# Patient Record
Sex: Female | Born: 1945
Health system: Southern US, Community
[De-identification: ages and names within clinical notes are randomized; demographics above are authoritative.]

## PROBLEM LIST (undated history)

## (undated) DIAGNOSIS — I10 Essential (primary) hypertension: Secondary | ICD-10-CM

## (undated) DIAGNOSIS — J45909 Unspecified asthma, uncomplicated: Secondary | ICD-10-CM

## (undated) DIAGNOSIS — I639 Cerebral infarction, unspecified: Secondary | ICD-10-CM

## (undated) DIAGNOSIS — E119 Type 2 diabetes mellitus without complications: Secondary | ICD-10-CM

## (undated) HISTORY — DX: Cerebral infarction, unspecified: I63.9

## (undated) HISTORY — DX: Type 2 diabetes mellitus without complications: E11.9

## (undated) HISTORY — DX: Unspecified asthma, uncomplicated: J45.909

## (undated) HISTORY — DX: Essential (primary) hypertension: I10

---

## 1998-06-16 HISTORY — PX: TOTAL HIP ARTHROPLASTY: SHX124

## 2000-06-16 HISTORY — PX: TOTAL HIP ARTHROPLASTY: SHX124

## 2004-05-16 ENCOUNTER — Ambulatory Visit: Payer: Self-pay

## 2009-03-16 ENCOUNTER — Ambulatory Visit: Payer: Self-pay | Admitting: Internal Medicine

## 2009-05-05 ENCOUNTER — Inpatient Hospital Stay: Payer: Self-pay | Admitting: Unknown Physician Specialty

## 2009-05-05 IMAGING — CR PELVIS - 1-2 VIEW
1 series · 2 of 2 positions shown · non-contrast
Comparison: none

REASON FOR EXAM: FALL, RIGHT HIP PAIN
COMMENTS:

PROCEDURE:     DXR - DXR PELVIS AP ONLY  - [DATE]  [DATE]
RESULT:     The patient is status subcapital femoral neck fracture on the
right. A left-sided prosthesis is appreciated. No further fractures or
dislocation are appreciated.

[Series 1: view not recorded · 0.17mm/px · 2 of 2 slices shown]
[im 1/2]
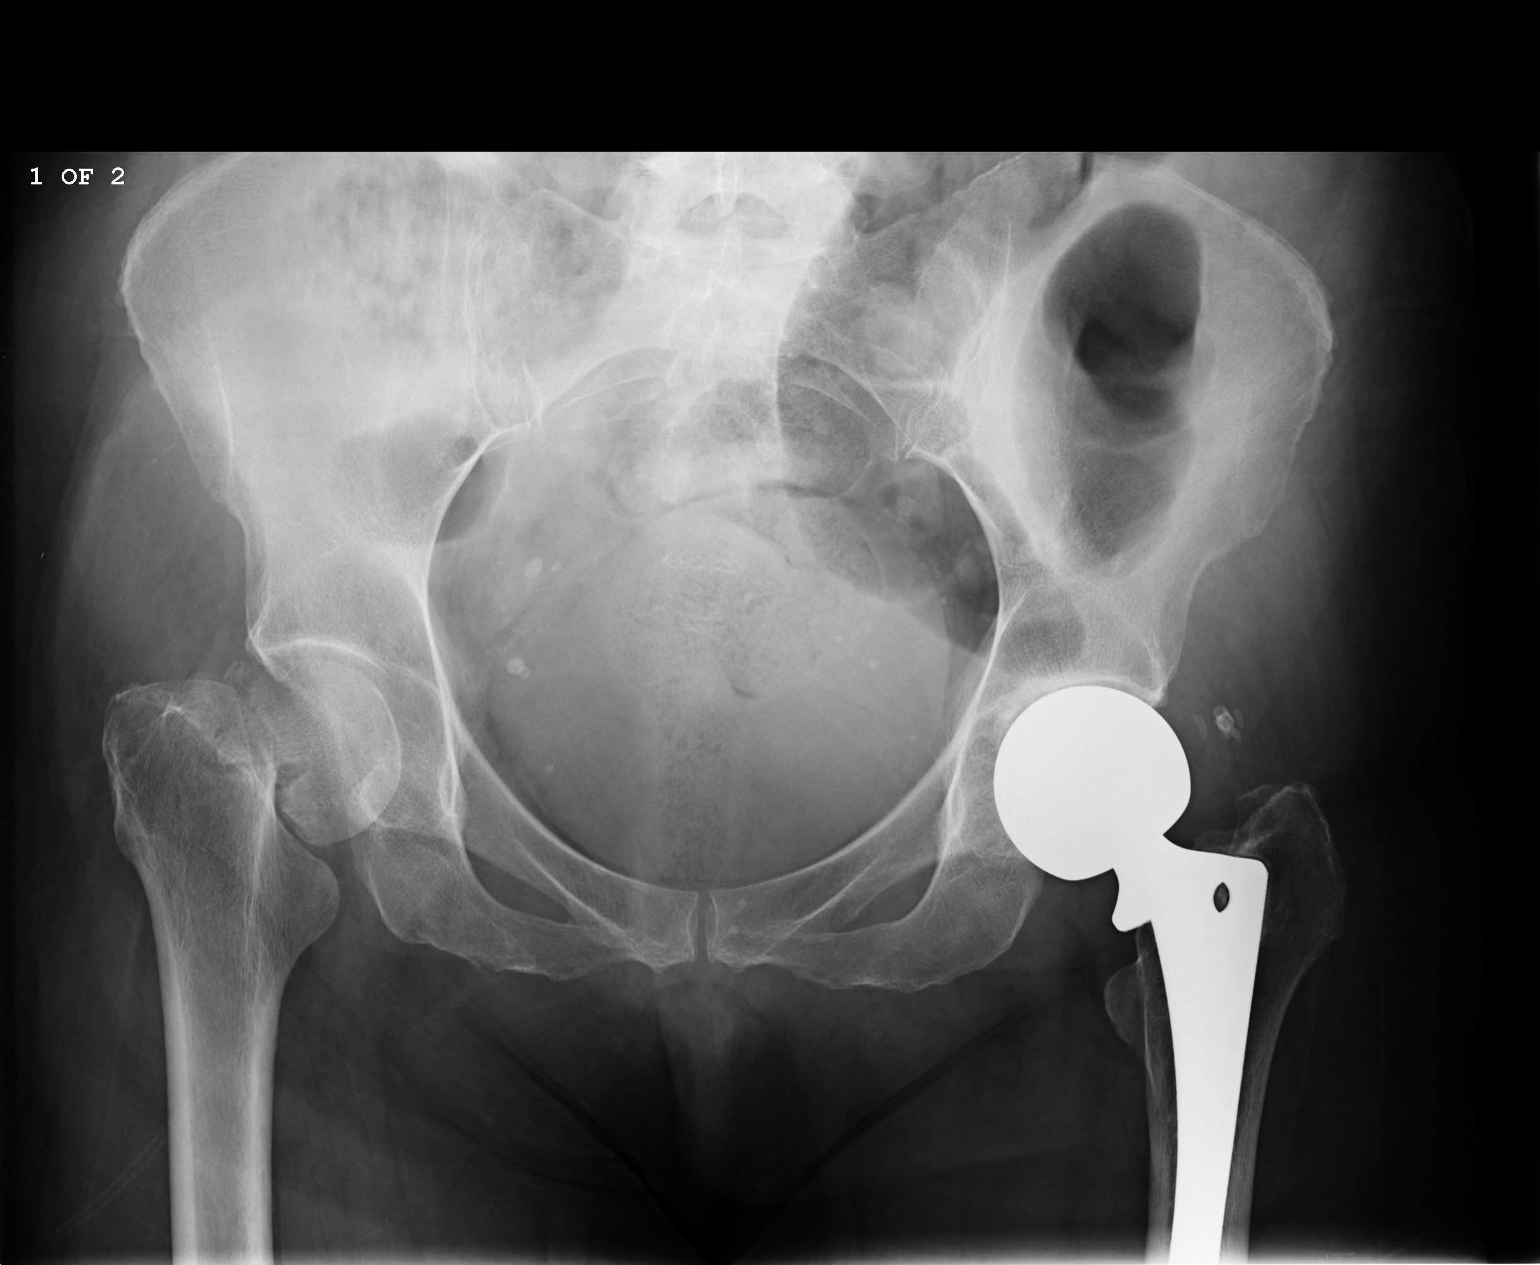
[im 2/2]
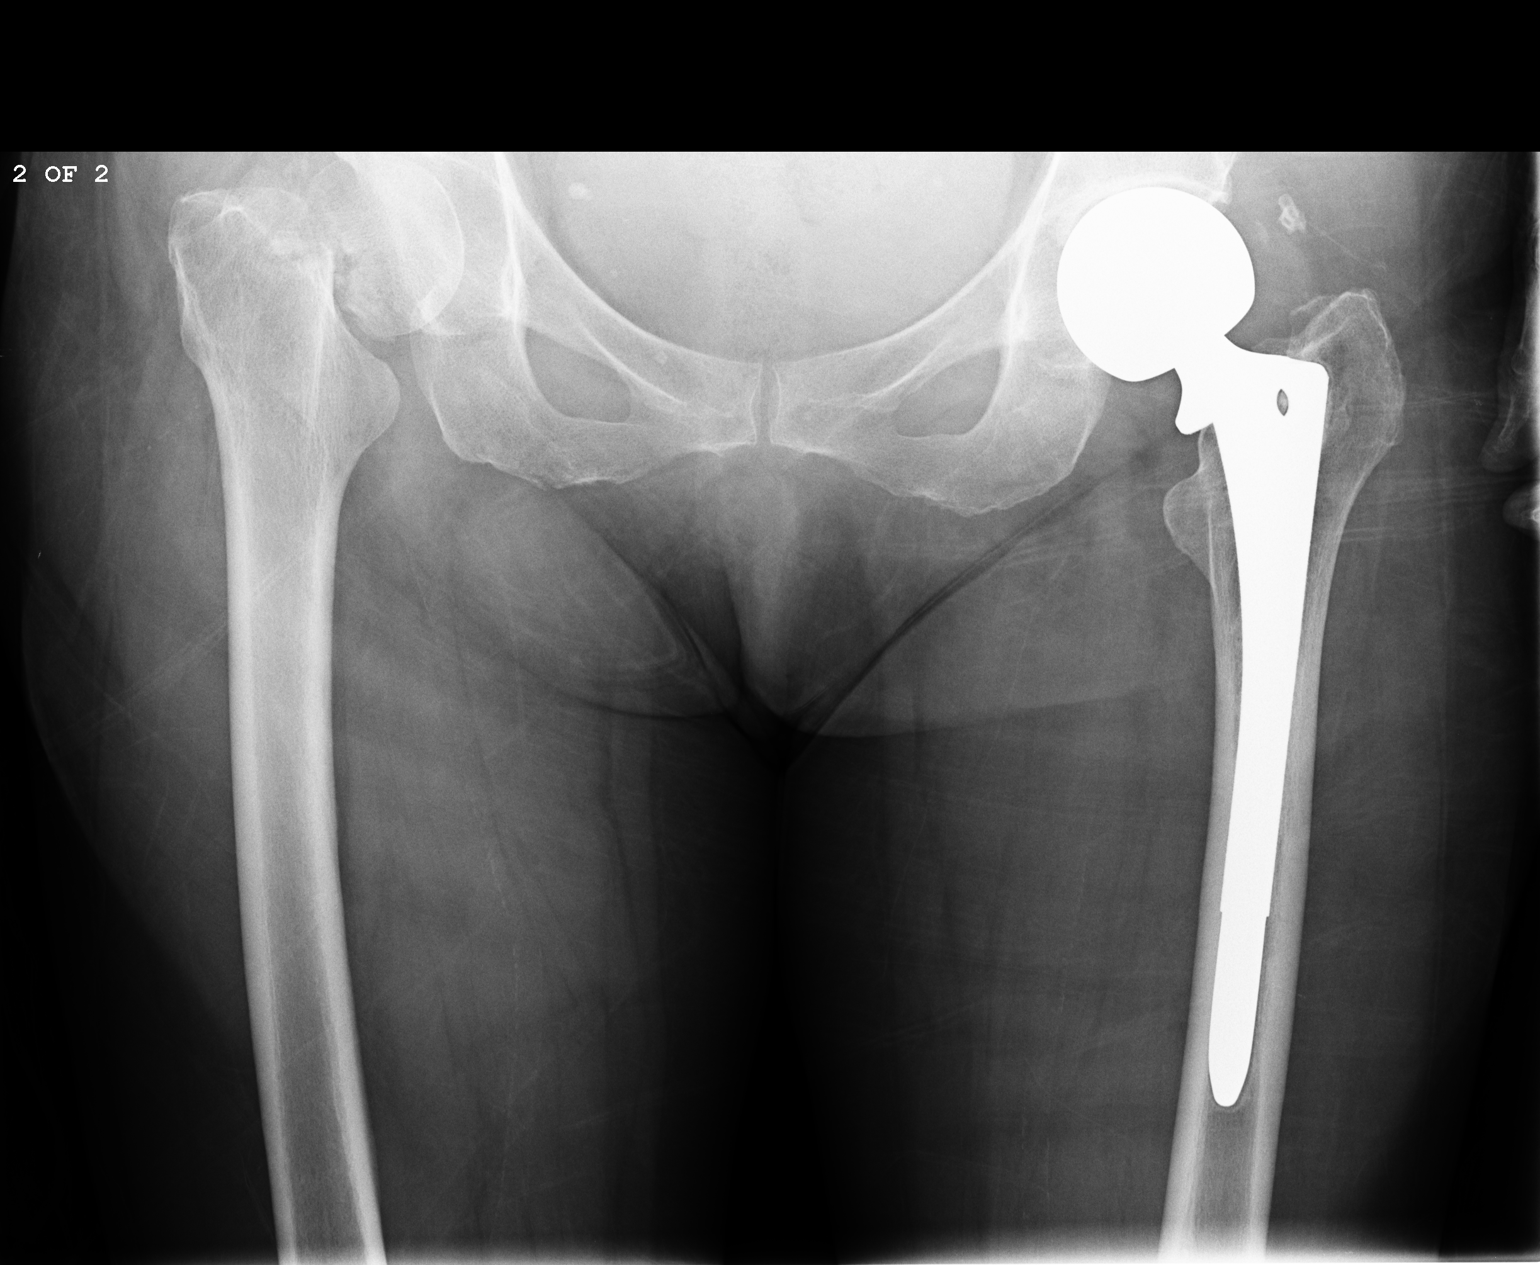

[2 of 2 positions shown; findings below may reference images not displayed]

IMPRESSION: Right subcapital femoral neck fracture as described above.

## 2009-05-05 IMAGING — CR DG CHEST 1V
1 series · 1 of 1 positions shown · non-contrast
Comparison: none

REASON FOR EXAM: FALL
COMMENTS:

PROCEDURE:     DXR - DXR CHEST 1 VIEWAP OR PA  - [DATE]  [DATE]
RESULT:
There is no evidence of focal infiltrates, effusion or edema. The cardiac
silhouette and visualized bony skeleton are unremarkable.

[view not recorded]
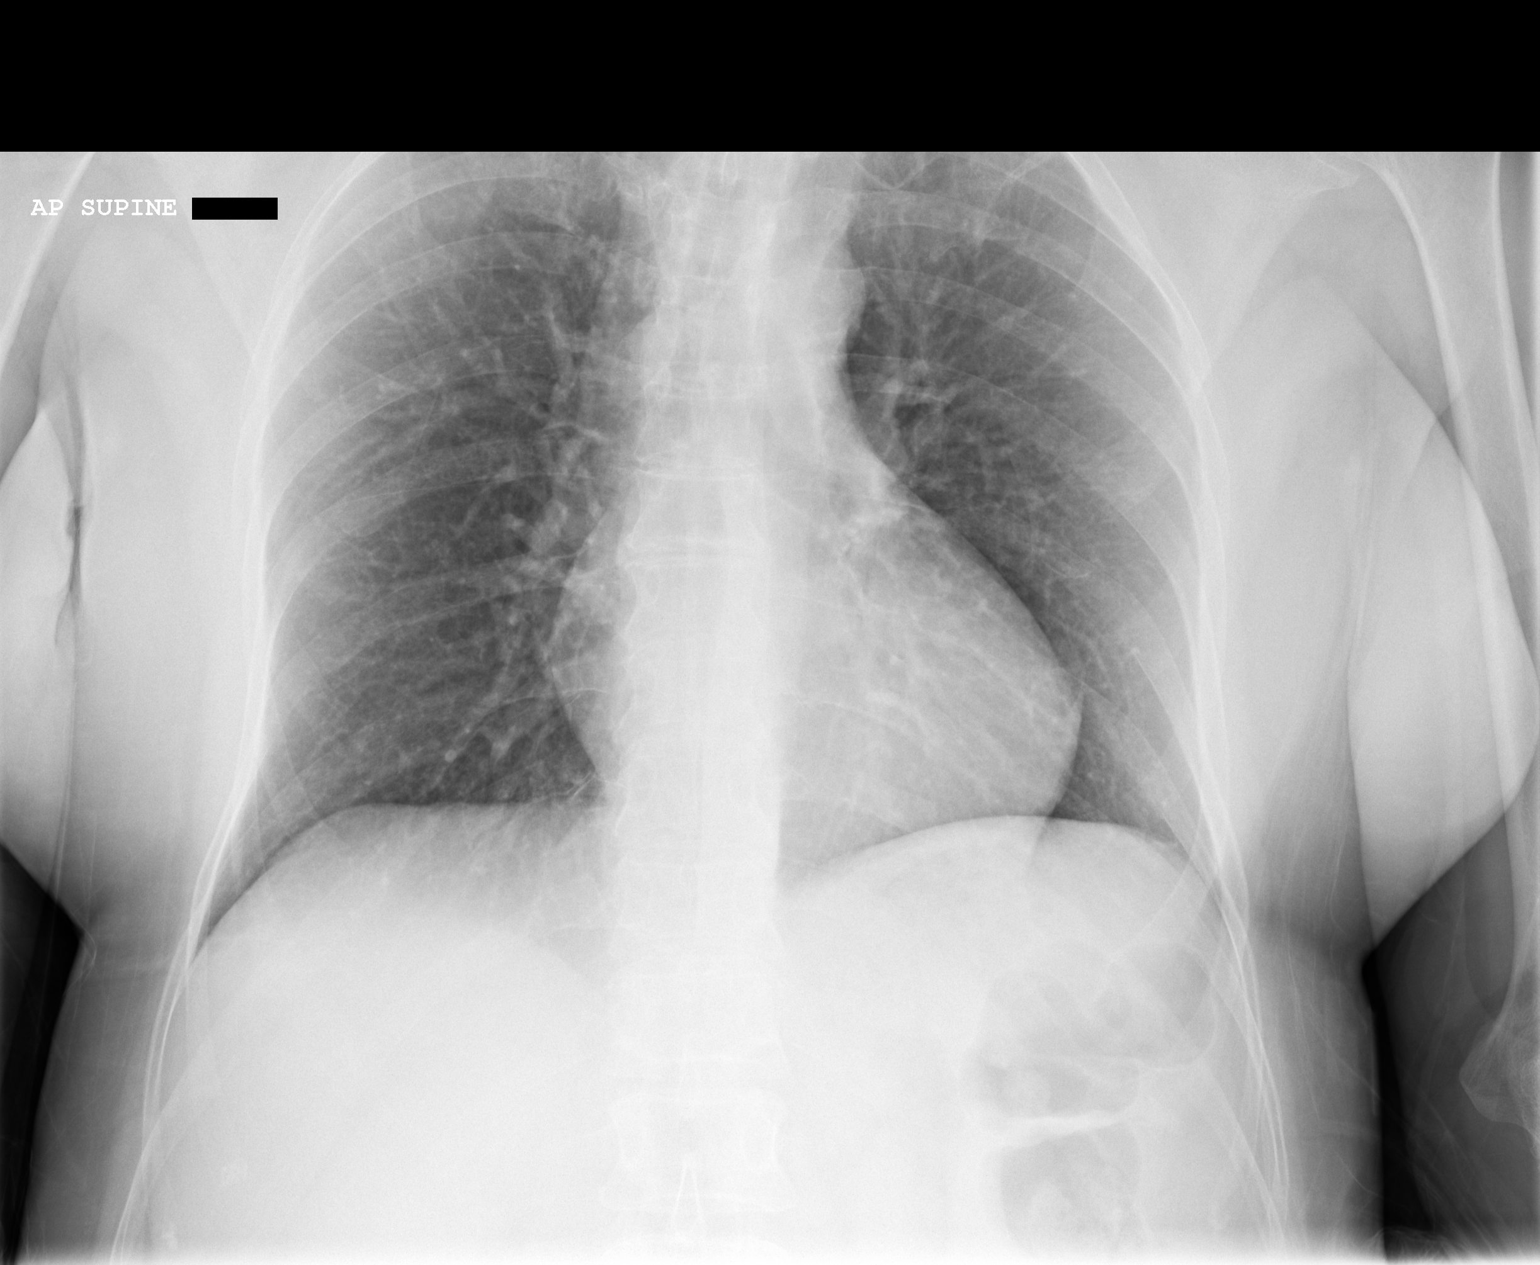

[1 of 1 positions shown; findings below may reference images not displayed]

IMPRESSION: Chest radiograph without evidence of acute cardiopulmonary disease.

## 2009-05-05 IMAGING — CR RIGHT HIP - COMPLETE 2+ VIEW
1 series · 2 of 2 positions shown · non-contrast
Comparison: none

REASON FOR EXAM: FALL, RIGHT HIP PAIN
COMMENTS:

PROCEDURE:     DXR - DXR HIP RIGHT COMPLETE  - [DATE]  [DATE]
RESULT:      A subcapital impacted femoral neck fracture is appreciated
demonstrated comminution and medial angulation of the distal fracture
fragment. There is superior displacement.

[Series 1: view not recorded · 0.17mm/px · 2 of 2 slices shown]
[im 1/2]
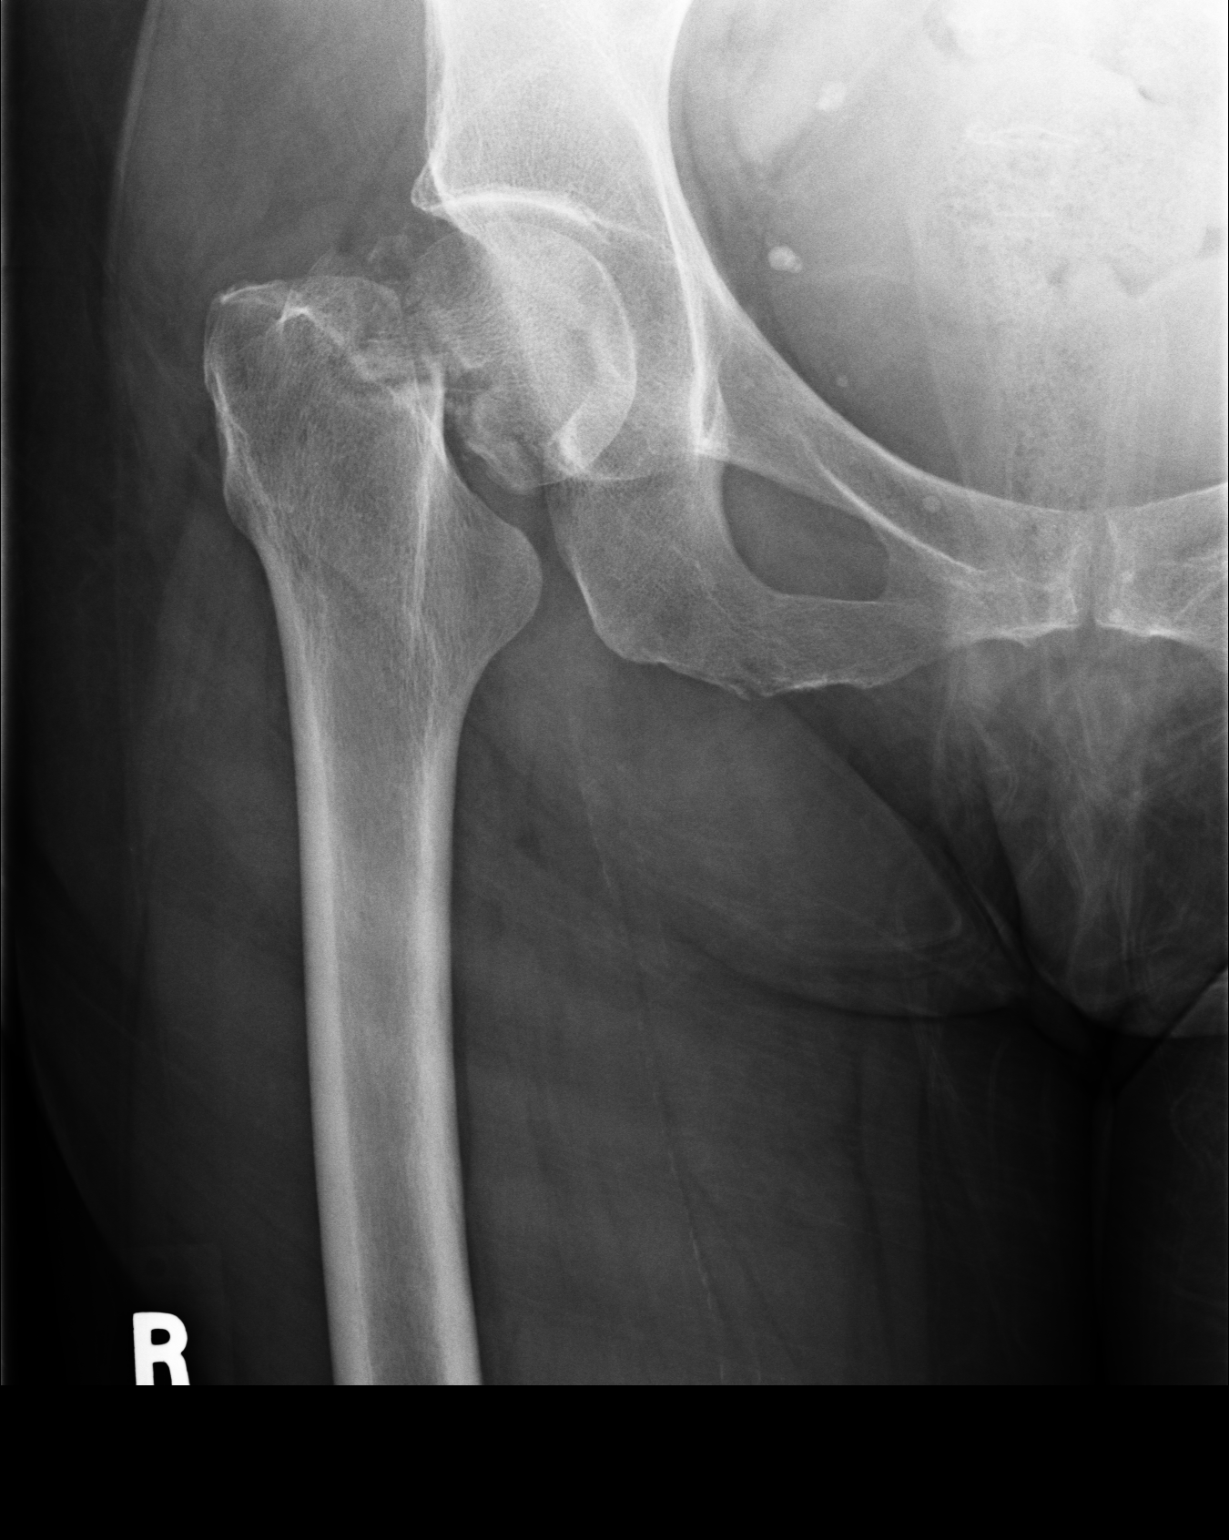
[im 2/2]
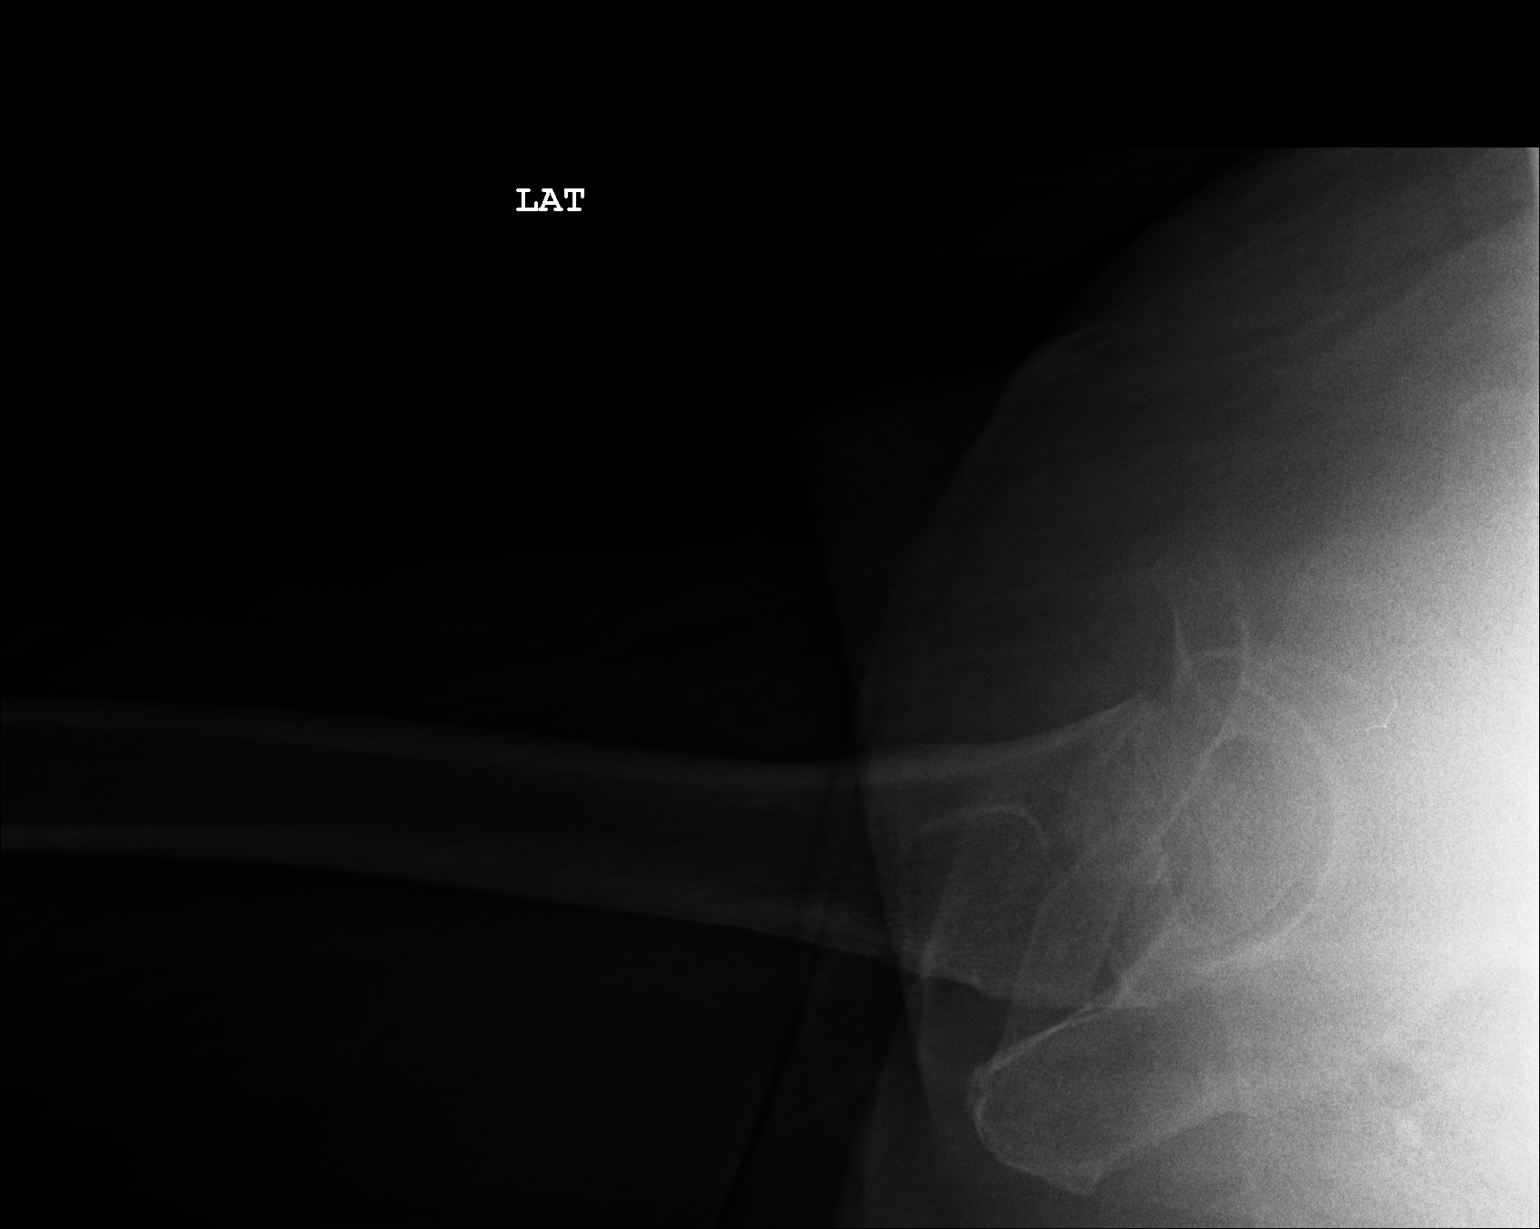

[2 of 2 positions shown; findings below may reference images not displayed]

IMPRESSION: Subcapital femoral neck fracture.

## 2009-05-06 IMAGING — CR PELVIS - 1-2 VIEW
1 series · 1 of 1 positions shown · non-contrast
Comparison: none

REASON FOR EXAM: Postop
COMMENTS:

[view not recorded]
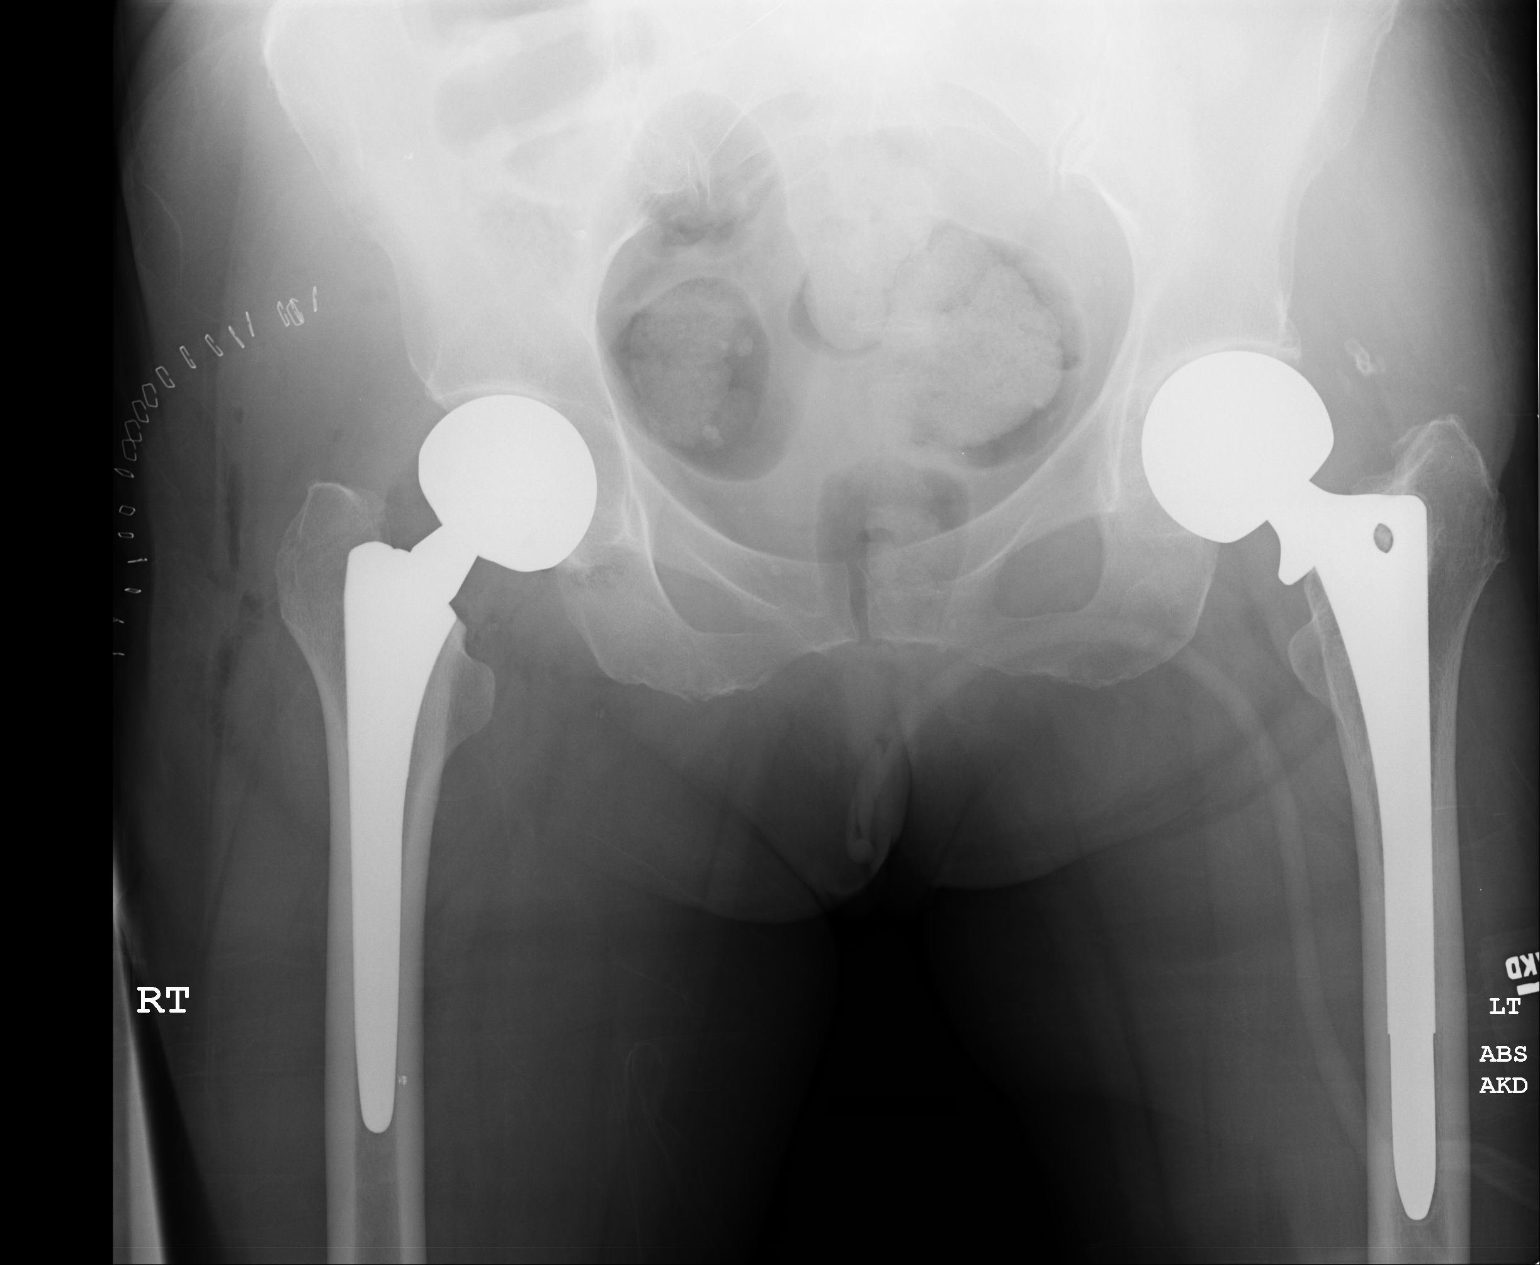

[1 of 1 positions shown; findings below may reference images not displayed]

PROCEDURE:     DXR - DXR PELVIS AP ONLY  - [DATE] [DATE]

RESULT:     Comparison is made to prior study dated [DATE].

The patient is status post right hip prostheses. Hardware appears intact
without evidence of loosening or failure. The native osseous structures
appear unremarkable. Skin staples are identified.
IMPRESSION: The patient is status post reduction/internal fixation of a right hip
fracture. The remainder of the interpretation will be left to the performing
physician.

## 2009-05-07 IMAGING — CR DG CHEST 2V
1 series · 2 of 2 positions shown · non-contrast
Comparison: none

REASON FOR EXAM: fever
COMMENTS:

[Series 1: view not recorded · 0.17mm/px · 2 of 2 slices shown]
[im 1/2]
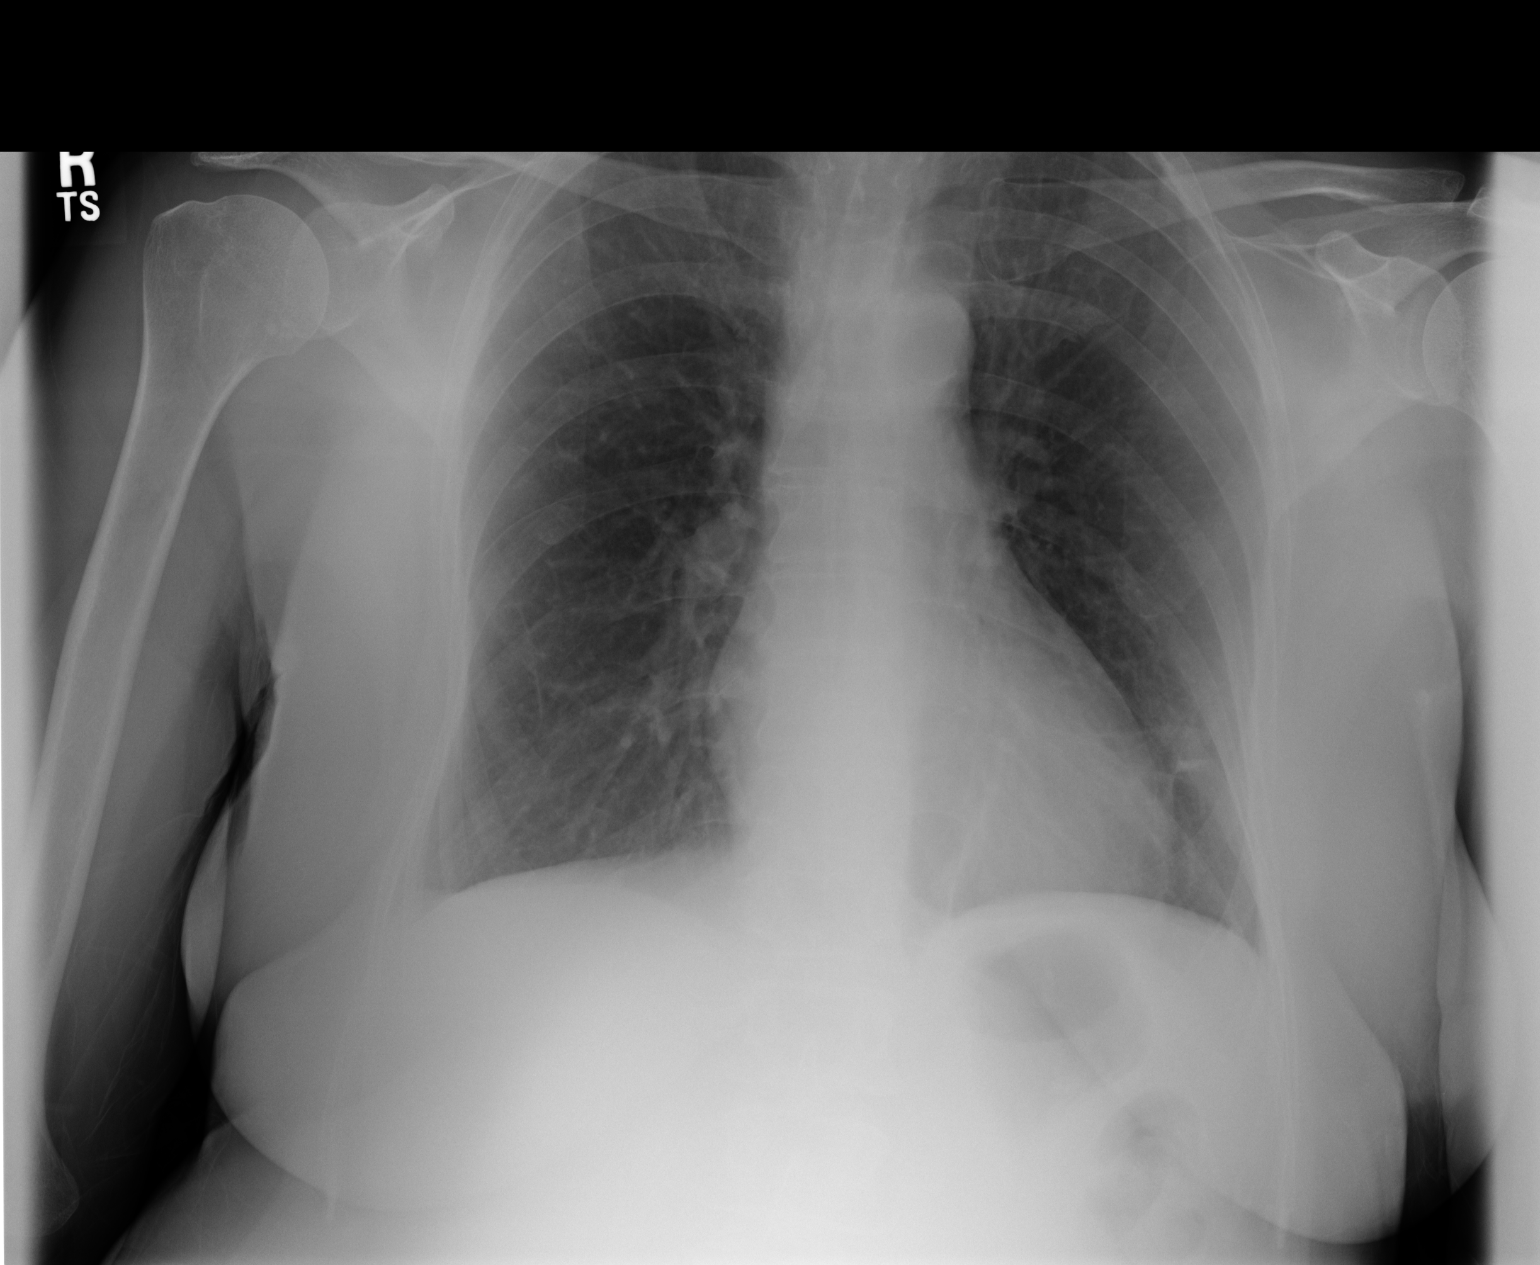
[im 2/2]
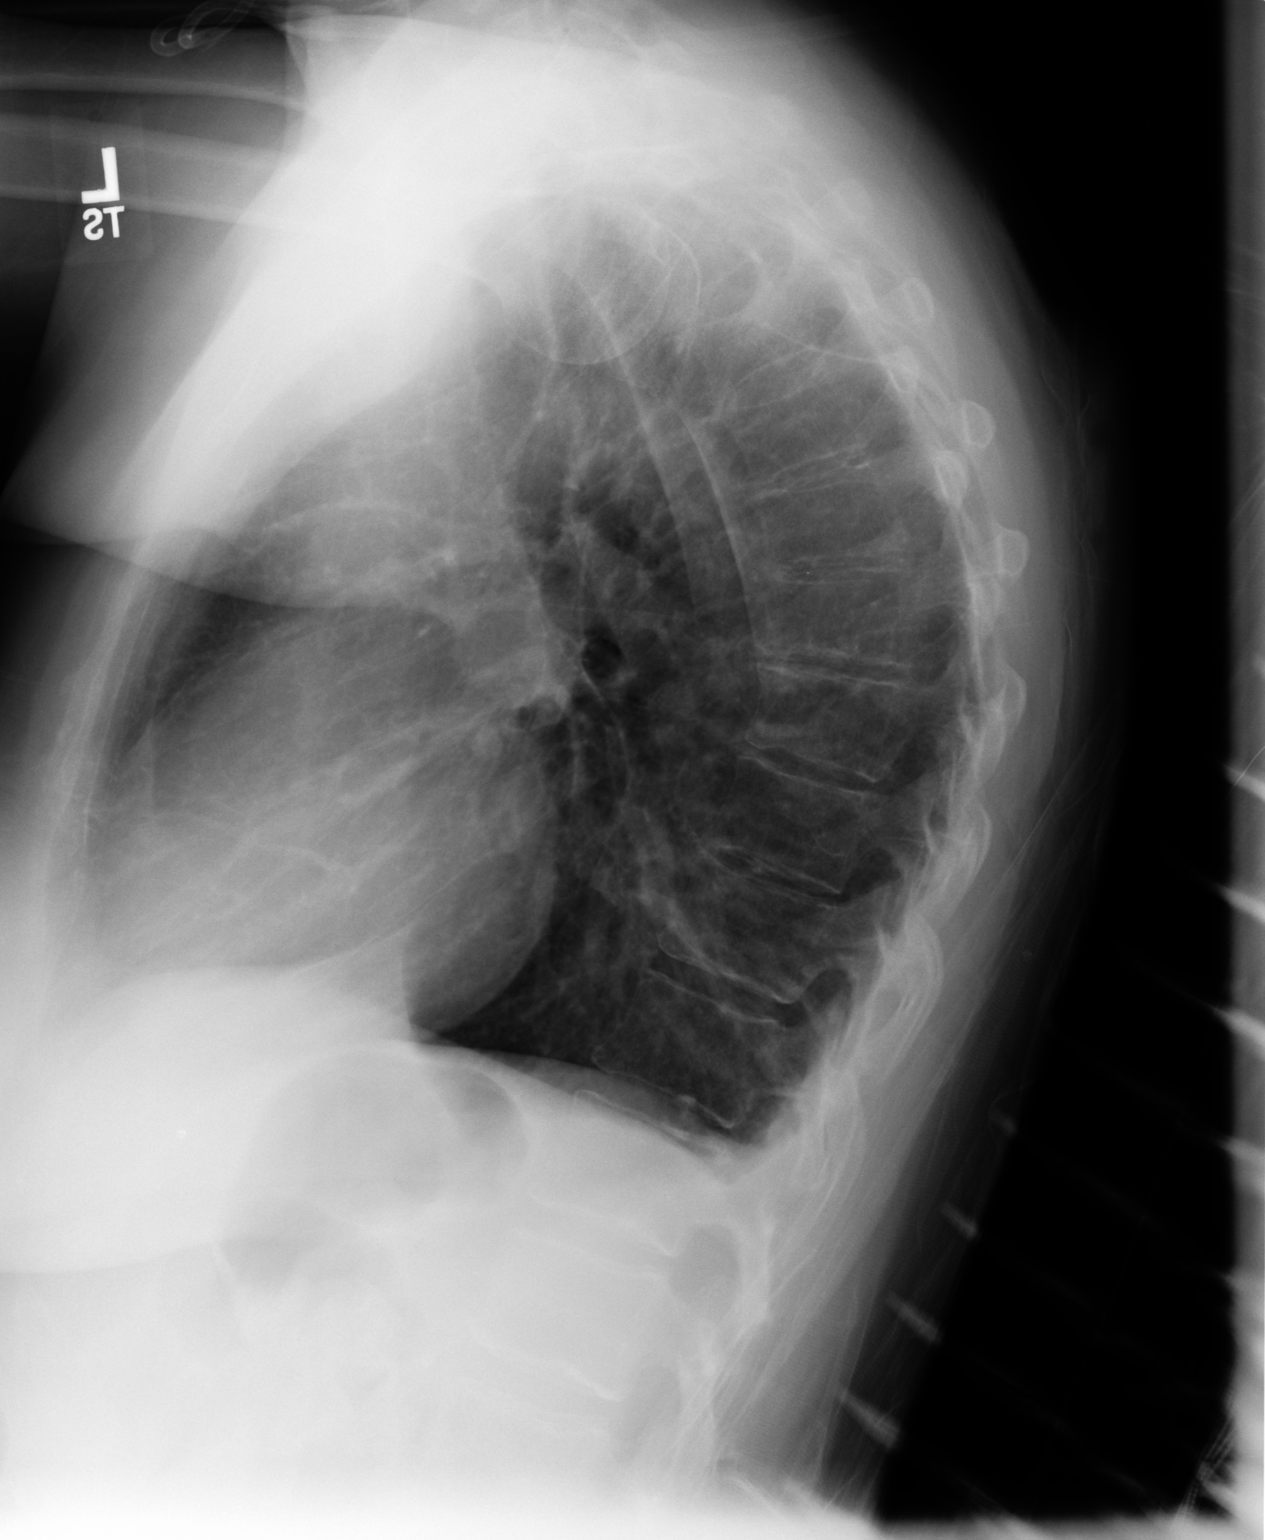

[2 of 2 positions shown; findings below may reference images not displayed]

PROCEDURE:     DXR - DXR CHEST PA (OR AP) AND LATERAL  - [DATE] [DATE]

RESULT:     Comparison is made to a prior exam of [DATE].

There is very slight discoid atelectasis at the left lower lobe. The lung
fields otherwise are clear. No pneumonia, pneumothorax or pleural effusion
is seen. The heart size is normal. The mediastinal and osseous structures
show no significant abnormalities.
IMPRESSION: There is very slight discoid atelectasis at the left base.
The lung fields otherwise are clear.

## 2009-05-09 ENCOUNTER — Encounter: Payer: Self-pay | Admitting: Internal Medicine

## 2009-05-16 ENCOUNTER — Encounter: Payer: Self-pay | Admitting: Internal Medicine

## 2009-09-03 ENCOUNTER — Ambulatory Visit: Payer: Self-pay | Admitting: Gastroenterology

## 2010-05-14 ENCOUNTER — Ambulatory Visit: Payer: Self-pay | Admitting: Internal Medicine

## 2011-03-04 ENCOUNTER — Ambulatory Visit: Payer: Self-pay | Admitting: Internal Medicine

## 2011-03-17 ENCOUNTER — Ambulatory Visit: Payer: Self-pay | Admitting: Internal Medicine

## 2011-03-27 ENCOUNTER — Other Ambulatory Visit: Payer: Self-pay | Admitting: Gastroenterology

## 2011-04-02 ENCOUNTER — Ambulatory Visit: Payer: Self-pay | Admitting: Gastroenterology

## 2011-04-02 IMAGING — RF DG SMALL BOWEL
1 series · 4 of 4 positions shown · non-contrast
Comparison: none

REASON FOR EXAM: iron deficiency anemia
COMMENTS:

PROCEDURE:     FL  - FL SMALL BOWEL  - [DATE]  [DATE]
RESULT:     Indication: Iron deficiency anemia

[Series 1: run · 4 of 4 slices shown]
[im 1/4]
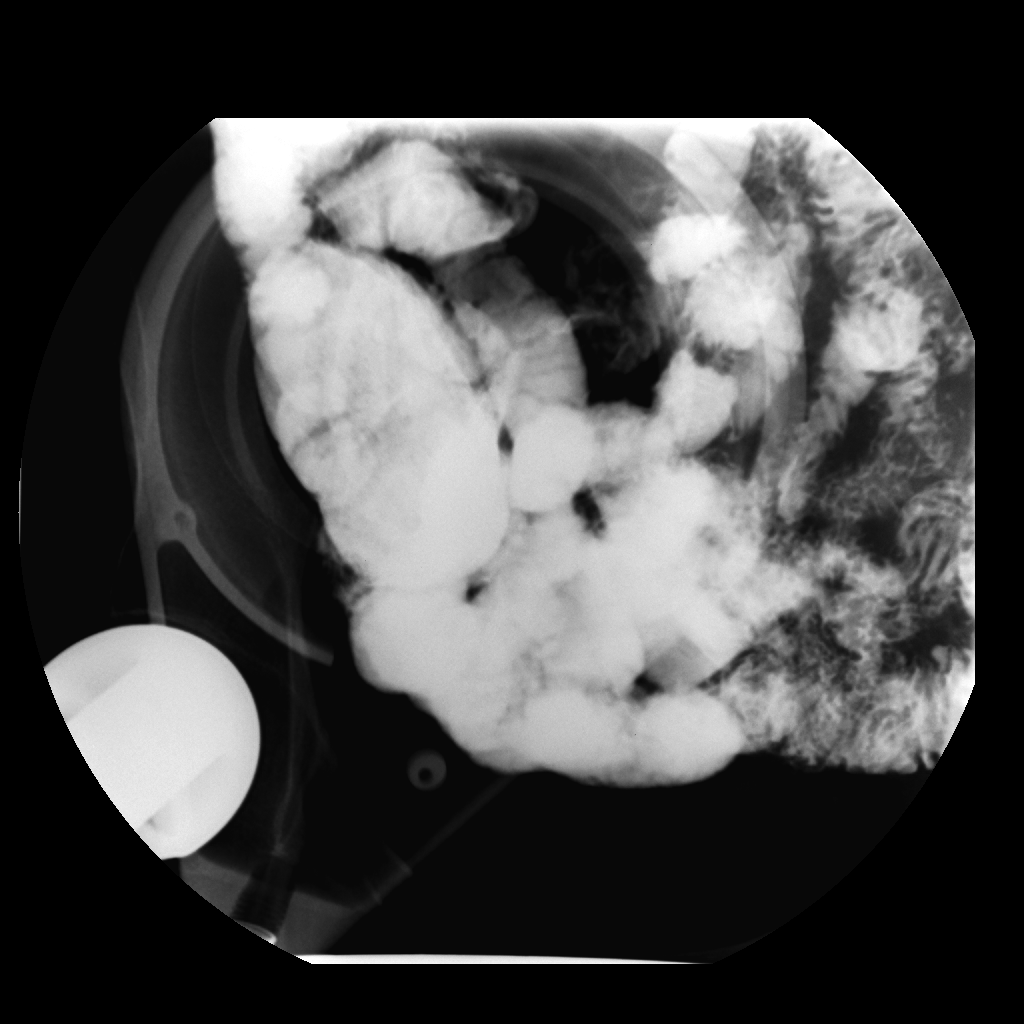
[im 2/4]
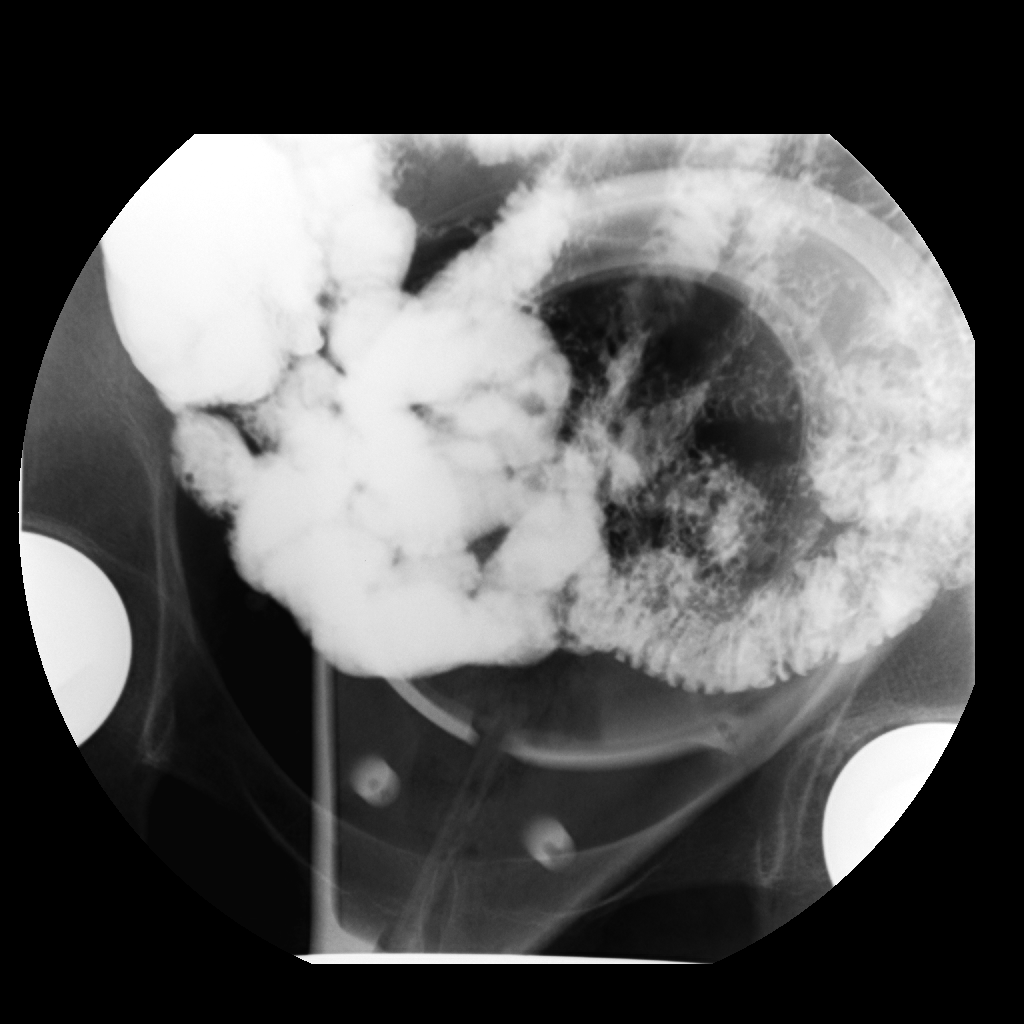
[im 3/4]
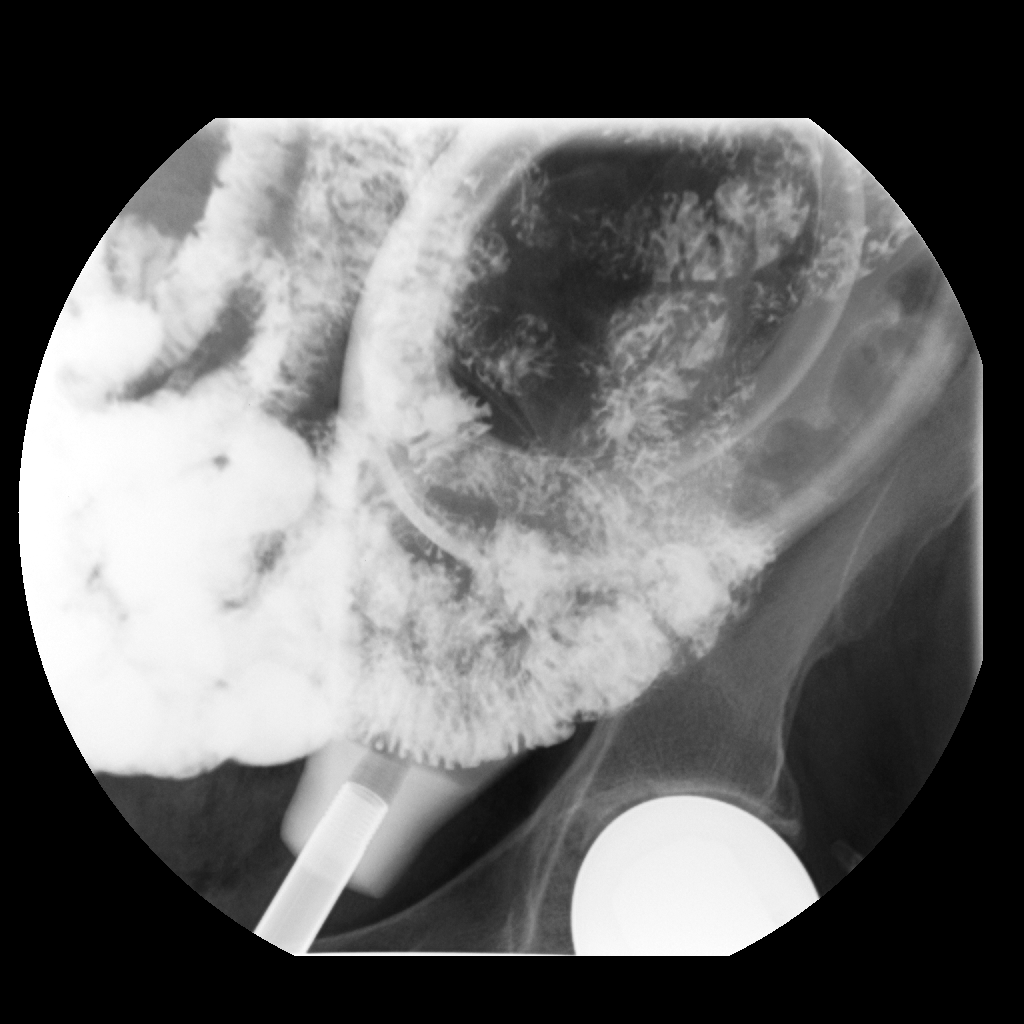
[im 4/4]
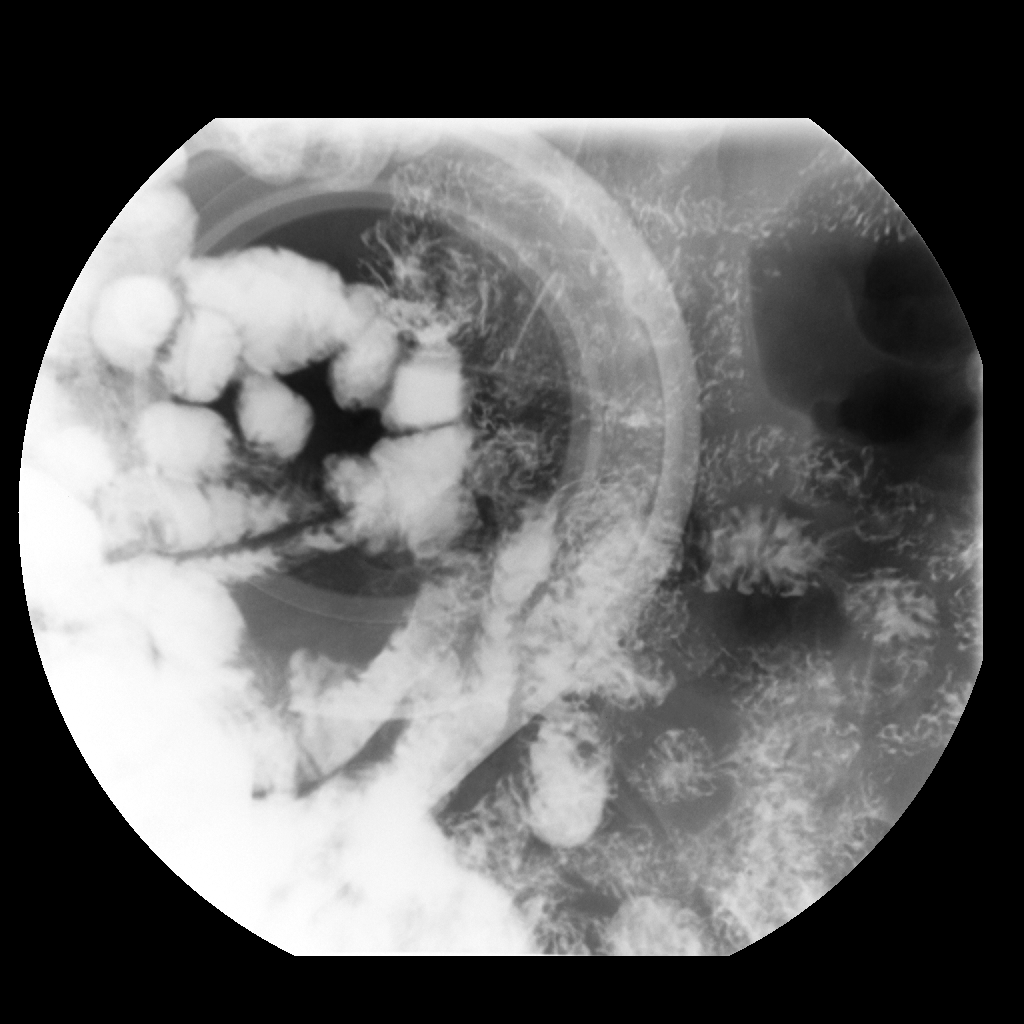

[4 of 4 positions shown; findings below may reference images not displayed]

FINDINGS: Scout frontal abdominal and pelvic radiograph demonstrates no focal
abnormality.

Medium density barium was periodically observed under fluoroscopy to travel
from the stomach to the ascending colon (over a 30 minute time period).
There is no evidence of small bowel stricture or obstruction. No large
filling defects to suggest mass lesion. In addition, there is no evidence of
tethering or definite inflammatory changes present within the small bowel.
IMPRESSION: Normal small bowel follow-through study without evidence of tethering, mass
lesion, or obstruction within the small bowel.

## 2011-04-02 IMAGING — CR DG SMALL BOWEL
1 series · 4 of 4 positions shown · non-contrast
Comparison: none

REASON FOR EXAM: iron deficiency anemia
COMMENTS:

PROCEDURE:     FL  - FL SMALL BOWEL  - [DATE]  [DATE]
RESULT:     Indication: Iron deficiency anemia

[Series 1: view not recorded · 0.17mm/px · 4 of 4 slices shown]
[im 1/4]
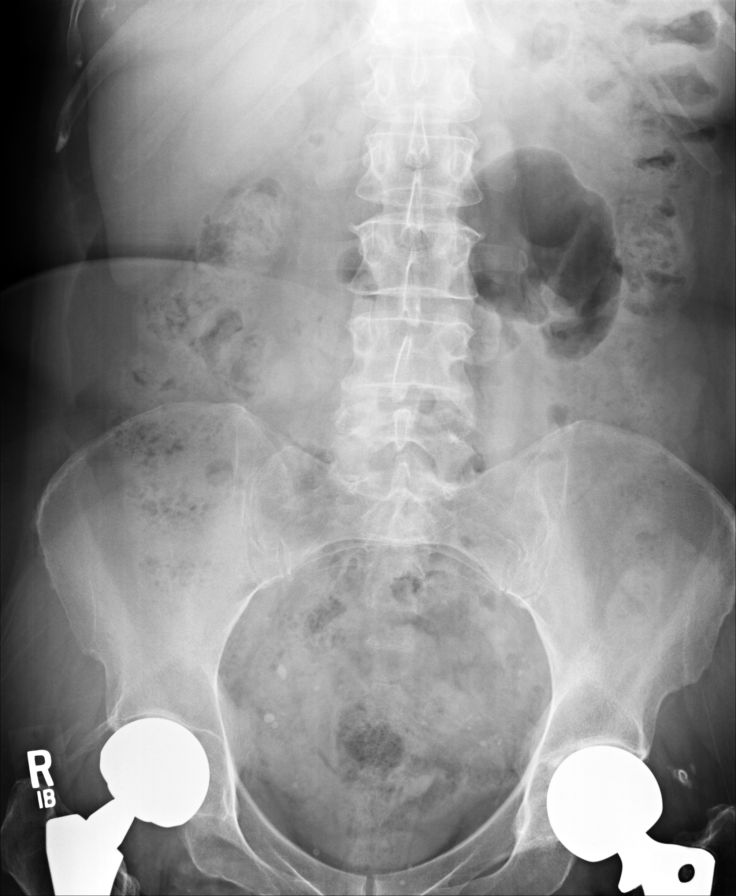
[im 2/4]
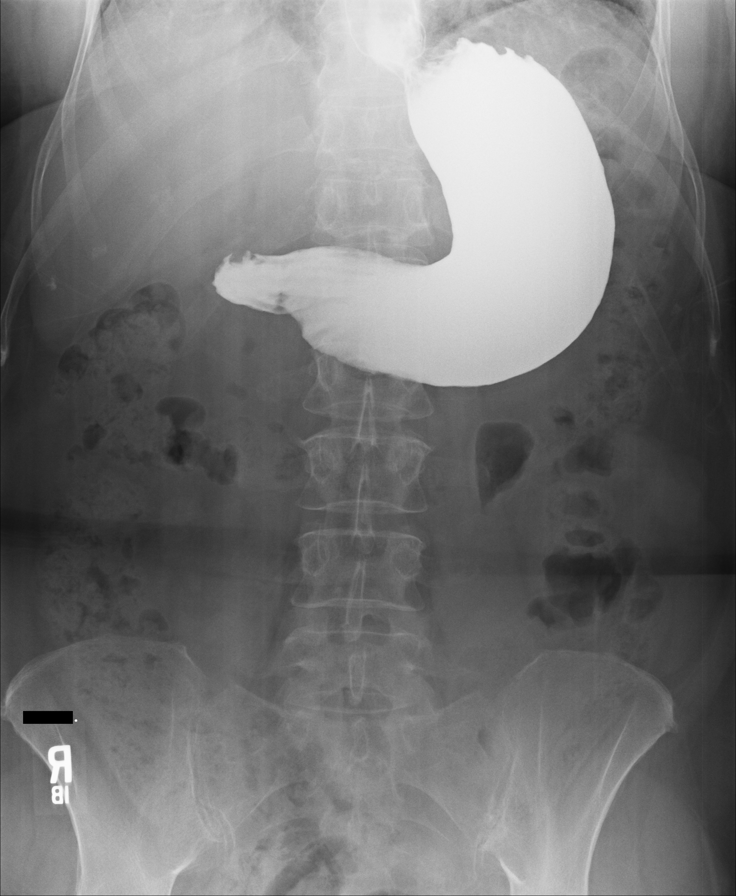
[im 3/4]
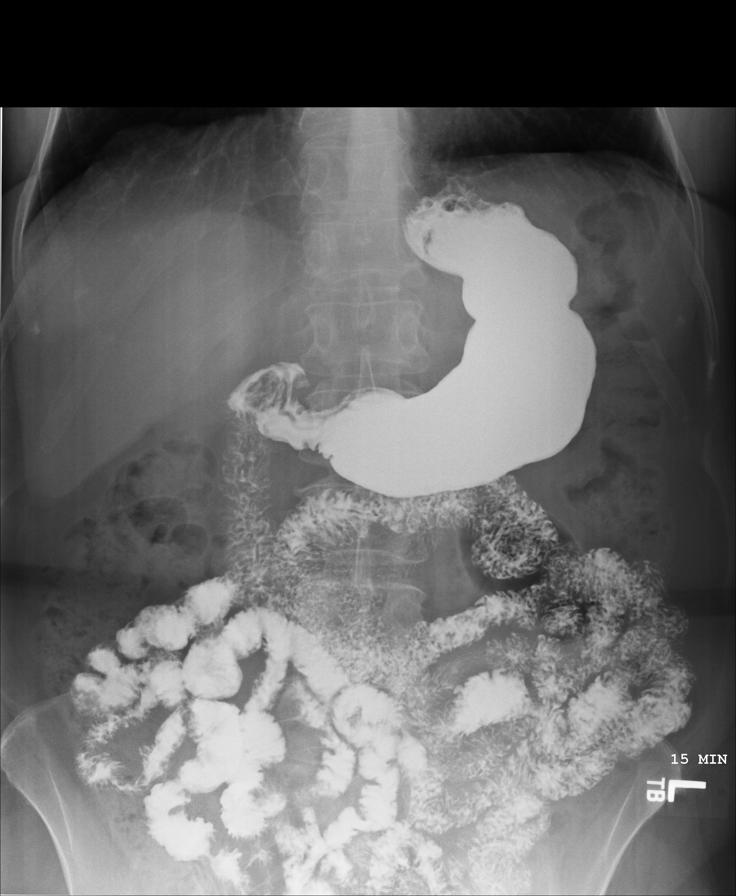
[im 4/4]
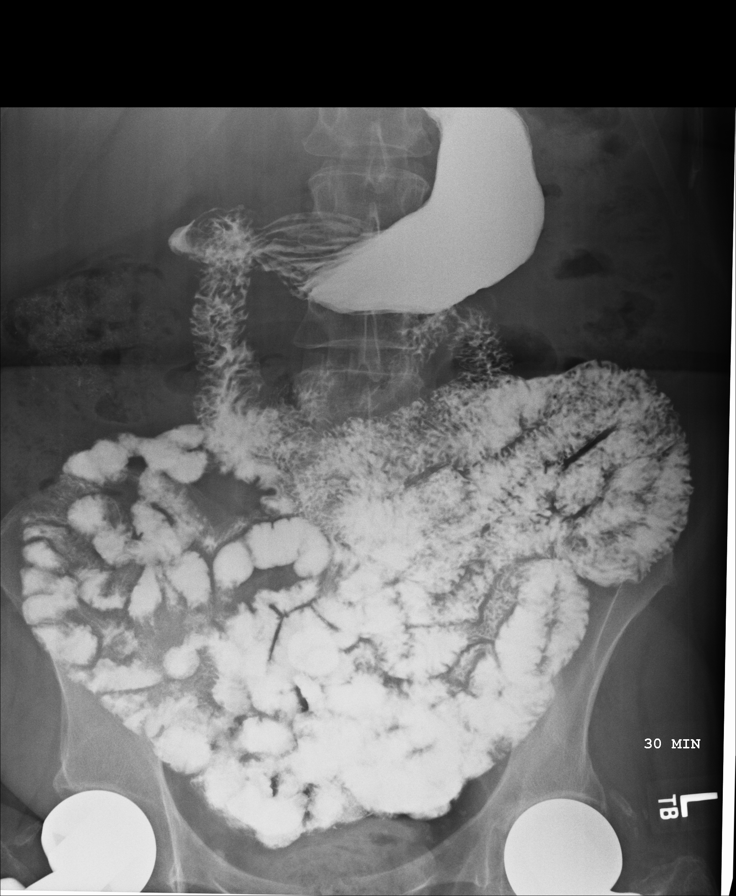

[4 of 4 positions shown; findings below may reference images not displayed]

FINDINGS: Scout frontal abdominal and pelvic radiograph demonstrates no focal
abnormality.

Medium density barium was periodically observed under fluoroscopy to travel
from the stomach to the ascending colon (over a 30 minute time period).
There is no evidence of small bowel stricture or obstruction. No large
filling defects to suggest mass lesion. In addition, there is no evidence of
tethering or definite inflammatory changes present within the small bowel.
IMPRESSION: Normal small bowel follow-through study without evidence of tethering, mass
lesion, or obstruction within the small bowel.

## 2011-04-17 ENCOUNTER — Ambulatory Visit: Payer: Self-pay | Admitting: Internal Medicine

## 2011-05-20 ENCOUNTER — Ambulatory Visit: Payer: Self-pay | Admitting: Internal Medicine

## 2011-06-17 ENCOUNTER — Ambulatory Visit: Payer: Self-pay | Admitting: Internal Medicine

## 2011-06-24 ENCOUNTER — Ambulatory Visit: Payer: Self-pay | Admitting: Internal Medicine

## 2011-07-01 LAB — CBC CANCER CENTER
Basophil #: 0 x10 3/mm (ref 0.0–0.1)
Eosinophil %: 3.4 %
HGB: 11.8 g/dL — ABNORMAL LOW (ref 12.0–16.0)
Lymphocyte #: 1.3 x10 3/mm (ref 1.0–3.6)
MCH: 30.7 pg (ref 26.0–34.0)
MCHC: 33.3 g/dL (ref 32.0–36.0)
MCV: 92 fL (ref 80–100)
Monocyte #: 0.7 x10 3/mm (ref 0.0–0.7)
Monocyte %: 9.3 %
Platelet: 252 x10 3/mm (ref 150–440)
RBC: 3.85 10*6/uL (ref 3.80–5.20)
WBC: 7.7 x10 3/mm (ref 3.6–11.0)

## 2011-07-01 LAB — IRON AND TIBC
Iron Bind.Cap.(Total): 297 ug/dL (ref 250–450)
Iron: 44 ug/dL — ABNORMAL LOW (ref 50–170)
Unbound Iron-Bind.Cap.: 253 ug/dL

## 2011-07-01 LAB — FERRITIN: Ferritin (ARMC): 76 ng/mL (ref 8–388)

## 2011-07-18 ENCOUNTER — Ambulatory Visit: Payer: Self-pay | Admitting: Internal Medicine

## 2011-09-30 ENCOUNTER — Ambulatory Visit: Payer: Self-pay | Admitting: Ophthalmology

## 2011-09-30 LAB — POTASSIUM: Potassium: 4.3 mmol/L (ref 3.5–5.1)

## 2011-10-14 ENCOUNTER — Ambulatory Visit: Payer: Self-pay | Admitting: Ophthalmology

## 2011-12-03 ENCOUNTER — Ambulatory Visit: Payer: Self-pay | Admitting: Ophthalmology

## 2012-06-28 ENCOUNTER — Ambulatory Visit: Payer: Self-pay | Admitting: Internal Medicine

## 2013-06-14 ENCOUNTER — Ambulatory Visit: Payer: Self-pay | Admitting: Family

## 2013-06-14 IMAGING — CR DG LUMBAR SPINE COMPLETE 4+V
1 series · 5 of 5 positions shown · non-contrast
Comparison: None

CLINICAL DATA: Low back pain for 1 month

EXAM:
LUMBAR SPINE - COMPLETE 4+ VIEW

[Series 1: ap · 0.17mm/px · 5 of 5 slices shown]
[im 1/5]
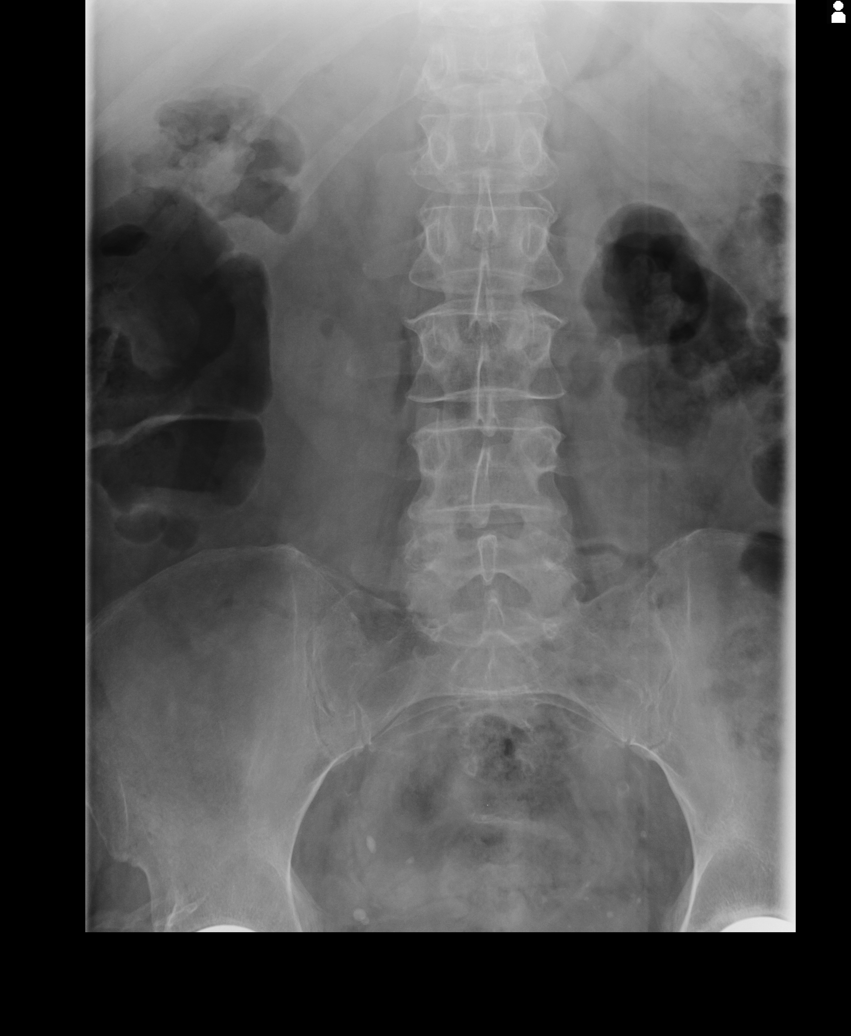
[im 2/5]
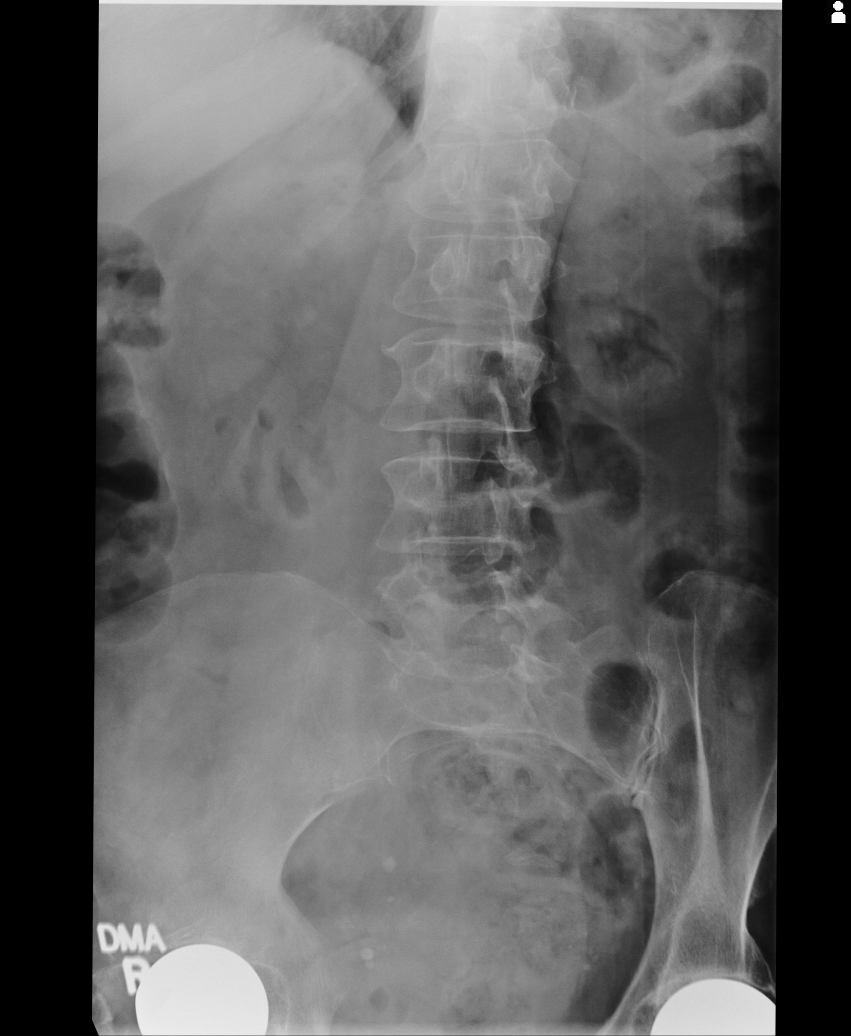
[im 3/5]
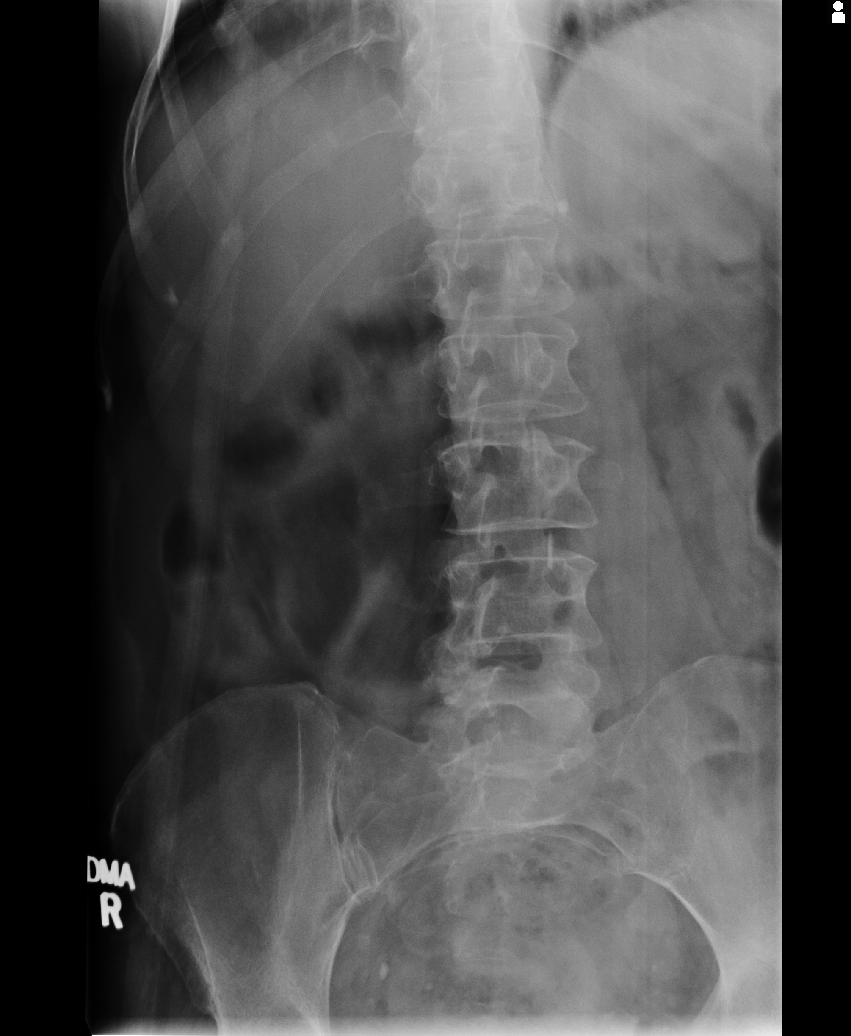
[im 4/5]
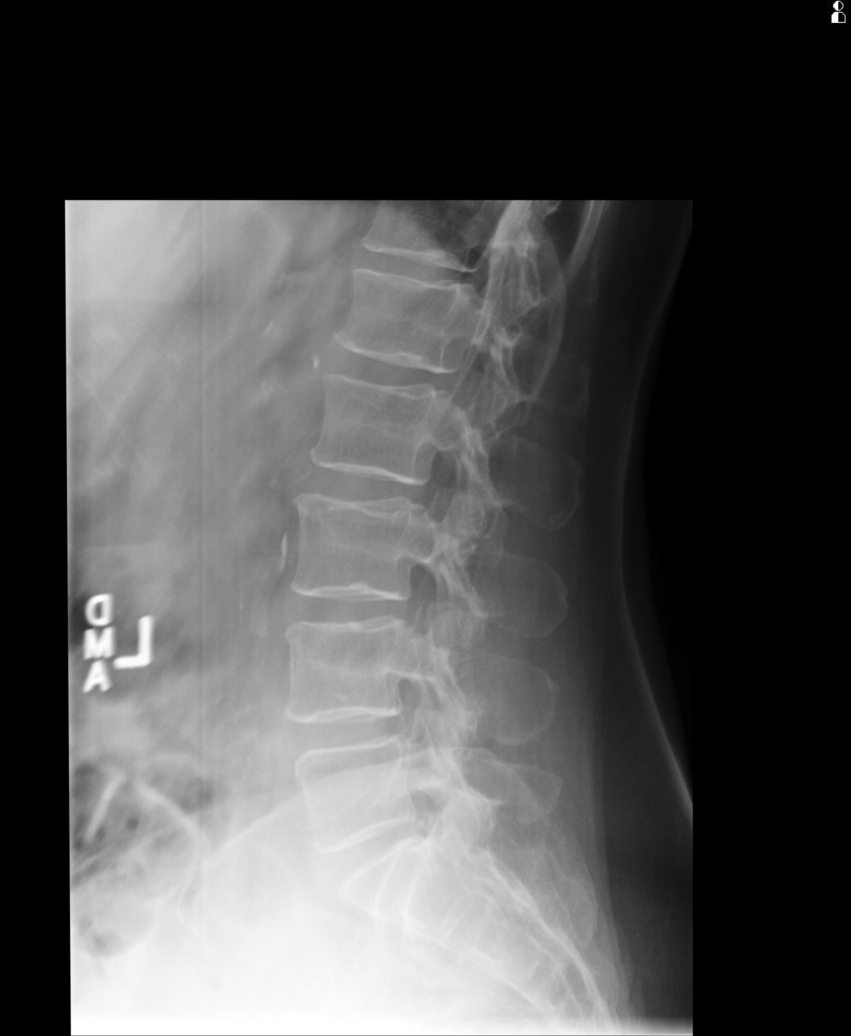
[im 5/5]
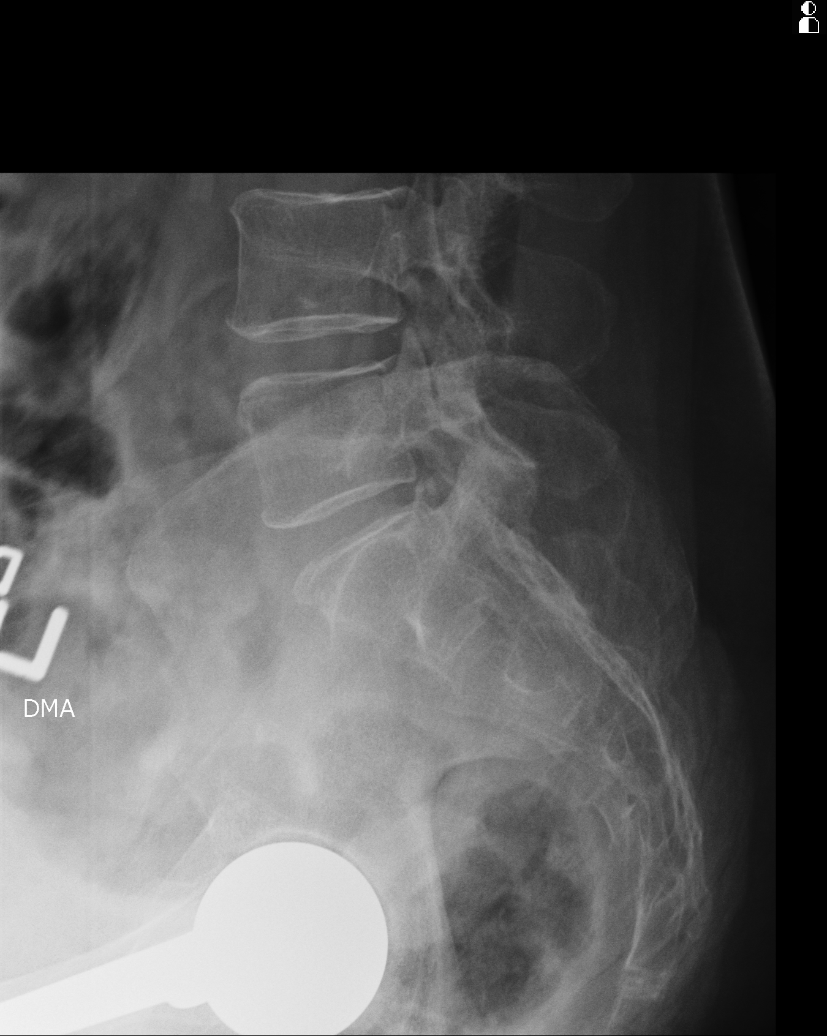

[5 of 5 positions shown; findings below may reference images not displayed]

FINDINGS: There are 5 nonrib bearing lumbar-type vertebral bodies. The
vertebral body heights are maintained. The alignment is anatomic.
There is no spondylolysis. There is no acute fracture or static
listhesis. The disc spaces are maintained.

The SI joints are unremarkable. There are bilateral hip
arthroplasties.

There is abdominal aortic atherosclerosis.
IMPRESSION: No acute osseous injury of the lumbar spine.

## 2013-06-30 ENCOUNTER — Ambulatory Visit: Payer: Self-pay | Admitting: Family

## 2013-10-11 ENCOUNTER — Ambulatory Visit: Payer: Self-pay | Admitting: Pain Medicine

## 2013-10-19 ENCOUNTER — Ambulatory Visit: Payer: Self-pay | Admitting: Pain Medicine

## 2013-11-15 ENCOUNTER — Ambulatory Visit: Payer: Self-pay | Admitting: Pain Medicine

## 2013-11-21 ENCOUNTER — Ambulatory Visit: Payer: Self-pay | Admitting: Pain Medicine

## 2013-12-13 ENCOUNTER — Ambulatory Visit: Payer: Self-pay | Admitting: Pain Medicine

## 2014-05-03 ENCOUNTER — Observation Stay: Payer: Self-pay | Admitting: Internal Medicine

## 2014-05-03 LAB — BASIC METABOLIC PANEL
Anion Gap: 7 (ref 7–16)
BUN: 31 mg/dL — ABNORMAL HIGH (ref 7–18)
CO2: 26 mmol/L (ref 21–32)
Calcium, Total: 8.6 mg/dL (ref 8.5–10.1)
Chloride: 103 mmol/L (ref 98–107)
Creatinine: 1.8 mg/dL — ABNORMAL HIGH (ref 0.60–1.30)
EGFR (African American): 36 — ABNORMAL LOW
EGFR (Non-African Amer.): 30 — ABNORMAL LOW
GLUCOSE: 439 mg/dL — AB (ref 65–99)
Osmolality: 297 (ref 275–301)
Potassium: 4.9 mmol/L (ref 3.5–5.1)
Sodium: 136 mmol/L (ref 136–145)

## 2014-05-03 LAB — URINALYSIS, COMPLETE
Bilirubin,UR: NEGATIVE
Blood: NEGATIVE
Glucose,UR: >=500 mg/dL (ref 0–75)
Ketone: NEGATIVE
NITRITE: NEGATIVE
PH: 5 (ref 4.5–8.0)
Protein: NEGATIVE
Specific Gravity: 1.02 (ref 1.003–1.030)
Squamous Epithelial: <1
WBC UR: 152 /HPF (ref 0–5)

## 2014-05-03 LAB — CBC
HCT: 30.9 % — ABNORMAL LOW (ref 35.0–47.0)
HGB: 10 g/dL — AB (ref 12.0–16.0)
MCH: 28.8 pg (ref 26.0–34.0)
MCHC: 32.5 g/dL (ref 32.0–36.0)
MCV: 89 fL (ref 80–100)
PLATELETS: 235 10*3/uL (ref 150–440)
RBC: 3.48 10*6/uL — ABNORMAL LOW (ref 3.80–5.20)
RDW: 14.3 % (ref 11.5–14.5)
WBC: 8.7 10*3/uL (ref 3.6–11.0)

## 2014-05-03 IMAGING — CR DG HIP COMPLETE 2+V*L*
1 series · 3 of 3 positions shown · non-contrast
Comparison: Radiographs dated [DATE] and [DATE]

CLINICAL DATA: Left hip pain.

EXAM:
LEFT HIP - COMPLETE 2+ VIEW

[Series 1: dxr hip left complete · 0.14mm/px · 3 of 3 slices shown]
[im 1/3]
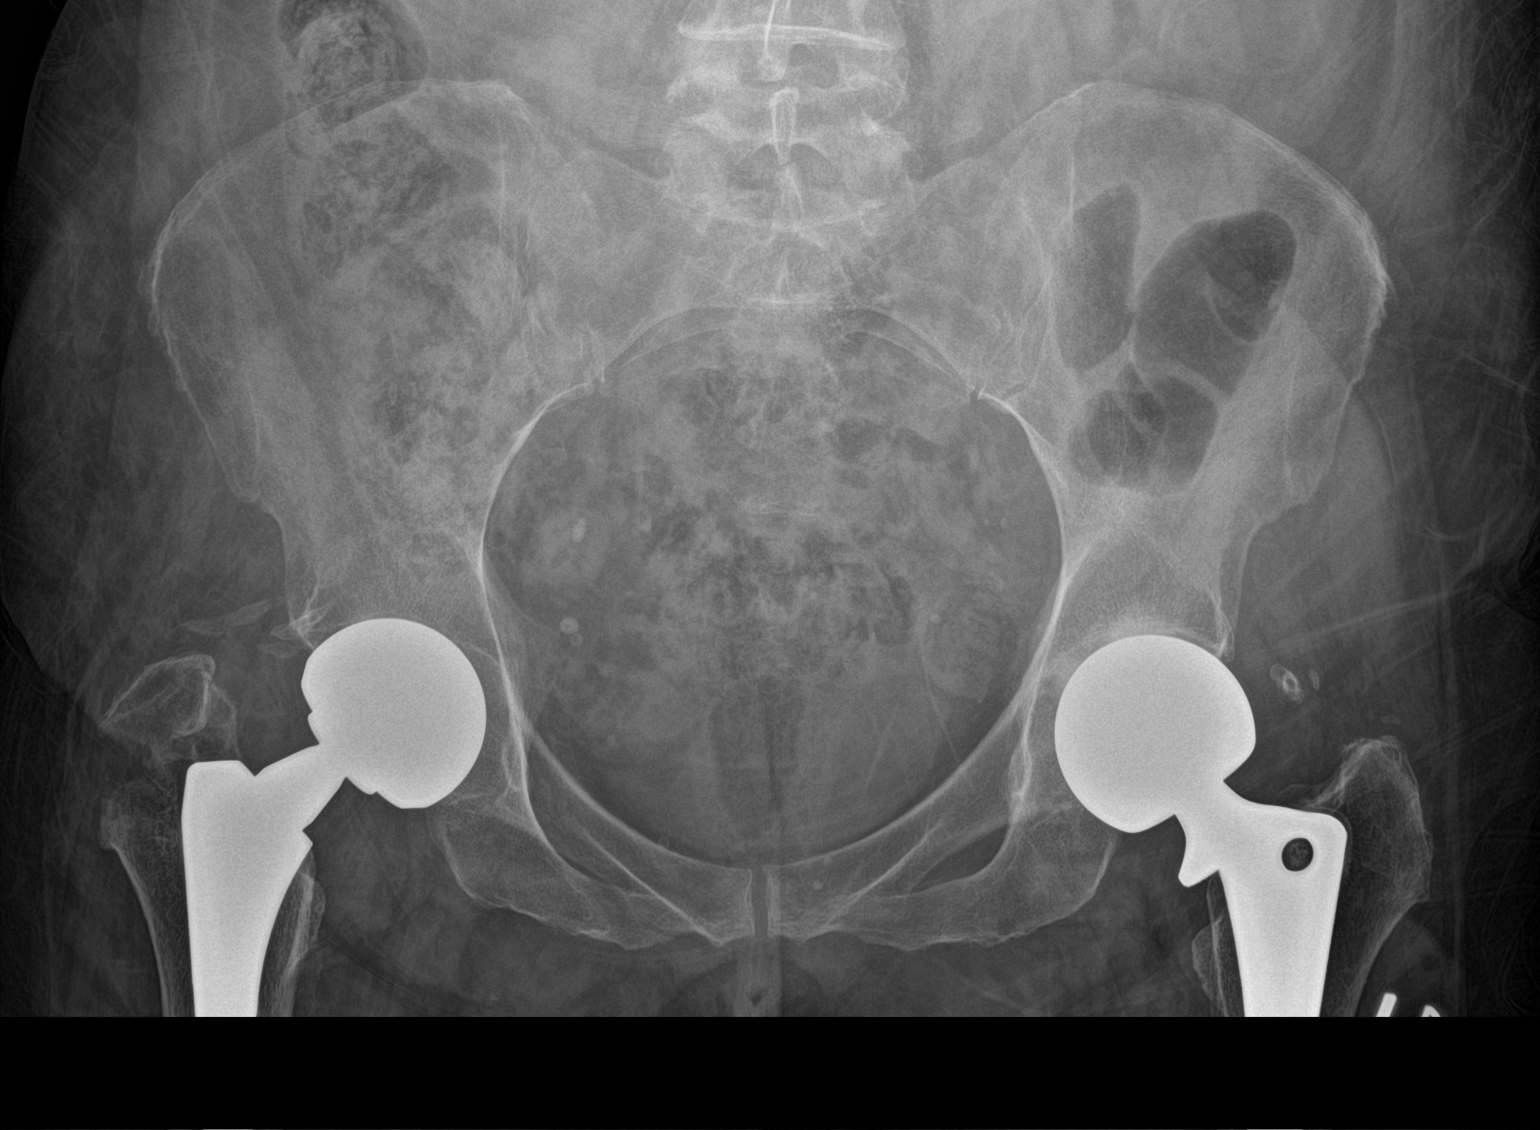
[im 2/3]
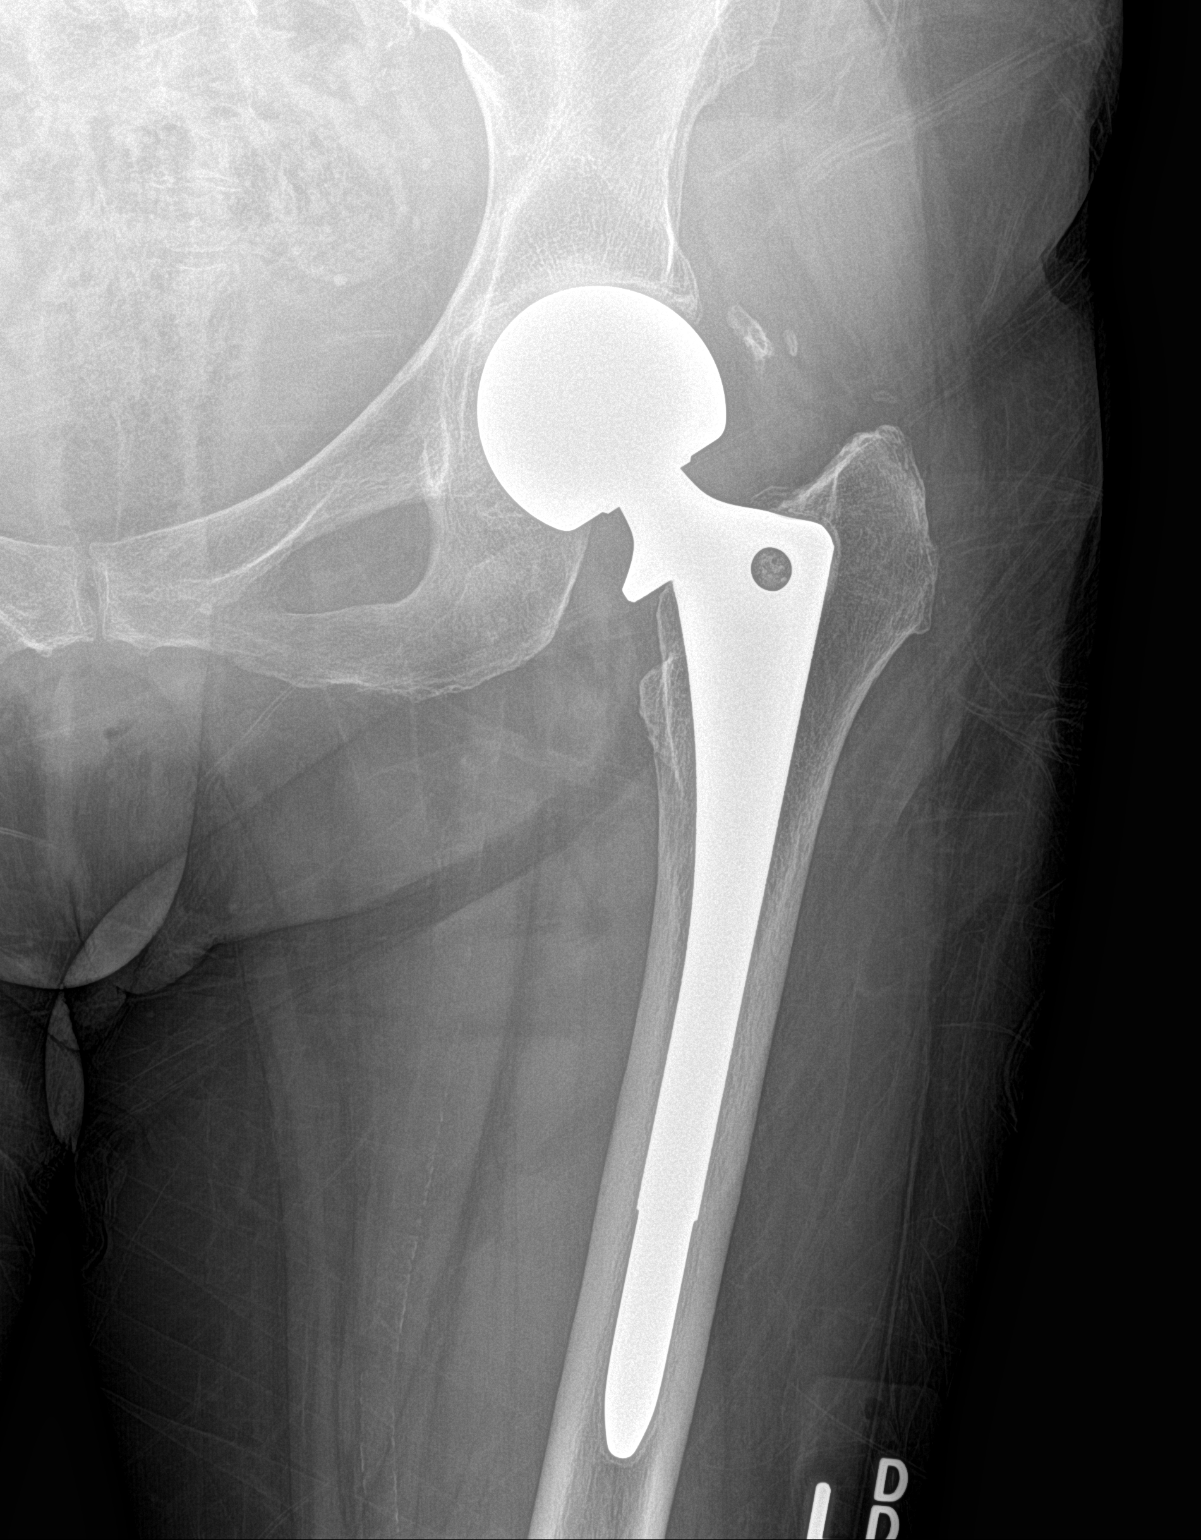
[im 3/3]
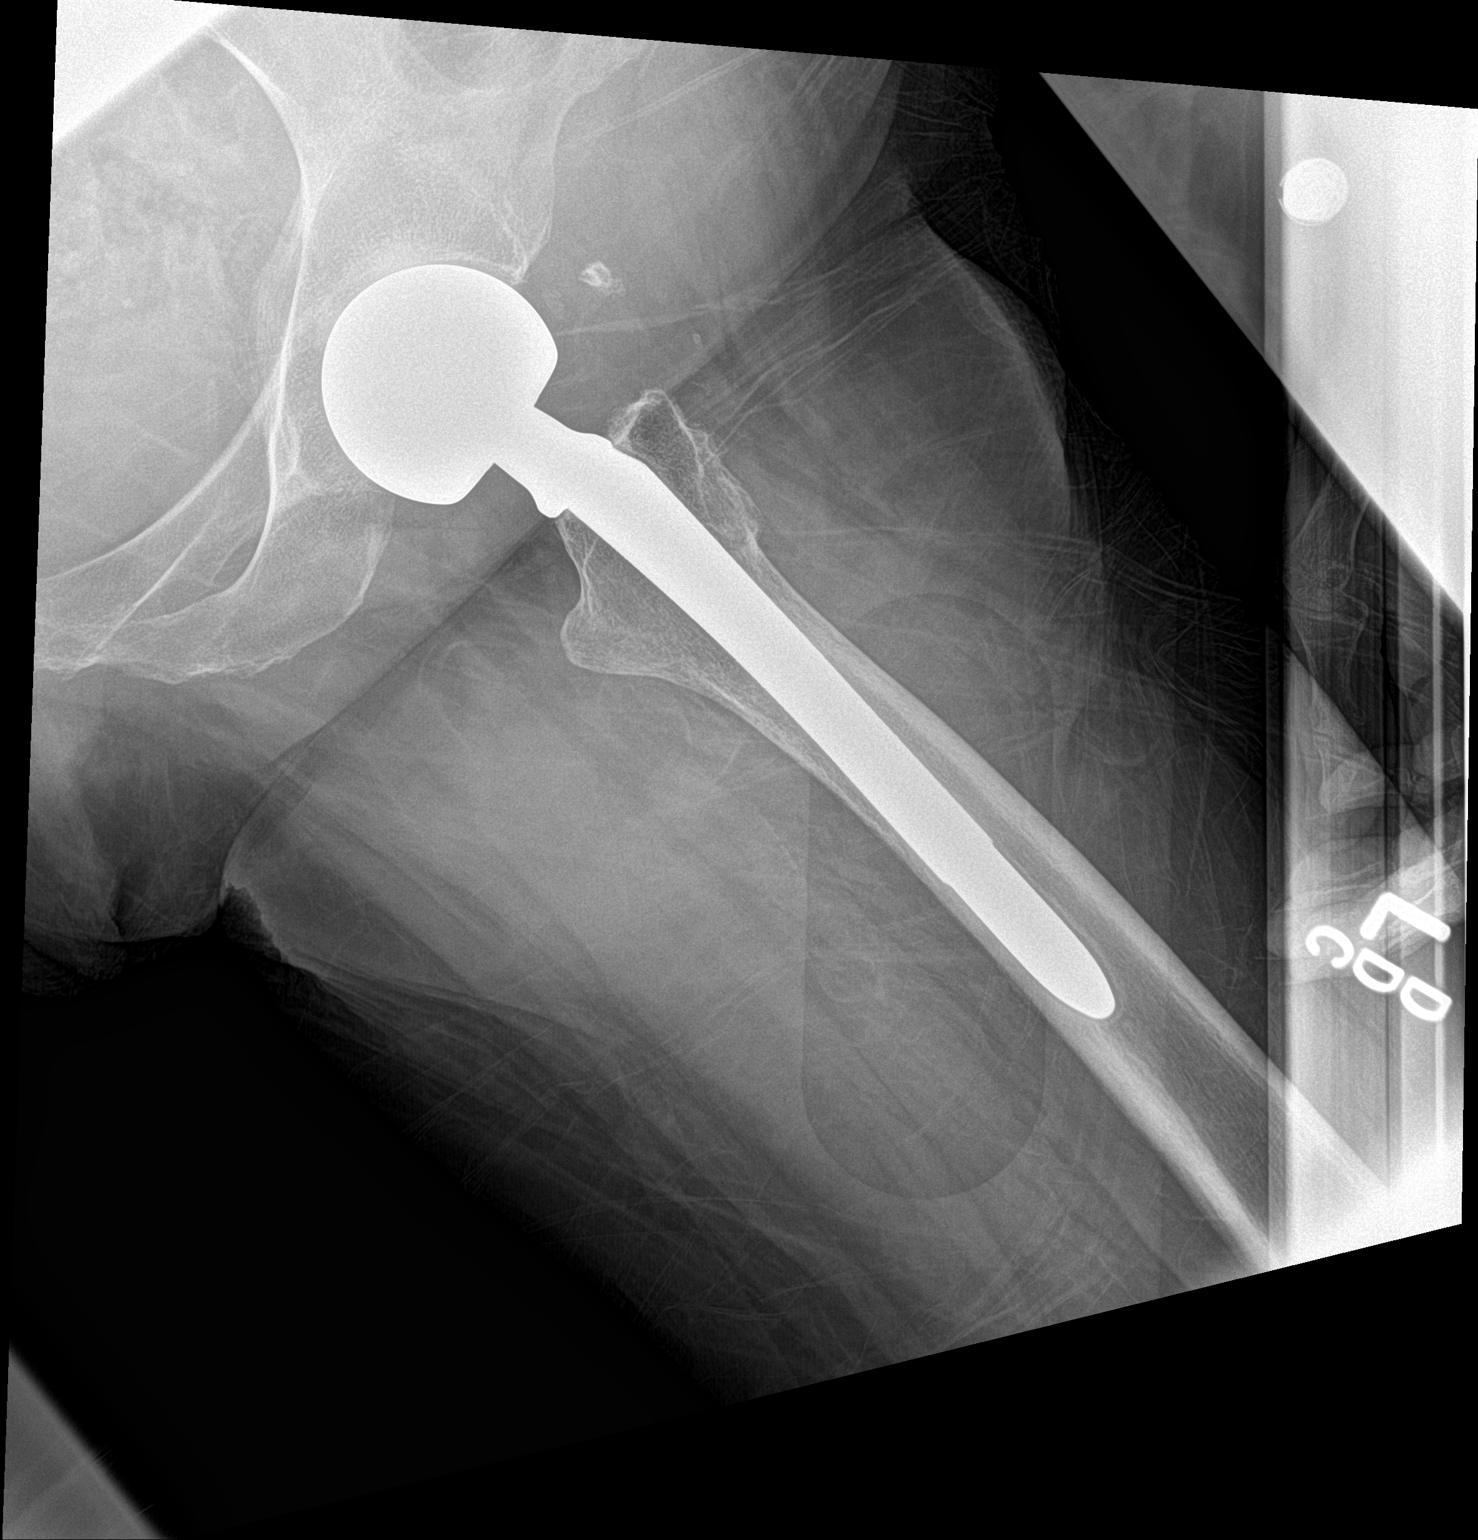

[3 of 3 positions shown; findings below may reference images not displayed]

FINDINGS: Proximal left femoral prosthesis is in good position with no
evidence of loosening. No acute osseous abnormality. Dystrophic
calcifications in the soft tissues around the left hip. Pelvic bones
are intact.

There is a proximal right femoral prosthesis. Since the prior
radiograph the patient has had a fracture of the superior aspect of
the right greater trochanter with slight distraction. This does not
appear acute.
IMPRESSION: No acute abnormalities.

## 2014-05-03 IMAGING — CT CT OF THE LEFT HIP WITHOUT CONTRAST
2 of 3 series · 16 of 46 positions shown, 18 images · non-contrast
Comparison: Radiographs today and [DATE].

CLINICAL DATA: Severe left hip pain starting last night. Multiple
recent falls, most recently 3 weeks ago. History of bilateral hip
arthroplasty. Initial encounter.

EXAM:
CT OF THE LEFT HIP WITHOUT CONTRAST
TECHNIQUE: Multidetector CT imaging of the left hip was performed according to
the standard protocol. Multiplanar CT image reconstructions were
also generated.

[Series 12: coronal soft tissue · coronal · 0.43mm/px · 3 of 99 slices shown]
[im 33/99  soft-tissue]
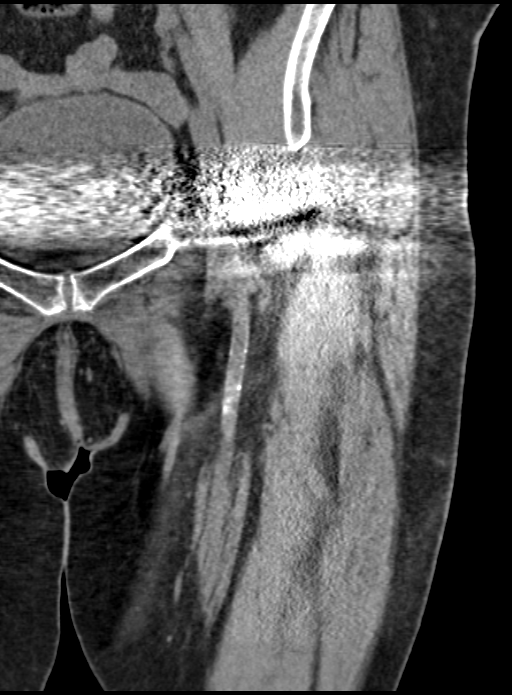
[im 44/99  soft-tissue]
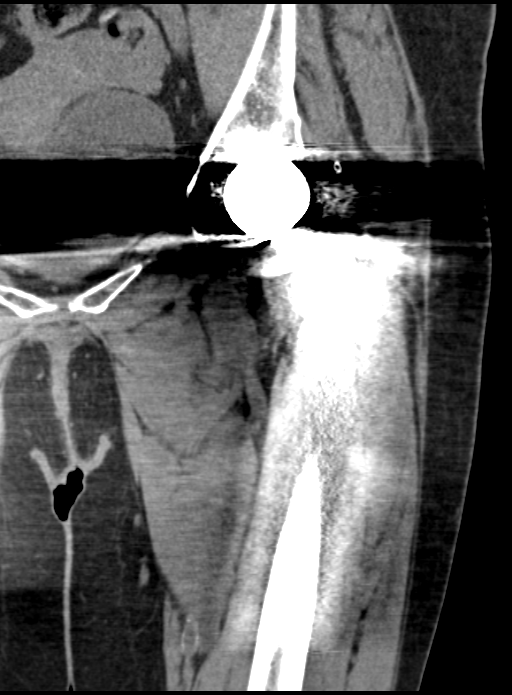
[im 55/99  soft-tissue]
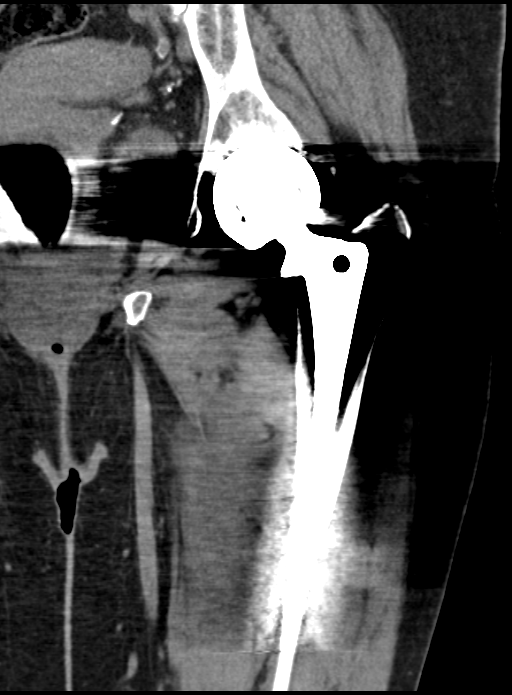

[Series 14: axial soft tissue · axial · 0.41mm/px · z∈[-90,+162]mm · 13 of 98 slices shown, 15 images]
[im 7/98  soft-tissue]
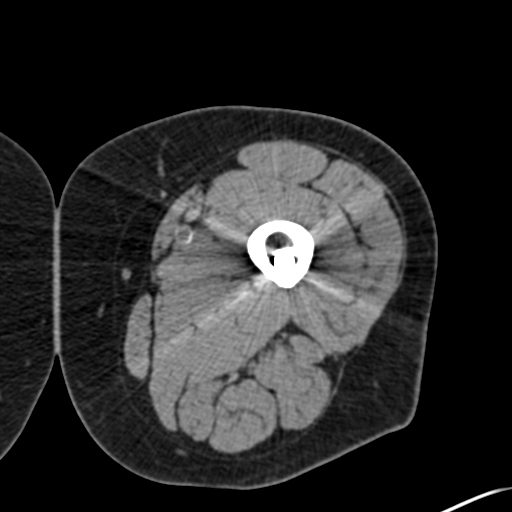
[im 7/98  bone]
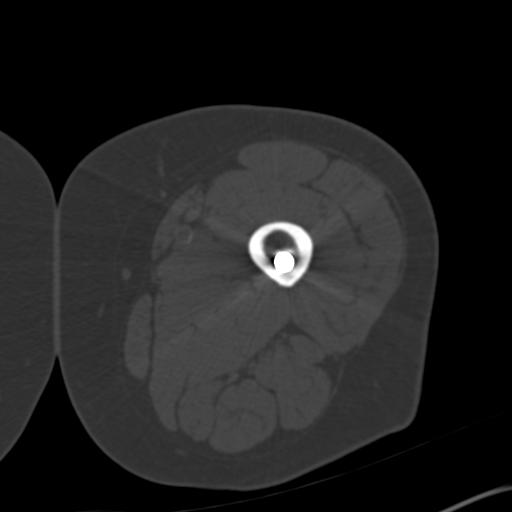
[im 13/98  soft-tissue]
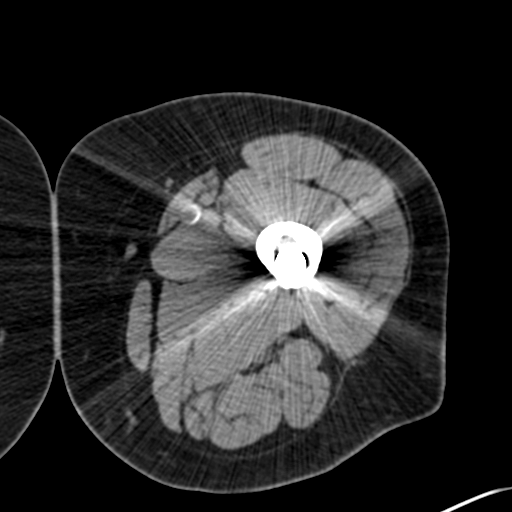
[im 19/98  soft-tissue]
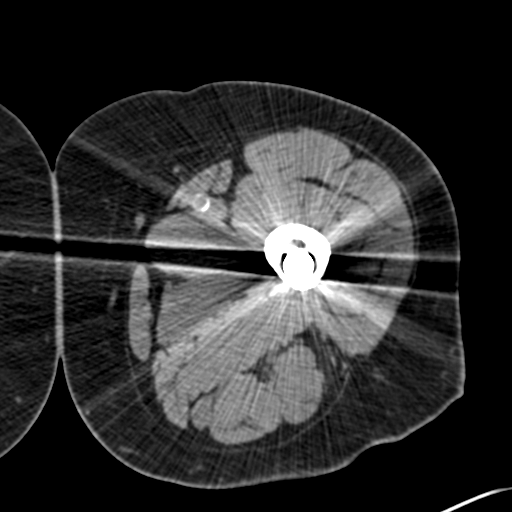
[im 26/98  soft-tissue]
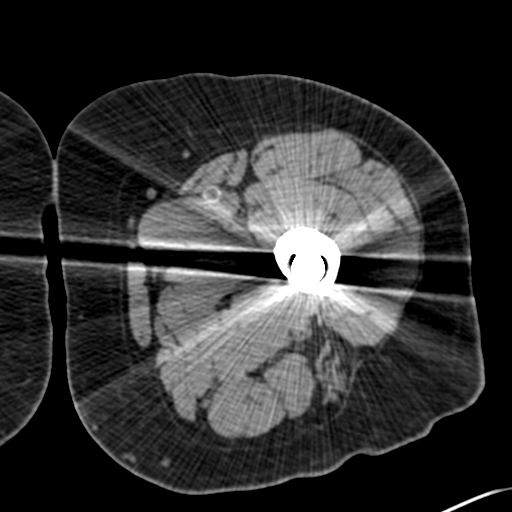
[im 32/98  soft-tissue]
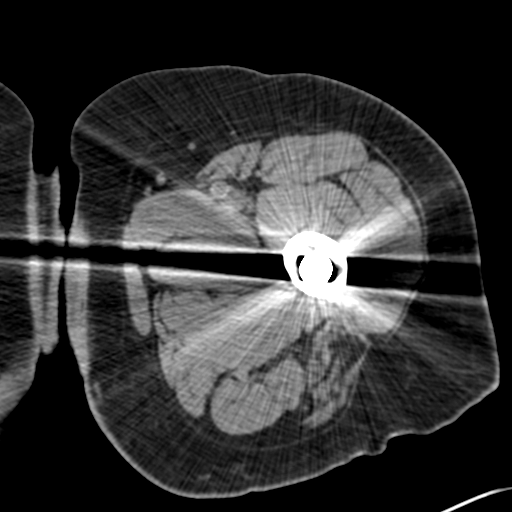
[im 41/98  soft-tissue]
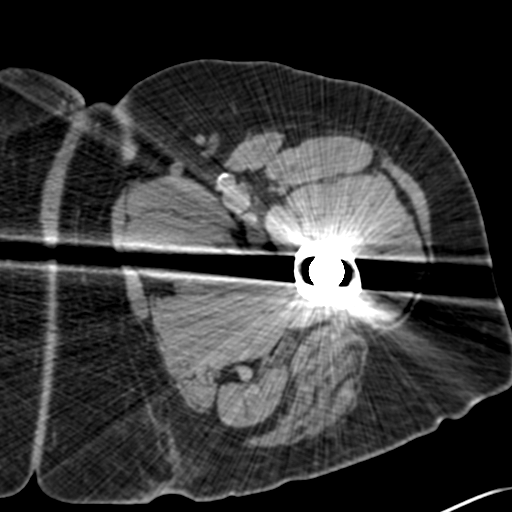
[im 51/98  soft-tissue]
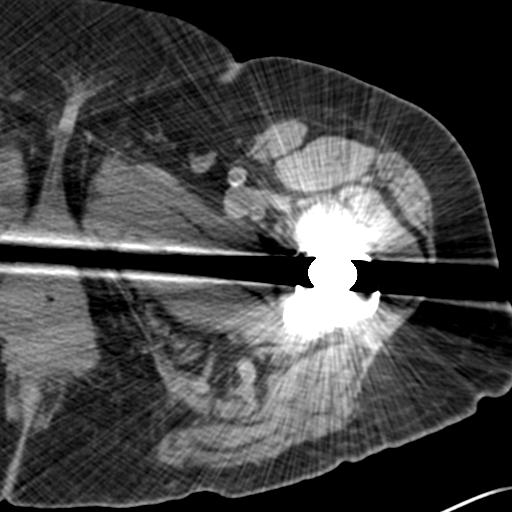
[im 57/98  soft-tissue]
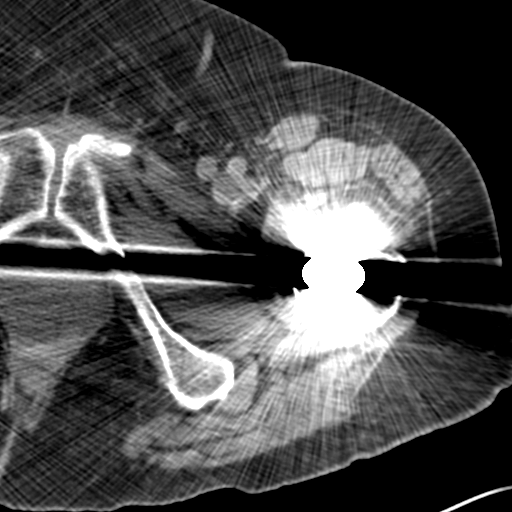
[im 63/98  soft-tissue]
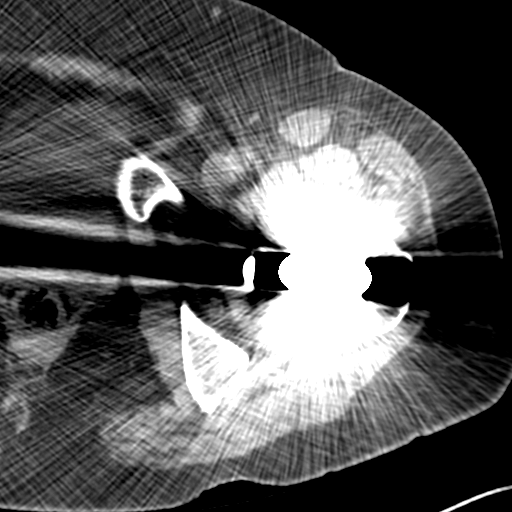
[im 63/98  bone]
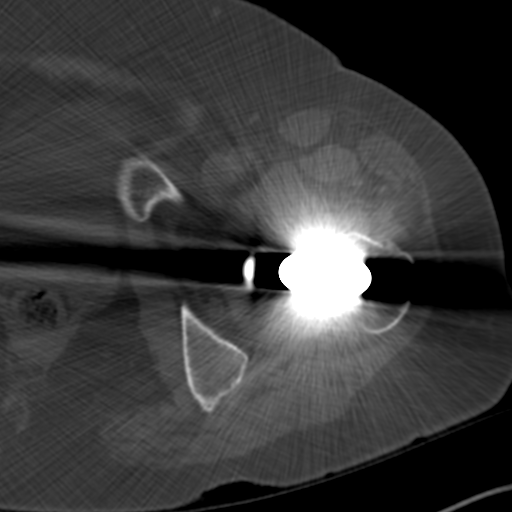
[im 69/98  soft-tissue]
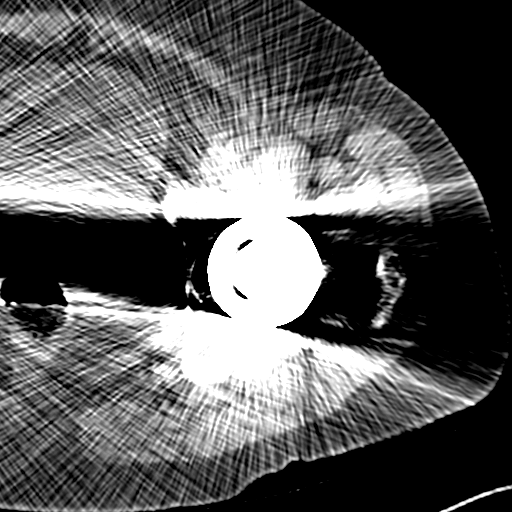
[im 79/98  soft-tissue]
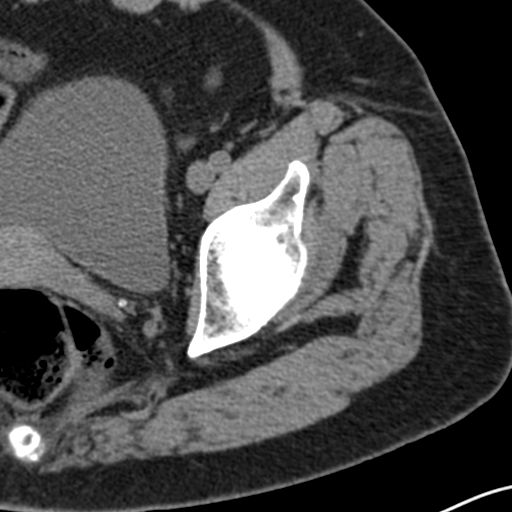
[im 85/98  soft-tissue]
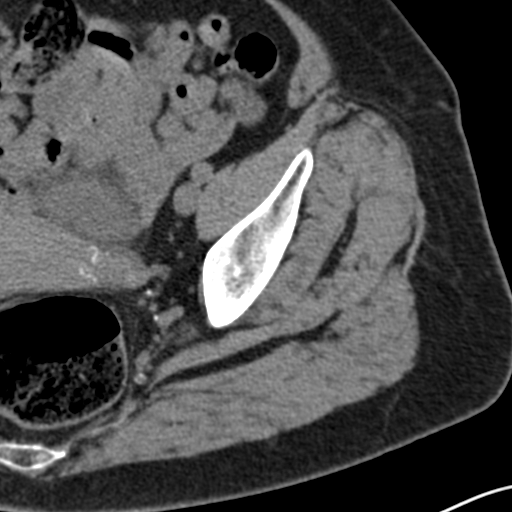
[im 91/98  soft-tissue]
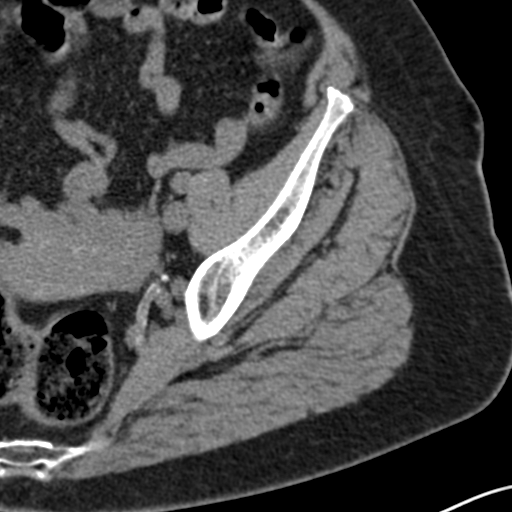

[16 of 46 positions shown; findings below may reference images not displayed]

FINDINGS: Examination is limited to the left hip and lower left hemipelvis.
The entire pelvis is not imaged.

Patient is status post bilateral hip hemiarthroplasty. The hardware
creates some beam hardening artifact. The acetabular and femoral
components of the left hip hemiarthroplasty appear well positioned
without loosening. There is no evidence of acute fracture or
dislocation at the left hip. There is evidence of a subacute
minimally displaced fracture of the distal coccyx, best seen on the
reformatted images. The visualized sacrum and sacroiliac joint are
intact.

There is no evidence of periarticular hematoma.
IMPRESSION: 1. Subacute minimally displaced fracture of the distal coccyx.
2. No evidence of left hip dislocation or proximal left femur
fracture status post hemiarthroplasty. No evidence of hardware
loosening.

## 2014-05-04 LAB — CBC WITH DIFFERENTIAL/PLATELET
BASOS PCT: 0.5 %
Basophil #: 0 10*3/uL (ref 0.0–0.1)
EOS PCT: 4.2 %
Eosinophil #: 0.4 10*3/uL (ref 0.0–0.7)
HCT: 29.9 % — ABNORMAL LOW (ref 35.0–47.0)
HGB: 9.3 g/dL — ABNORMAL LOW (ref 12.0–16.0)
LYMPHS ABS: 1.1 10*3/uL (ref 1.0–3.6)
Lymphocyte %: 11.3 %
MCH: 28.3 pg (ref 26.0–34.0)
MCHC: 31.2 g/dL — AB (ref 32.0–36.0)
MCV: 91 fL (ref 80–100)
MONO ABS: 1.1 x10 3/mm — AB (ref 0.2–0.9)
Monocyte %: 11.6 %
NEUTROS PCT: 72.4 %
Neutrophil #: 7 10*3/uL — ABNORMAL HIGH (ref 1.4–6.5)
Platelet: 215 10*3/uL (ref 150–440)
RBC: 3.3 10*6/uL — ABNORMAL LOW (ref 3.80–5.20)
RDW: 14.3 % (ref 11.5–14.5)
WBC: 9.6 10*3/uL (ref 3.6–11.0)

## 2014-05-04 LAB — BASIC METABOLIC PANEL
ANION GAP: 5 — AB (ref 7–16)
BUN: 29 mg/dL — AB (ref 7–18)
CALCIUM: 8.3 mg/dL — AB (ref 8.5–10.1)
CHLORIDE: 107 mmol/L (ref 98–107)
Co2: 27 mmol/L (ref 21–32)
Creatinine: 1.46 mg/dL — ABNORMAL HIGH (ref 0.60–1.30)
EGFR (African American): 46 — ABNORMAL LOW
EGFR (Non-African Amer.): 38 — ABNORMAL LOW
GLUCOSE: 283 mg/dL — AB (ref 65–99)
Osmolality: 294 (ref 275–301)
Potassium: 4.8 mmol/L (ref 3.5–5.1)
Sodium: 139 mmol/L (ref 136–145)

## 2014-05-05 ENCOUNTER — Encounter: Payer: Self-pay | Admitting: Internal Medicine

## 2014-05-05 LAB — BASIC METABOLIC PANEL
ANION GAP: 5 — AB (ref 7–16)
BUN: 21 mg/dL — AB (ref 7–18)
CALCIUM: 8.6 mg/dL (ref 8.5–10.1)
Chloride: 102 mmol/L (ref 98–107)
Co2: 28 mmol/L (ref 21–32)
Creatinine: 1.38 mg/dL — ABNORMAL HIGH (ref 0.60–1.30)
EGFR (Non-African Amer.): 40 — ABNORMAL LOW
GFR CALC AF AMER: 49 — AB
GLUCOSE: 236 mg/dL — AB (ref 65–99)
Osmolality: 281 (ref 275–301)
POTASSIUM: 4.3 mmol/L (ref 3.5–5.1)
Sodium: 135 mmol/L — ABNORMAL LOW (ref 136–145)

## 2014-05-05 LAB — URINE CULTURE

## 2014-05-05 IMAGING — CR DG LUMBAR SPINE COMPLETE 4+V
1 series · 5 of 5 positions shown · non-contrast
Comparison: [DATE].

CLINICAL DATA: Low back and bilateral lower extremity pain.

EXAM:
LUMBAR SPINE - COMPLETE 4+ VIEW

[Series 1: dxr lumbar spine with obliques · 0.14mm/px · 5 of 5 slices shown]
[im 1/5]
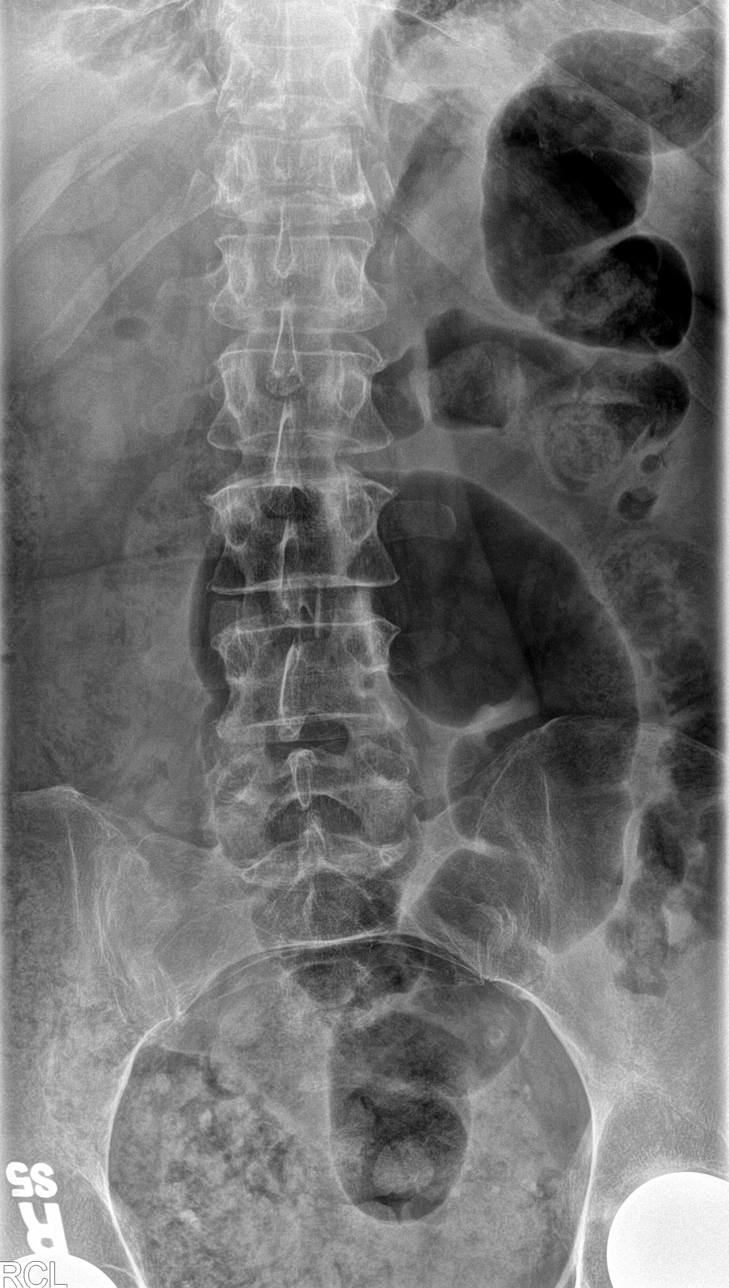
[im 2/5]
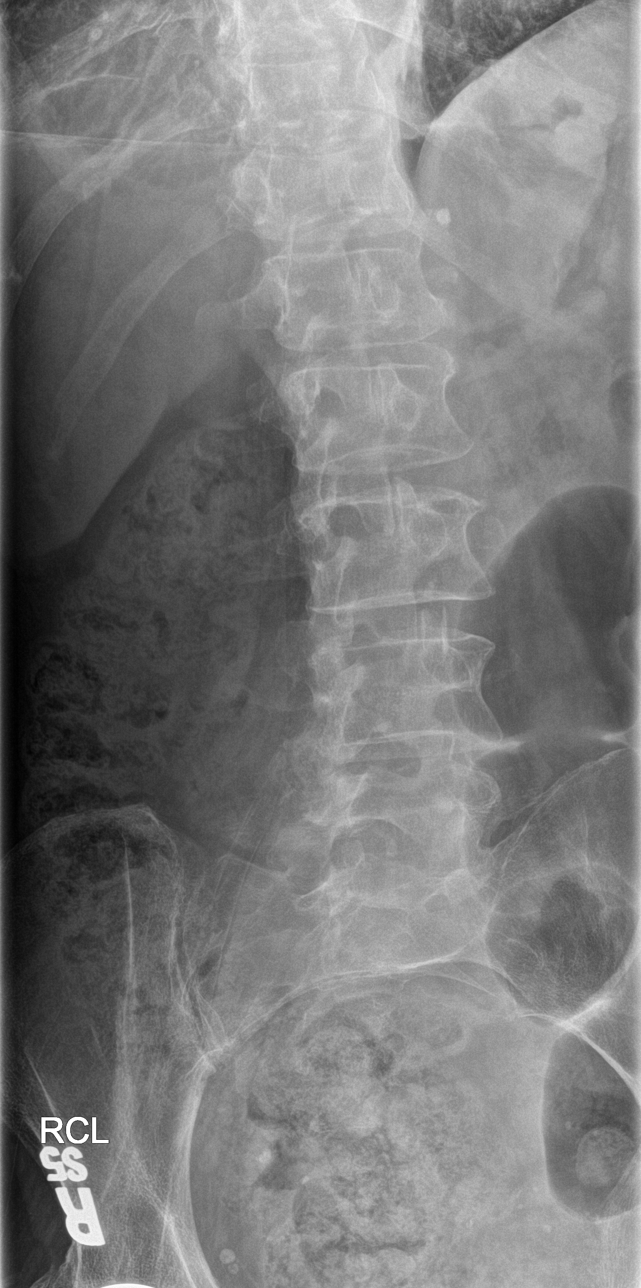
[im 3/5]
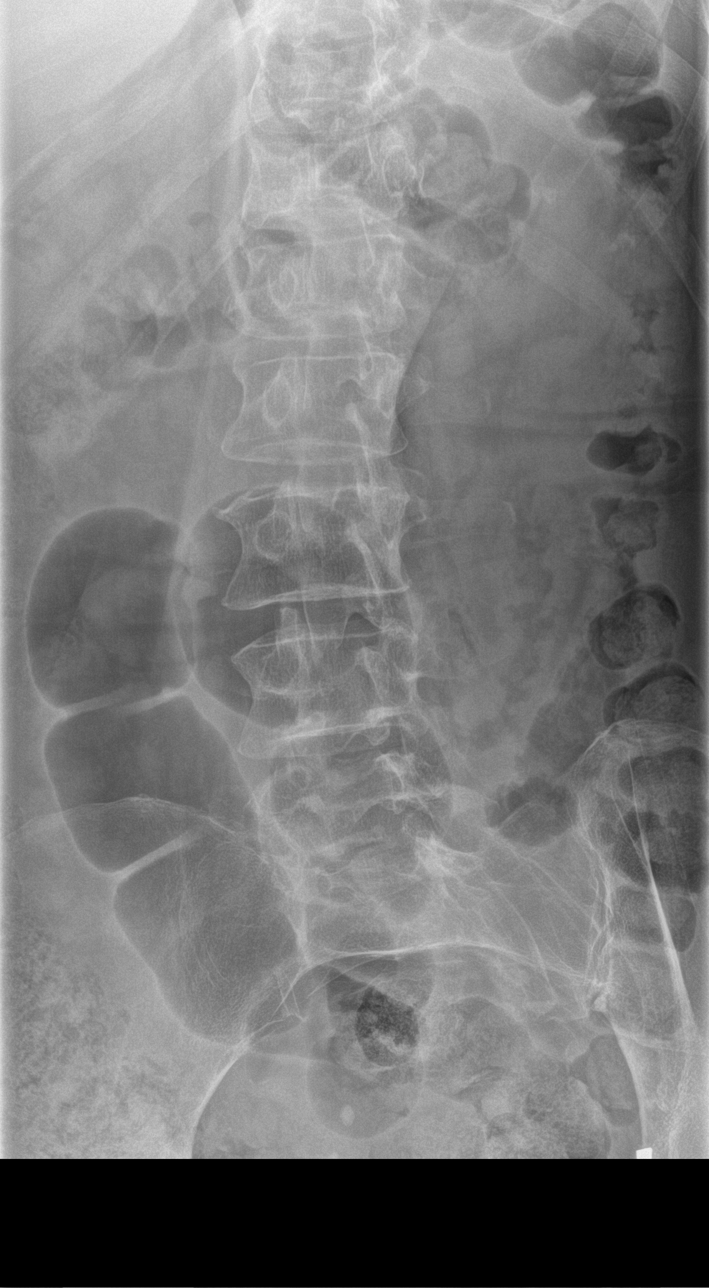
[im 4/5]
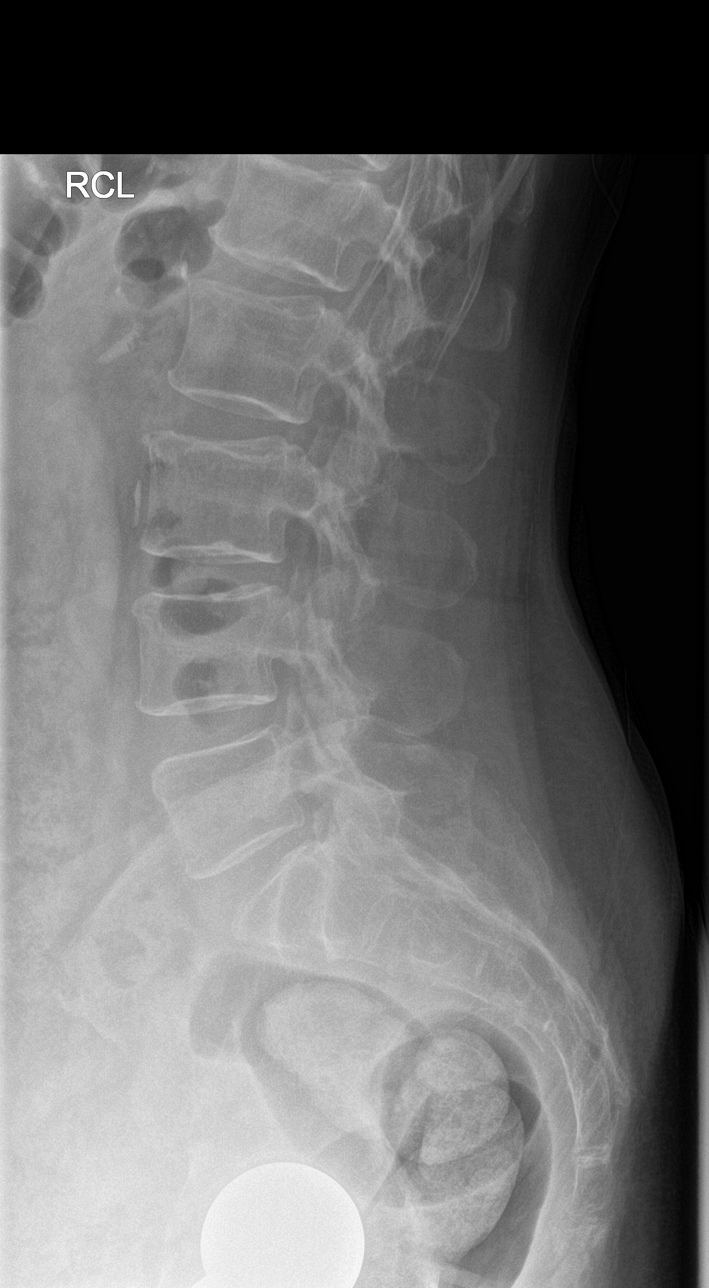
[im 5/5]
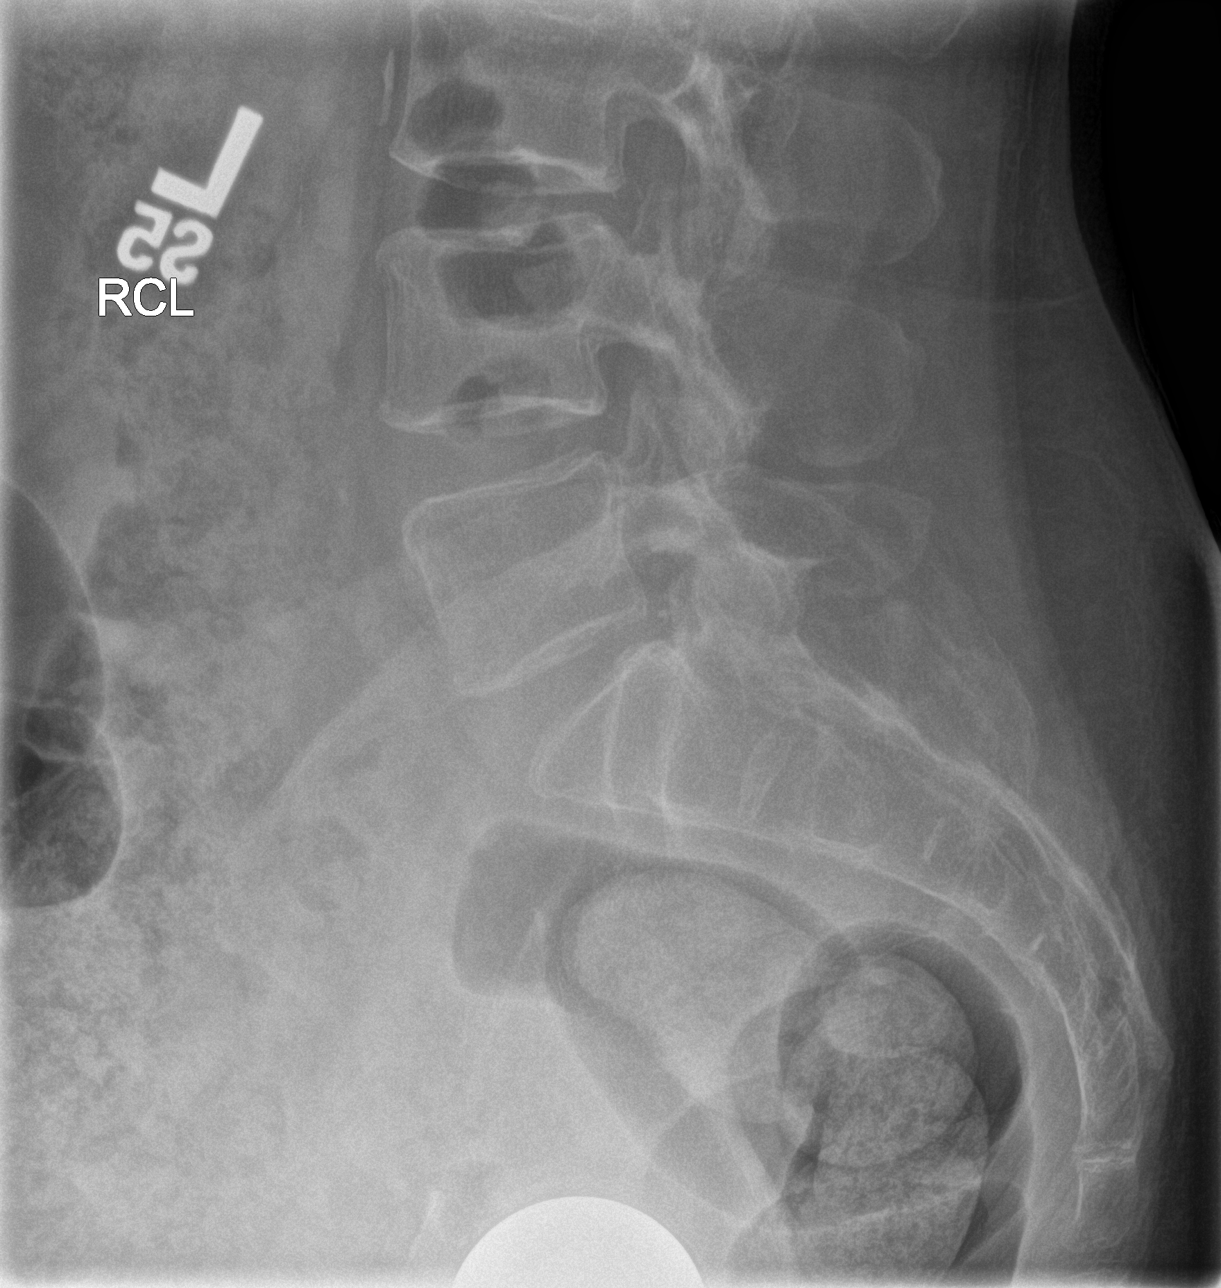

[5 of 5 positions shown; findings below may reference images not displayed]

FINDINGS: No acute fracture or spondylolisthesis is noted. Diffuse osteopenia
is noted. Disc spaces are well-maintained. Posterior facet joints
appear normal.
IMPRESSION: No acute abnormality seen in the lumbar spine.

## 2014-05-16 ENCOUNTER — Encounter: Payer: Self-pay | Admitting: Internal Medicine

## 2014-06-16 ENCOUNTER — Encounter: Payer: Self-pay | Admitting: Internal Medicine

## 2014-07-18 ENCOUNTER — Ambulatory Visit: Payer: Self-pay | Admitting: Internal Medicine

## 2014-07-18 DIAGNOSIS — Z1231 Encounter for screening mammogram for malignant neoplasm of breast: Secondary | ICD-10-CM | POA: Diagnosis not present

## 2014-08-23 DIAGNOSIS — D500 Iron deficiency anemia secondary to blood loss (chronic): Secondary | ICD-10-CM | POA: Diagnosis not present

## 2014-08-23 DIAGNOSIS — E119 Type 2 diabetes mellitus without complications: Secondary | ICD-10-CM | POA: Diagnosis not present

## 2014-08-23 DIAGNOSIS — N182 Chronic kidney disease, stage 2 (mild): Secondary | ICD-10-CM | POA: Diagnosis not present

## 2014-08-23 DIAGNOSIS — E1122 Type 2 diabetes mellitus with diabetic chronic kidney disease: Secondary | ICD-10-CM | POA: Diagnosis not present

## 2014-09-28 DIAGNOSIS — E119 Type 2 diabetes mellitus without complications: Secondary | ICD-10-CM | POA: Diagnosis not present

## 2014-09-29 DIAGNOSIS — E784 Other hyperlipidemia: Secondary | ICD-10-CM | POA: Diagnosis not present

## 2014-09-29 DIAGNOSIS — E119 Type 2 diabetes mellitus without complications: Secondary | ICD-10-CM | POA: Diagnosis not present

## 2014-09-29 DIAGNOSIS — I10 Essential (primary) hypertension: Secondary | ICD-10-CM | POA: Diagnosis not present

## 2014-10-05 DIAGNOSIS — E785 Hyperlipidemia, unspecified: Secondary | ICD-10-CM | POA: Diagnosis not present

## 2014-10-05 DIAGNOSIS — Z780 Asymptomatic menopausal state: Secondary | ICD-10-CM | POA: Diagnosis not present

## 2014-10-05 DIAGNOSIS — Z13820 Encounter for screening for osteoporosis: Secondary | ICD-10-CM | POA: Diagnosis not present

## 2014-10-05 DIAGNOSIS — N182 Chronic kidney disease, stage 2 (mild): Secondary | ICD-10-CM | POA: Diagnosis not present

## 2014-10-05 DIAGNOSIS — E119 Type 2 diabetes mellitus without complications: Secondary | ICD-10-CM | POA: Diagnosis not present

## 2014-10-05 DIAGNOSIS — Z0000 Encounter for general adult medical examination without abnormal findings: Secondary | ICD-10-CM | POA: Diagnosis not present

## 2014-10-07 NOTE — H&P (Signed)
PATIENT NAME:  Mckenzie Wright, Mckenzie Wright MR#:  [id number] DATE OF BIRTH:  April 26, 1946  DATE OF ADMISSION:  05/03/2014  PRIMARY CARE PHYSICIAN: Cletis Athens, MD  REFERRING PHYSICIAN: Jon Gills. Lord, MD  CHIEF COMPLAINT: Fall and severe left leg pain.   HISTORY OF PRESENT ILLNESS: The patient is a 69 year old Caucasian female brought into the ED after she sustained a fall and having difficulty with ambulation. According to the daughter, the patient has been having severe left lower extremity pain and difficulty with ambulation for the past 3 weeks, which has been progressively getting worse. In the ED, x-ray has revealed coccyx fracture. Also urinalysis is abnormal. Urine cultures were obtained and the patient is started on IV Rocephin. Her creatinine is also elevated. The patient is hard of hearing and poor historian. The history is obtained from the patient's daughter. The patient denies any chest pain or shortness of breath. The patient's daughter is concerned that the patient had bilateral total hip replacements done in the past. Left hip was done by Dr. Sabra Heck and she is concerned as the patient is having excruciating pain and having difficulty with ambulation.   PAST MEDICAL HISTORY: Hypertension, diabetes mellitus, chronic low back pain and lower extremity pain probably from degenerative joint disease and sacroiliitis, follows up with pain management as an outpatient. Hypothyroidism and hyperlipidemia.  PAST SURGICAL HISTORY: Bilateral hip replacements.   ALLERGIES: No known drug allergies.   PSYCHOSOCIAL HISTORY: Lives alone. No history of smoking, alcohol or illicit drug usage.   FAMILY HISTORY: Diabetes runs in her family.   HOME MEDICATIONS: Vitamin B12 at 1000 mg p.o. once daily, Novolin 70/30 ten units subcutaneously twice a day, metformin 1000 mg 1 tablet p.o. 2 times a day, lovastatin 40 mg once daily, levothyroxine 75 mcg p.o. once daily, iron sulfate 325 mg p.o. once daily,  hydrochlorothiazide/lisinopril 12.5/20 p.o. once daily, calcium with vitamin D once daily, aspirin 81 mg once daily.    REVIEW OF SYSTEMS: CONSTITUTIONAL: Denies any fever or fatigue. Complaining of weakness and excruciating left-sided pain.  EYES: Denies blurry vision, double vision.  EARS, NOSE AND THROAT: Denies epistaxis, discharge.  RESPIRATION: Denies cough, COPD.  CARDIOVASCULAR: No chest pain or palpitation.  GASTROINTESTINAL: Denies nausea, vomiting, diarrhea, abdominal pain.  GENITOURINARY: Complaining of dysuria. No hematuria.  GYNECOLOGIC AND BREAST: Denies breast mass or vaginal discharge.  ENDOCRINE: Denies polyuria, nocturia. Has hypothyroidism. Has insulin-dependent diabetes mellitus. HEMATOLOGIC AND LYMPHATICS: No anemia, easy bruising, bleeding.  INTEGUMENTARY: No acne, rash, lesions.  MUSCULOSKELETAL: Chronic low back pain and lower extremity pain, follows up with pain management.  NEUROLOGICAL: Denies any vertigo or ataxia.  PSYCHIATRIC: No ADD or OCD.   PHYSICAL EXAMINATION:  VITAL SIGNS: Temperature 98.4, pulse 74, respirations 18, blood pressure 133/66, pulse oximetry 99%.  GENERAL APPEARANCE: Not in acute distress. Moderately built and nourished.  HEENT: Normocephalic, atraumatic. Pupils are equally reacting to light and accommodation. No scleral icterus. No conjunctival injection. No sinus tenderness. No postnasal drip.  NECK: Supple. Range of motion intact. LUNGS: Clear to auscultation bilaterally. No accessory muscle use and no anterior chest wall tenderness on palpation.  CARDIOVASCULAR: S1 and S2 normal. Regular rate and rhythm. No murmurs. GASTROINTESTINAL: Soft. Bowel sounds are positive in all 4 quadrants. Nontender, nondistended. No hepatosplenomegaly. No masses felt. NEUROLOGIC: Awake, alert and oriented x 3. The patient is hard of hearing but sensory is intact. Motor: Left lower extremity range of motion is limited from severe tenderness. Lower back is  also tender, probably from  the coccygeal fracture. Straight leg raise test could not be elicited on the left side because of the pain, tenderness. Peripheral pulses are 2+.  SKIN: Warm to touch. Normal turgor. No rashes. No lesions.  MUSCULOSKELETAL: No joint effusion except the hip joint which is tender on the left side. No other joint tenderness. Lower back is tender in the sacrococcygeal area.   LABORATORY AND IMAGING STUDIES: Accu-Chek 449, glucose 439, BUN 31, creatinine 1.80. No previous labs of BUN and creatinine are available. Sodium and potassium are normal. Anion gap 7, GFR 30. Serum osmolality and calcium are normal. CBC: WBC is normal. Hemoglobin 10.2, hematocrit 30.9, platelets are 235,000. Urinalysis yellow in color, cloudy in appearance, glucose greater than 500, nitrites are negative, leukocyte esterase 2+, bacteria 2+, wbc clumps are present.   CAT scan of the left hip: Subacute minimally displaced fracture of the distal coccyx. No evidence of left hip dislocation or proximal left femur fracture. Status post hemiarthroplasty. No evidence of hardware loosening. Left hip x-ray: No acute abnormalities.   ASSESSMENT AND PLAN: A 69 year old female presenting after she sustained a fall and complaining of severe left leg pain which has been progressively getting worse for the past 3 weeks, unable to ambulate.   1.  Acute left lower extremity pain, status post a total hip replacement in the past. We will provide pain management and orthopedic consult is placed to Dr. Sabra Heck, as the surgery was done by Dr. Sabra Heck, as requested by the patient's daughter.  2.  Fall status post coccygeal fracture. Pain management and physical therapy consult is placed.  3.  Acute cystitis. We will provide IV fluids. Urine culture was ordered. The patient will be given IV Rocephin.  4.  Acute kidney injury, probably dehydration and prerenal. We will provide IV fluids, hold ACE inhibitor, thiazide and metformin.  Check a.m. laboratories, avoid nephrotoxins.  5.  Insulin-requiring diabetes mellitus with hyperglycemia. The patient missed her a.m. dose of long-acting insulin. We will resume her long-acting insulin Novolin 70/30 ten units subcutaneous 2 times a day. Hold off on metformin and the patient will be on sliding scale insulin.  6.  We will provide gastrointestinal prophylaxis and deep venous thrombosis prophylaxis.   Plan of care was discussed in the patient and her daughter at bedside. They both verbalized understanding of the plan.   CODE STATUS: The patient is full code. Daughter is the medical power of attorney.    ____________________________ Nicholes Mango, MD ag:TT D: 05/03/2014 20:55:06 ET T: 05/03/2014 21:44:09 ET JOB#: [id number]  cc: Nicholes Mango, MD, <Dictator> Park Breed, MD Nicholes Mango MD ELECTRONICALLY SIGNED 05/12/2014 18:09

## 2014-10-07 NOTE — Consult Note (Signed)
Brief Consult Note: Diagnosis: bilateral lower extremity pain.   Patient was seen by consultant.   Consult note dictated.   Recommend further assessment or treatment.   Orders entered.   Comments: Patient admitted due to lower extremity pain which she states radiates down the posterior aspect of both legs into the feet.  She complains of pain in both hips and the back as well.  History of a fall a few weeks ago.  Since then progressively worsening lower extremity pain, left > right.  She has difficulty flexing her hip and knees due to pain.   She denies numbness or tingling.  Tenderness over the lumbar spine.  Pelvis CT and plain films show bilateral total hip arthroplasty prostheses without fracture, dislocation or other complication.   Pain possibly coming from her lumbar spine.  I will order a lumbar spine series and follow up with the patient tomorrow.  She had PT today and stated that PT went well.  Electronic Signatures: Thornton Park (MD)  (Signed [phone] 20:30)  Authored: Brief Consult Note   Last Updated: 19-Nov-15 20:30 by Thornton Park (MD)

## 2014-10-07 NOTE — Discharge Summary (Signed)
PATIENT NAME:  Mckenzie Wright, Mckenzie Wright MR#:  [id number] DATE OF BIRTH:  [birth year]  DATE OF ADMISSION:  05/03/2014 DATE OF DISCHARGE:  05/05/2014  PRIMARY CARE PHYSICIAN: Dr. Lavera Guise   FINAL DIAGNOSES: 1.  Acute left lower extremity pain and back pain.  2.  Acute cystitis without hematuria.  3.  Renal insufficiency, likely element of chronic kidney disease, stage III.  4.  Essential hypertension.  5.  Diabetes type 2, uncontrolled.  6.  Hypothyroidism.   DISCHARGE MEDICATIONS: Include levothyroxine 75 mcg daily, vitamin B12 1000 mcg daily, aspirin 81 mg daily, lovastatin 40 mg daily, One-A-Day multivitamin daily, ferrous sulfate 325 mg daily, calcium and vitamin D 600 mg/500 international units 1 tablet twice a day, oxycodone 5 mg every 6 hours as needed for severe pain, 70/30 insulin 18 units subcutaneous injection twice a day with meals, breakfast and dinner, Cipro 500 mg 1 tablet every 12 hours for 3 days.   Stop taking metformin, hydrochlorothiazide, and lisinopril. The Novolin 70/30 insulin dose was changed.   DISCHARGE DIET: Low sodium diet, carbohydrate-controlled diet, regular consistency.   DISCHARGE ACTIVITY: As tolerated.  DISCHARGE FOLLOWUP: Follow up with physical therapy, Dr. Mack Guise orthopedics in 2 weeks if needed, 1 to 2 days with doctor at rehab.   HISTORY OF PRESENT ILLNESS: The patient was admitted May 03, 2014 and discharged May 05, 2014. Came in with back pain, fall, severe leg pain. The patient was put in for an orthopedic consultation and physical therapy consultation. The patient had a small coccyx fracture. For the patient's acute cystitis, the patient was put on IV Rocephin. For the patient's renal insufficiency, unclear whether it was acute renal failure. Creatinine did improve with some IV fluid hydration, but there is likely an element of chronic kidney disease. The ACE inhibitor and hydrochlorothiazide was held. Metformin was actually held. For the  uncontrolled diabetes, the patient was put on sliding scale and her usual insulin to start with.   DIAGNOSTIC DATA: During the hospital included an EKG that showed normal sinus rhythm.   Left hip x-ray: No acute abnormality.   CT scan of the left hip without contrast showed subtle minimal displaced fracture of the distal coccyx. No hip dislocation or proximal femur fracture.   Urine culture grew out 100,000 gram-negative rods. Urinalysis 2+ leukocyte esterase, 2+ bacteria. Creatinine on presentation was 1.8, BUN 31. White blood cell count 8.7, H and H 10 and 30.9, platelet count 235,000. Creatinine on the 19th improved to 1.46 and upon discharge improved to 1.38.  Lumbar x-rays showed no acute abnormality seen.  HOSPITAL COURSE PER PROBLEM LIST:  1.  For the patient's acute left lower extremity pain and back pain, the patient was seen in consultation by orthopedics, Dr. Mack Guise. X-rays were negative, on the lumbar spine. The patient on CT scan had a distal coccygeal fracture. Pain control with oral oxycodone ordered. Physical therapy recommended rehab. The patient will go to physical therapy. Can follow up with Dr. Mack Guise in a couple of weeks if pain no better. If pain is improved, may not have to follow up with Dr. Mack Guise.  2.  Acute cholecystitis without hematuria. The patient was started on IV Rocephin. Gram-negative rods, 100,000, in the urine culture. Will switch over to p.o. Cipro for another 3 days.  3.  Renal insufficiency. This could be acute on chronic kidney disease. Likely element of chronic kidney disease, stage III. Did improve with holding the lisinopril/hydrochlorothiazide and some fluids during the hospital stay.  4.  Essential hypertension. Blood pressure on the lower side. Continue to hold the lisinopril/hydrochlorothiazide.  5.  Diabetes type 2, uncontrolled. I increased the 70/30 insulin to 18 units subcutaneous injection twice a day. Recommend checking fingersticks  before meals and at bedtime.  6.  Hypothyroidism. On levothyroxine.  7.  Hyperlipidemia. On lovastatin.  TIME SPENT ON DISCHARGE: 35 minutes.   ____________________________ Tana Conch. Leslye Peer, MD rjw:sb D: 05/05/2014 10:00:46 ET T: 05/05/2014 10:28:00 ET JOB#: [id number]  cc: Tana Conch. Leslye Peer, MD, <Dictator> Cletis Athens, MD Marisue Brooklyn MD ELECTRONICALLY SIGNED 05/05/2014 16:32

## 2014-10-08 NOTE — Op Note (Signed)
PATIENT NAME:  Mckenzie Wright, Mckenzie Wright MR#:  [id number] DATE OF BIRTH:  07-13-1945  DATE OF PROCEDURE:  10/14/2011  PREOPERATIVE DIAGNOSIS: Visually significant cataract of the right eye.   POSTOPERATIVE DIAGNOSIS: Visually significant cataract of the right eye.   OPERATIVE PROCEDURE: Cataract extraction by phacoemulsification with implant of intraocular lens to the right eye.   SURGEON: Birder Robson, MD  ANESTHESIA:  1. Managed anesthesia care.  2. 50-50 mixture of 0.75% bupivacaine and 4% Xylocaine given as a retrobulbar block.   COMPLICATIONS: None.   TECHNIQUE:  Four quadrant divide and conquer.   DESCRIPTION OF PROCEDURE: The patient was examined and consented for this procedure in the preoperative holding area and then brought back to the Operating Room where the anesthesia team employed managed anesthesia care.  3.5 milliliters of the aforementioned mixture were placed in the right orbit on an Atkinson needle without complication. The right eye was then prepped and draped in the usual sterile ophthalmic fashion. A lid speculum was placed. The side-port blade was used to create a paracentesis and the anterior chamber was filled with viscoelastic. The keratome was used to create a near clear corneal incision. The continuous curvilinear capsulorrhexis was performed with a cystotome followed by the capsulorrhexis forceps. Hydrodissection and hydrodelineation were carried out with BSS on a blunt cannula. The lens was removed in a four quadrant divide and conquer technique. The remaining cortical material was removed with the irrigation-aspiration handpiece. The capsular bag was inflated with viscoelastic and the ZCB00 26.0-diopter lens, serial number [id number] was placed in the capsular bag without complication. The remaining viscoelastic was removed from the eye with the irrigation-aspiration handpiece. The wounds were hydrated. The anterior chamber was flushed with Miostat. The eye was inflated to  a physiologic pressure and the wounds were found    to be water tight. The eye was dressed with Vigamox followed by Maxitrol ointment and a protective shield was placed. The patient will followup with me in one day.  ____________________________ Livingston Diones. Tiwan Schnitker, MD wlp:slb D: 10/14/2011 12:24:19 ET T: 10/14/2011 12:47:04 ET JOB#: [id number]  cc: Desmund Elman L. Dyanne Yorks, MD, <Dictator> Livingston Diones Alexavier Tsutsui MD ELECTRONICALLY SIGNED 10/21/2011 13:34

## 2014-10-08 NOTE — Op Note (Signed)
PATIENT NAME:  Mckenzie Wright, Mckenzie Wright MR#:  [id number] DATE OF BIRTH:  21-Nov-1945  DATE OF PROCEDURE:  12/03/2011  PROCEDURES PERFORMED:  1. Pars plana vitrectomy of the left eye.  2. Panretinal photocoagulation of the left eye.  3. Phacoemulsification and intraocular lens insertion of the left eye.   PREOPERATIVE DIAGNOSES:  1. Nonclearing vitreous hemorrhage, left eye.  2. Proliferative diabetic retinopathy, left eye.  3. Visually significant cataract of the left eye.   POSTOPERATIVE DIAGNOSES:  1. Nonclearing vitreous hemorrhage, left eye.  2. Proliferative diabetic retinopathy, left eye.  3. Visually significant cataract of the left eye.   ESTIMATED BLOOD LOSS: Less than 1 mL.  PRIMARY SURGEON: Garlan Fair, M.D.   ANESTHESIA: Retrobulbar block of the left eye with monitored anesthesia care.   COMPLICATIONS: None.   INDICATIONS FOR PROCEDURE: This is a patient who presented to my office several months ago with a vitreous hemorrhage and decreased vision in the left eye. Examination revealed a visually significant cataract and nonclearing vitreous hemorrhage. Multiple months were allowed to go by to see if the vitreous hemorrhage would clear and it did not. Risks, benefits, and alternatives of the above procedure were discussed and the patient wished to proceed.   DETAILS OF PROCEDURE: After informed consent was obtained, the patient was brought into the operative suite at [facility]. The patient was placed in the supine position and was given a small dose of alfentanil and a retrobulbar block was performed on the left eye by the primary surgeon without any complications. The left eye was prepped and draped in sterile manner. After lid speculum was inserted, a side-port wound was created at approximately 10:30 in a clear corneal fashion. DisCoVisc was injected into the anterior chamber to maintain it. A keratome blade was used to create a main corneal wound at  approximately 12:00. Cystotome was introduced in the eye and a continuous 360-degree anterior capsulorrhexis was created without complications. The lens was hydrodissected using BSS on a 26-gauge cannula. The lens was rotated for 90 degrees. Phacoemulsification wand was introduced and the lens was broken into four quadrants and removed without complication. INA was used to remove the cortical material. DisCoVisc was injected into the capsular bag. A 26-diopter ZCB00 Tecnis lens, serial [id number], was injected into the capsular bag and rotated into position. DisCoVisc was removed using INA. The wounds were hydrated and noted to be watertight. Attention was turned to the pars plana vitrectomy portion of the case.   A 25-gauge trocar was placed inferotemporally through displaced conjunctiva in an oblique fashion 3 mm beyond the limbus. The infusion cannula was turned on and inserted through the trocar and secured into position with Steri-Strips. Two more trocars were placed in a similar fashion superotemporally and superonasally. The vitreous cutter and light pipe were introduced in the eye and a core vitrectomy was performed. During the course of vitrectomy, the traction on the vitreous face was enough to elevate the small proliferative membranes off of the retina and completely release the vitreous. All of the core vitreous was removed and the vitreous was trimmed for 360 degrees in the periphery. Endolaser was introduced and panretinal photocoagulation was performed for 360 degrees. Once this was completed, a scleral depressed exam was performed and no signs of any breaks, tears, or retinal detachment could be identified. A 75% air-fluid exchange was performed. Trocars were removed and the wounds were noted to be airtight. Pressure in the eye was confirmed to be approximately 15 mmHg.  5 mg of dexamethasone was given into the inferior fornix and the lid speculum was removed. The eye was cleaned and TobraDex was  placed on the eye. A patch and shield were placed over the eye. The patient was taken to postanesthesia care with instructions to remain head up.     ____________________________ Teresa Pelton. Starling Manns, MD mfa:bjt D: 12/03/2011 10:59:00 ET T: 12/03/2011 11:12:04 ET JOB#: [id number]  cc: Teresa Pelton. Starling Manns, MD, <Dictator> Coralee Rud MD ELECTRONICALLY SIGNED 12/10/2011 7:07

## 2014-10-30 DIAGNOSIS — R2681 Unsteadiness on feet: Secondary | ICD-10-CM | POA: Diagnosis not present

## 2014-10-30 DIAGNOSIS — E119 Type 2 diabetes mellitus without complications: Secondary | ICD-10-CM | POA: Diagnosis not present

## 2014-10-30 DIAGNOSIS — N182 Chronic kidney disease, stage 2 (mild): Secondary | ICD-10-CM | POA: Diagnosis not present

## 2014-11-03 DIAGNOSIS — N182 Chronic kidney disease, stage 2 (mild): Secondary | ICD-10-CM | POA: Diagnosis not present

## 2014-11-03 DIAGNOSIS — E119 Type 2 diabetes mellitus without complications: Secondary | ICD-10-CM | POA: Diagnosis not present

## 2014-11-03 DIAGNOSIS — G459 Transient cerebral ischemic attack, unspecified: Secondary | ICD-10-CM | POA: Diagnosis not present

## 2014-11-10 DIAGNOSIS — G450 Vertebro-basilar artery syndrome: Secondary | ICD-10-CM | POA: Diagnosis not present

## 2014-11-10 DIAGNOSIS — E119 Type 2 diabetes mellitus without complications: Secondary | ICD-10-CM | POA: Diagnosis not present

## 2014-11-10 DIAGNOSIS — I213 ST elevation (STEMI) myocardial infarction of unspecified site: Secondary | ICD-10-CM | POA: Diagnosis not present

## 2014-11-10 DIAGNOSIS — R2681 Unsteadiness on feet: Secondary | ICD-10-CM | POA: Diagnosis not present

## 2014-11-24 DIAGNOSIS — G450 Vertebro-basilar artery syndrome: Secondary | ICD-10-CM | POA: Diagnosis not present

## 2014-11-24 DIAGNOSIS — E1122 Type 2 diabetes mellitus with diabetic chronic kidney disease: Secondary | ICD-10-CM | POA: Diagnosis not present

## 2014-11-24 DIAGNOSIS — R2681 Unsteadiness on feet: Secondary | ICD-10-CM | POA: Diagnosis not present

## 2014-11-24 DIAGNOSIS — E119 Type 2 diabetes mellitus without complications: Secondary | ICD-10-CM | POA: Diagnosis not present

## 2014-12-22 DIAGNOSIS — N182 Chronic kidney disease, stage 2 (mild): Secondary | ICD-10-CM | POA: Diagnosis not present

## 2014-12-22 DIAGNOSIS — R2681 Unsteadiness on feet: Secondary | ICD-10-CM | POA: Diagnosis not present

## 2014-12-22 DIAGNOSIS — G450 Vertebro-basilar artery syndrome: Secondary | ICD-10-CM | POA: Diagnosis not present

## 2014-12-22 DIAGNOSIS — E119 Type 2 diabetes mellitus without complications: Secondary | ICD-10-CM | POA: Diagnosis not present

## 2015-01-05 DIAGNOSIS — Z96649 Presence of unspecified artificial hip joint: Secondary | ICD-10-CM | POA: Diagnosis not present

## 2015-01-05 DIAGNOSIS — M25659 Stiffness of unspecified hip, not elsewhere classified: Secondary | ICD-10-CM | POA: Diagnosis not present

## 2015-01-05 DIAGNOSIS — G450 Vertebro-basilar artery syndrome: Secondary | ICD-10-CM | POA: Diagnosis not present

## 2015-01-05 DIAGNOSIS — M1990 Unspecified osteoarthritis, unspecified site: Secondary | ICD-10-CM | POA: Diagnosis not present

## 2015-01-22 DIAGNOSIS — Z96643 Presence of artificial hip joint, bilateral: Secondary | ICD-10-CM | POA: Diagnosis not present

## 2015-01-22 DIAGNOSIS — S72111A Displaced fracture of greater trochanter of right femur, initial encounter for closed fracture: Secondary | ICD-10-CM | POA: Diagnosis not present

## 2015-01-22 DIAGNOSIS — M25552 Pain in left hip: Secondary | ICD-10-CM | POA: Diagnosis not present

## 2015-01-22 DIAGNOSIS — Z4789 Encounter for other orthopedic aftercare: Secondary | ICD-10-CM | POA: Diagnosis not present

## 2015-02-01 DIAGNOSIS — G450 Vertebro-basilar artery syndrome: Secondary | ICD-10-CM | POA: Diagnosis not present

## 2015-02-01 DIAGNOSIS — R079 Chest pain, unspecified: Secondary | ICD-10-CM | POA: Diagnosis not present

## 2015-02-28 DIAGNOSIS — M25552 Pain in left hip: Secondary | ICD-10-CM | POA: Diagnosis not present

## 2015-03-08 DIAGNOSIS — M25552 Pain in left hip: Secondary | ICD-10-CM | POA: Diagnosis not present

## 2015-03-08 DIAGNOSIS — M1612 Unilateral primary osteoarthritis, left hip: Secondary | ICD-10-CM | POA: Diagnosis not present

## 2015-03-16 DIAGNOSIS — G450 Vertebro-basilar artery syndrome: Secondary | ICD-10-CM | POA: Diagnosis not present

## 2015-03-16 DIAGNOSIS — D500 Iron deficiency anemia secondary to blood loss (chronic): Secondary | ICD-10-CM | POA: Diagnosis not present

## 2015-03-16 DIAGNOSIS — N182 Chronic kidney disease, stage 2 (mild): Secondary | ICD-10-CM | POA: Diagnosis not present

## 2015-03-16 DIAGNOSIS — E119 Type 2 diabetes mellitus without complications: Secondary | ICD-10-CM | POA: Diagnosis not present

## 2015-05-30 DIAGNOSIS — Z7901 Long term (current) use of anticoagulants: Secondary | ICD-10-CM | POA: Diagnosis not present

## 2015-05-30 DIAGNOSIS — Z5181 Encounter for therapeutic drug level monitoring: Secondary | ICD-10-CM | POA: Diagnosis not present

## 2015-06-17 HISTORY — PX: TOTAL HIP ARTHROPLASTY: SHX124

## 2015-06-25 DIAGNOSIS — Z96641 Presence of right artificial hip joint: Secondary | ICD-10-CM | POA: Insufficient documentation

## 2015-06-25 DIAGNOSIS — N184 Chronic kidney disease, stage 4 (severe): Secondary | ICD-10-CM | POA: Insufficient documentation

## 2015-06-25 DIAGNOSIS — E785 Hyperlipidemia, unspecified: Secondary | ICD-10-CM | POA: Insufficient documentation

## 2015-06-25 DIAGNOSIS — G459 Transient cerebral ischemic attack, unspecified: Secondary | ICD-10-CM | POA: Insufficient documentation

## 2015-06-25 DIAGNOSIS — I10 Essential (primary) hypertension: Secondary | ICD-10-CM | POA: Insufficient documentation

## 2015-06-25 DIAGNOSIS — E039 Hypothyroidism, unspecified: Secondary | ICD-10-CM | POA: Insufficient documentation

## 2015-06-28 DIAGNOSIS — R000 Tachycardia, unspecified: Secondary | ICD-10-CM | POA: Insufficient documentation

## 2015-06-29 DIAGNOSIS — D649 Anemia, unspecified: Secondary | ICD-10-CM | POA: Insufficient documentation

## 2015-11-20 ENCOUNTER — Other Ambulatory Visit: Payer: Self-pay | Admitting: Internal Medicine

## 2015-11-20 DIAGNOSIS — Z1239 Encounter for other screening for malignant neoplasm of breast: Secondary | ICD-10-CM

## 2015-12-11 ENCOUNTER — Ambulatory Visit
Admission: RE | Admit: 2015-12-11 | Discharge: 2015-12-11 | Disposition: A | Discharge status: Home / Self Care | Payer: Medicare HMO | Source: Ambulatory Visit | Attending: Internal Medicine | Admitting: Internal Medicine

## 2015-12-11 ENCOUNTER — Other Ambulatory Visit: Payer: Self-pay | Admitting: Internal Medicine

## 2015-12-11 DIAGNOSIS — Z1231 Encounter for screening mammogram for malignant neoplasm of breast: Secondary | ICD-10-CM | POA: Diagnosis not present

## 2015-12-11 DIAGNOSIS — Z1239 Encounter for other screening for malignant neoplasm of breast: Secondary | ICD-10-CM

## 2016-01-31 DIAGNOSIS — N183 Chronic kidney disease, stage 3 (moderate): Secondary | ICD-10-CM | POA: Diagnosis not present

## 2016-01-31 DIAGNOSIS — E1122 Type 2 diabetes mellitus with diabetic chronic kidney disease: Secondary | ICD-10-CM | POA: Diagnosis not present

## 2016-01-31 DIAGNOSIS — Z794 Long term (current) use of insulin: Secondary | ICD-10-CM | POA: Diagnosis not present

## 2016-03-07 DIAGNOSIS — Z23 Encounter for immunization: Secondary | ICD-10-CM | POA: Diagnosis not present

## 2016-03-20 DIAGNOSIS — H52223 Regular astigmatism, bilateral: Secondary | ICD-10-CM | POA: Diagnosis not present

## 2016-03-20 DIAGNOSIS — H524 Presbyopia: Secondary | ICD-10-CM | POA: Diagnosis not present

## 2016-03-20 DIAGNOSIS — E119 Type 2 diabetes mellitus without complications: Secondary | ICD-10-CM | POA: Diagnosis not present

## 2016-03-20 DIAGNOSIS — H5203 Hypermetropia, bilateral: Secondary | ICD-10-CM | POA: Diagnosis not present

## 2016-04-25 DIAGNOSIS — N183 Chronic kidney disease, stage 3 (moderate): Secondary | ICD-10-CM | POA: Diagnosis not present

## 2016-04-25 DIAGNOSIS — Z794 Long term (current) use of insulin: Secondary | ICD-10-CM | POA: Diagnosis not present

## 2016-04-25 DIAGNOSIS — E1122 Type 2 diabetes mellitus with diabetic chronic kidney disease: Secondary | ICD-10-CM | POA: Diagnosis not present

## 2016-05-02 DIAGNOSIS — E875 Hyperkalemia: Secondary | ICD-10-CM | POA: Diagnosis not present

## 2016-05-02 DIAGNOSIS — N183 Chronic kidney disease, stage 3 (moderate): Secondary | ICD-10-CM | POA: Diagnosis not present

## 2016-05-02 DIAGNOSIS — E7800 Pure hypercholesterolemia, unspecified: Secondary | ICD-10-CM | POA: Diagnosis not present

## 2016-05-02 DIAGNOSIS — Z794 Long term (current) use of insulin: Secondary | ICD-10-CM | POA: Diagnosis not present

## 2016-05-02 DIAGNOSIS — I10 Essential (primary) hypertension: Secondary | ICD-10-CM | POA: Diagnosis not present

## 2016-05-02 DIAGNOSIS — E1122 Type 2 diabetes mellitus with diabetic chronic kidney disease: Secondary | ICD-10-CM | POA: Diagnosis not present

## 2016-05-03 DIAGNOSIS — E7800 Pure hypercholesterolemia, unspecified: Secondary | ICD-10-CM | POA: Insufficient documentation

## 2016-06-16 HISTORY — PX: TOTAL HIP ARTHROPLASTY: SHX124

## 2016-07-28 DIAGNOSIS — N183 Chronic kidney disease, stage 3 (moderate): Secondary | ICD-10-CM | POA: Diagnosis not present

## 2016-07-28 DIAGNOSIS — Z794 Long term (current) use of insulin: Secondary | ICD-10-CM | POA: Diagnosis not present

## 2016-07-28 DIAGNOSIS — E1122 Type 2 diabetes mellitus with diabetic chronic kidney disease: Secondary | ICD-10-CM | POA: Diagnosis not present

## 2016-08-04 DIAGNOSIS — Z8673 Personal history of transient ischemic attack (TIA), and cerebral infarction without residual deficits: Secondary | ICD-10-CM | POA: Diagnosis not present

## 2016-08-04 DIAGNOSIS — E039 Hypothyroidism, unspecified: Secondary | ICD-10-CM | POA: Diagnosis not present

## 2016-08-04 DIAGNOSIS — N183 Chronic kidney disease, stage 3 (moderate): Secondary | ICD-10-CM | POA: Diagnosis not present

## 2016-08-04 DIAGNOSIS — I10 Essential (primary) hypertension: Secondary | ICD-10-CM | POA: Diagnosis not present

## 2016-08-04 DIAGNOSIS — Z794 Long term (current) use of insulin: Secondary | ICD-10-CM | POA: Diagnosis not present

## 2016-08-04 DIAGNOSIS — E1122 Type 2 diabetes mellitus with diabetic chronic kidney disease: Secondary | ICD-10-CM | POA: Diagnosis not present

## 2016-08-04 DIAGNOSIS — E7800 Pure hypercholesterolemia, unspecified: Secondary | ICD-10-CM | POA: Diagnosis not present

## 2016-08-18 DIAGNOSIS — M1611 Unilateral primary osteoarthritis, right hip: Secondary | ICD-10-CM | POA: Diagnosis not present

## 2016-08-18 DIAGNOSIS — T84018A Broken internal joint prosthesis, other site, initial encounter: Secondary | ICD-10-CM | POA: Diagnosis not present

## 2016-08-18 DIAGNOSIS — Z96649 Presence of unspecified artificial hip joint: Secondary | ICD-10-CM | POA: Diagnosis not present

## 2016-08-18 DIAGNOSIS — Z96642 Presence of left artificial hip joint: Secondary | ICD-10-CM | POA: Diagnosis not present

## 2016-08-18 DIAGNOSIS — Z96643 Presence of artificial hip joint, bilateral: Secondary | ICD-10-CM | POA: Diagnosis not present

## 2016-08-27 DIAGNOSIS — M25551 Pain in right hip: Secondary | ICD-10-CM | POA: Diagnosis not present

## 2016-08-27 DIAGNOSIS — M1611 Unilateral primary osteoarthritis, right hip: Secondary | ICD-10-CM | POA: Diagnosis not present

## 2016-08-27 DIAGNOSIS — Z96641 Presence of right artificial hip joint: Secondary | ICD-10-CM | POA: Diagnosis not present

## 2016-09-26 DIAGNOSIS — M160 Bilateral primary osteoarthritis of hip: Secondary | ICD-10-CM | POA: Diagnosis not present

## 2016-09-26 DIAGNOSIS — G459 Transient cerebral ischemic attack, unspecified: Secondary | ICD-10-CM | POA: Diagnosis not present

## 2016-09-26 DIAGNOSIS — Z7901 Long term (current) use of anticoagulants: Secondary | ICD-10-CM | POA: Diagnosis not present

## 2016-10-01 DIAGNOSIS — M259 Joint disorder, unspecified: Secondary | ICD-10-CM | POA: Diagnosis not present

## 2016-10-01 DIAGNOSIS — M1611 Unilateral primary osteoarthritis, right hip: Secondary | ICD-10-CM | POA: Diagnosis not present

## 2016-10-01 DIAGNOSIS — Z01818 Encounter for other preprocedural examination: Secondary | ICD-10-CM | POA: Diagnosis not present

## 2016-10-14 DIAGNOSIS — M6250 Muscle wasting and atrophy, not elsewhere classified, unspecified site: Secondary | ICD-10-CM | POA: Diagnosis not present

## 2016-10-14 DIAGNOSIS — Z471 Aftercare following joint replacement surgery: Secondary | ICD-10-CM | POA: Diagnosis not present

## 2016-10-14 DIAGNOSIS — D62 Acute posthemorrhagic anemia: Secondary | ICD-10-CM | POA: Diagnosis not present

## 2016-10-14 DIAGNOSIS — E1122 Type 2 diabetes mellitus with diabetic chronic kidney disease: Secondary | ICD-10-CM | POA: Diagnosis not present

## 2016-10-14 DIAGNOSIS — T84018A Broken internal joint prosthesis, other site, initial encounter: Secondary | ICD-10-CM | POA: Diagnosis not present

## 2016-10-14 DIAGNOSIS — E119 Type 2 diabetes mellitus without complications: Secondary | ICD-10-CM | POA: Diagnosis not present

## 2016-10-14 DIAGNOSIS — I129 Hypertensive chronic kidney disease with stage 1 through stage 4 chronic kidney disease, or unspecified chronic kidney disease: Secondary | ICD-10-CM | POA: Diagnosis not present

## 2016-10-14 DIAGNOSIS — Z96649 Presence of unspecified artificial hip joint: Secondary | ICD-10-CM | POA: Diagnosis not present

## 2016-10-14 DIAGNOSIS — E1165 Type 2 diabetes mellitus with hyperglycemia: Secondary | ICD-10-CM | POA: Diagnosis not present

## 2016-10-14 DIAGNOSIS — M1611 Unilateral primary osteoarthritis, right hip: Secondary | ICD-10-CM | POA: Diagnosis not present

## 2016-10-14 DIAGNOSIS — E039 Hypothyroidism, unspecified: Secondary | ICD-10-CM | POA: Diagnosis not present

## 2016-10-14 DIAGNOSIS — E785 Hyperlipidemia, unspecified: Secondary | ICD-10-CM | POA: Diagnosis not present

## 2016-10-14 DIAGNOSIS — T84090A Other mechanical complication of internal right hip prosthesis, initial encounter: Secondary | ICD-10-CM | POA: Diagnosis not present

## 2016-10-14 DIAGNOSIS — N183 Chronic kidney disease, stage 3 (moderate): Secondary | ICD-10-CM | POA: Diagnosis not present

## 2016-10-14 DIAGNOSIS — D509 Iron deficiency anemia, unspecified: Secondary | ICD-10-CM | POA: Diagnosis not present

## 2016-10-14 DIAGNOSIS — N182 Chronic kidney disease, stage 2 (mild): Secondary | ICD-10-CM | POA: Diagnosis not present

## 2016-10-14 DIAGNOSIS — Z96641 Presence of right artificial hip joint: Secondary | ICD-10-CM | POA: Diagnosis not present

## 2016-10-14 DIAGNOSIS — Z794 Long term (current) use of insulin: Secondary | ICD-10-CM | POA: Diagnosis not present

## 2016-10-14 DIAGNOSIS — T8451XA Infection and inflammatory reaction due to internal right hip prosthesis, initial encounter: Secondary | ICD-10-CM | POA: Diagnosis not present

## 2016-10-14 DIAGNOSIS — E1365 Other specified diabetes mellitus with hyperglycemia: Secondary | ICD-10-CM | POA: Diagnosis not present

## 2016-10-14 DIAGNOSIS — E1169 Type 2 diabetes mellitus with other specified complication: Secondary | ICD-10-CM | POA: Diagnosis not present

## 2016-10-14 DIAGNOSIS — Z8673 Personal history of transient ischemic attack (TIA), and cerebral infarction without residual deficits: Secondary | ICD-10-CM | POA: Diagnosis not present

## 2016-10-14 DIAGNOSIS — R52 Pain, unspecified: Secondary | ICD-10-CM | POA: Diagnosis not present

## 2016-10-14 DIAGNOSIS — R531 Weakness: Secondary | ICD-10-CM | POA: Diagnosis not present

## 2016-10-17 DIAGNOSIS — S79919A Unspecified injury of unspecified hip, initial encounter: Secondary | ICD-10-CM | POA: Diagnosis not present

## 2016-10-17 DIAGNOSIS — H9190 Unspecified hearing loss, unspecified ear: Secondary | ICD-10-CM | POA: Diagnosis not present

## 2016-10-17 DIAGNOSIS — Z471 Aftercare following joint replacement surgery: Secondary | ICD-10-CM | POA: Diagnosis not present

## 2016-10-17 DIAGNOSIS — N183 Chronic kidney disease, stage 3 (moderate): Secondary | ICD-10-CM | POA: Diagnosis not present

## 2016-10-17 DIAGNOSIS — N182 Chronic kidney disease, stage 2 (mild): Secondary | ICD-10-CM | POA: Diagnosis not present

## 2016-10-17 DIAGNOSIS — Z452 Encounter for adjustment and management of vascular access device: Secondary | ICD-10-CM | POA: Diagnosis not present

## 2016-10-17 DIAGNOSIS — Z794 Long term (current) use of insulin: Secondary | ICD-10-CM | POA: Diagnosis not present

## 2016-10-17 DIAGNOSIS — Z96641 Presence of right artificial hip joint: Secondary | ICD-10-CM | POA: Diagnosis not present

## 2016-10-17 DIAGNOSIS — D500 Iron deficiency anemia secondary to blood loss (chronic): Secondary | ICD-10-CM | POA: Diagnosis not present

## 2016-10-17 DIAGNOSIS — D62 Acute posthemorrhagic anemia: Secondary | ICD-10-CM | POA: Diagnosis not present

## 2016-10-17 DIAGNOSIS — Z09 Encounter for follow-up examination after completed treatment for conditions other than malignant neoplasm: Secondary | ICD-10-CM | POA: Diagnosis not present

## 2016-10-17 DIAGNOSIS — I129 Hypertensive chronic kidney disease with stage 1 through stage 4 chronic kidney disease, or unspecified chronic kidney disease: Secondary | ICD-10-CM | POA: Diagnosis not present

## 2016-10-17 DIAGNOSIS — R531 Weakness: Secondary | ICD-10-CM | POA: Diagnosis not present

## 2016-10-17 DIAGNOSIS — M6250 Muscle wasting and atrophy, not elsewhere classified, unspecified site: Secondary | ICD-10-CM | POA: Diagnosis not present

## 2016-10-17 DIAGNOSIS — T8451XA Infection and inflammatory reaction due to internal right hip prosthesis, initial encounter: Secondary | ICD-10-CM | POA: Diagnosis not present

## 2016-10-17 DIAGNOSIS — E785 Hyperlipidemia, unspecified: Secondary | ICD-10-CM | POA: Diagnosis not present

## 2016-10-17 DIAGNOSIS — E1165 Type 2 diabetes mellitus with hyperglycemia: Secondary | ICD-10-CM | POA: Diagnosis not present

## 2016-10-17 DIAGNOSIS — R52 Pain, unspecified: Secondary | ICD-10-CM | POA: Diagnosis not present

## 2016-10-17 DIAGNOSIS — E039 Hypothyroidism, unspecified: Secondary | ICD-10-CM | POA: Diagnosis not present

## 2016-10-17 DIAGNOSIS — E1122 Type 2 diabetes mellitus with diabetic chronic kidney disease: Secondary | ICD-10-CM | POA: Diagnosis not present

## 2016-10-17 DIAGNOSIS — B9689 Other specified bacterial agents as the cause of diseases classified elsewhere: Secondary | ICD-10-CM | POA: Diagnosis not present

## 2016-10-17 DIAGNOSIS — D509 Iron deficiency anemia, unspecified: Secondary | ICD-10-CM | POA: Diagnosis not present

## 2016-10-17 DIAGNOSIS — E1169 Type 2 diabetes mellitus with other specified complication: Secondary | ICD-10-CM | POA: Diagnosis not present

## 2016-10-21 DIAGNOSIS — Z96641 Presence of right artificial hip joint: Secondary | ICD-10-CM | POA: Diagnosis not present

## 2016-10-28 DIAGNOSIS — I129 Hypertensive chronic kidney disease with stage 1 through stage 4 chronic kidney disease, or unspecified chronic kidney disease: Secondary | ICD-10-CM | POA: Diagnosis not present

## 2016-10-28 DIAGNOSIS — N183 Chronic kidney disease, stage 3 (moderate): Secondary | ICD-10-CM | POA: Diagnosis not present

## 2016-10-28 DIAGNOSIS — E1169 Type 2 diabetes mellitus with other specified complication: Secondary | ICD-10-CM | POA: Diagnosis not present

## 2016-10-28 DIAGNOSIS — Z96641 Presence of right artificial hip joint: Secondary | ICD-10-CM | POA: Diagnosis not present

## 2016-10-28 DIAGNOSIS — B9689 Other specified bacterial agents as the cause of diseases classified elsewhere: Secondary | ICD-10-CM | POA: Diagnosis not present

## 2016-10-29 DIAGNOSIS — Z09 Encounter for follow-up examination after completed treatment for conditions other than malignant neoplasm: Secondary | ICD-10-CM | POA: Diagnosis not present

## 2016-10-29 DIAGNOSIS — Z96641 Presence of right artificial hip joint: Secondary | ICD-10-CM | POA: Diagnosis not present

## 2016-10-30 DIAGNOSIS — H9190 Unspecified hearing loss, unspecified ear: Secondary | ICD-10-CM | POA: Diagnosis not present

## 2016-10-30 DIAGNOSIS — Z471 Aftercare following joint replacement surgery: Secondary | ICD-10-CM | POA: Diagnosis not present

## 2016-10-30 DIAGNOSIS — E1165 Type 2 diabetes mellitus with hyperglycemia: Secondary | ICD-10-CM | POA: Diagnosis not present

## 2016-10-30 DIAGNOSIS — N183 Chronic kidney disease, stage 3 (moderate): Secondary | ICD-10-CM | POA: Diagnosis not present

## 2016-10-30 DIAGNOSIS — E1122 Type 2 diabetes mellitus with diabetic chronic kidney disease: Secondary | ICD-10-CM | POA: Diagnosis not present

## 2016-10-30 DIAGNOSIS — D500 Iron deficiency anemia secondary to blood loss (chronic): Secondary | ICD-10-CM | POA: Diagnosis not present

## 2016-10-30 DIAGNOSIS — D62 Acute posthemorrhagic anemia: Secondary | ICD-10-CM | POA: Diagnosis not present

## 2016-10-30 DIAGNOSIS — Z452 Encounter for adjustment and management of vascular access device: Secondary | ICD-10-CM | POA: Diagnosis not present

## 2016-10-30 DIAGNOSIS — Z96641 Presence of right artificial hip joint: Secondary | ICD-10-CM | POA: Diagnosis not present

## 2016-10-30 DIAGNOSIS — E785 Hyperlipidemia, unspecified: Secondary | ICD-10-CM | POA: Diagnosis not present

## 2016-10-30 DIAGNOSIS — E039 Hypothyroidism, unspecified: Secondary | ICD-10-CM | POA: Diagnosis not present

## 2016-10-30 DIAGNOSIS — I129 Hypertensive chronic kidney disease with stage 1 through stage 4 chronic kidney disease, or unspecified chronic kidney disease: Secondary | ICD-10-CM | POA: Diagnosis not present

## 2016-10-31 ENCOUNTER — Other Ambulatory Visit: Payer: Self-pay

## 2016-10-31 DIAGNOSIS — I129 Hypertensive chronic kidney disease with stage 1 through stage 4 chronic kidney disease, or unspecified chronic kidney disease: Secondary | ICD-10-CM | POA: Diagnosis not present

## 2016-10-31 DIAGNOSIS — D500 Iron deficiency anemia secondary to blood loss (chronic): Secondary | ICD-10-CM | POA: Diagnosis not present

## 2016-10-31 DIAGNOSIS — H9190 Unspecified hearing loss, unspecified ear: Secondary | ICD-10-CM | POA: Diagnosis not present

## 2016-10-31 DIAGNOSIS — E785 Hyperlipidemia, unspecified: Secondary | ICD-10-CM | POA: Diagnosis not present

## 2016-10-31 DIAGNOSIS — E1122 Type 2 diabetes mellitus with diabetic chronic kidney disease: Secondary | ICD-10-CM | POA: Diagnosis not present

## 2016-10-31 DIAGNOSIS — N183 Chronic kidney disease, stage 3 (moderate): Secondary | ICD-10-CM | POA: Diagnosis not present

## 2016-10-31 DIAGNOSIS — Z452 Encounter for adjustment and management of vascular access device: Secondary | ICD-10-CM | POA: Diagnosis not present

## 2016-10-31 DIAGNOSIS — Z471 Aftercare following joint replacement surgery: Secondary | ICD-10-CM | POA: Diagnosis not present

## 2016-10-31 DIAGNOSIS — E039 Hypothyroidism, unspecified: Secondary | ICD-10-CM | POA: Diagnosis not present

## 2016-10-31 NOTE — Patient Outreach (Signed)
[facility]) Care Management  10/31/2016  Mckenzie Wright 14-Oct-1945 [id number]     Transition of Care Referral  Referral Date: 10/31/16 Referral Source: [facility] Discharge Report Date of Admission: unknown Diagnosis: Right hip replacement Date of Discharge: Facility: [facility]: [facility] attempt # 1 to patient. Spoke with patient. She states she is doing well since discharge home. She voices that her son is assisting her as needed. He will be taking her to her f/u appts. She has PCP f/u appt with Dr. Candiss Norse on 11/07/16 and with surgeon on 11/26/16. Patient voices that she has all her meds. She knows when and how to take meds. She is aware of how and when to seek medical attention for changes in condition. Patient voices no RN CM needs or concerns at this time.   Plan: RN CM will notify [facility] administrative assistant of case status.   Enzo Montgomery, RN,BSN,CCM [place] Management Telephonic Care Management Coordinator Direct Phone: [phone] Toll Free: [phone] Fax: [phone]

## 2016-11-01 DIAGNOSIS — H9190 Unspecified hearing loss, unspecified ear: Secondary | ICD-10-CM | POA: Diagnosis not present

## 2016-11-01 DIAGNOSIS — E1122 Type 2 diabetes mellitus with diabetic chronic kidney disease: Secondary | ICD-10-CM | POA: Diagnosis not present

## 2016-11-01 DIAGNOSIS — E785 Hyperlipidemia, unspecified: Secondary | ICD-10-CM | POA: Diagnosis not present

## 2016-11-01 DIAGNOSIS — E039 Hypothyroidism, unspecified: Secondary | ICD-10-CM | POA: Diagnosis not present

## 2016-11-01 DIAGNOSIS — I129 Hypertensive chronic kidney disease with stage 1 through stage 4 chronic kidney disease, or unspecified chronic kidney disease: Secondary | ICD-10-CM | POA: Diagnosis not present

## 2016-11-01 DIAGNOSIS — Z452 Encounter for adjustment and management of vascular access device: Secondary | ICD-10-CM | POA: Diagnosis not present

## 2016-11-01 DIAGNOSIS — Z471 Aftercare following joint replacement surgery: Secondary | ICD-10-CM | POA: Diagnosis not present

## 2016-11-01 DIAGNOSIS — D500 Iron deficiency anemia secondary to blood loss (chronic): Secondary | ICD-10-CM | POA: Diagnosis not present

## 2016-11-01 DIAGNOSIS — N183 Chronic kidney disease, stage 3 (moderate): Secondary | ICD-10-CM | POA: Diagnosis not present

## 2016-11-02 DIAGNOSIS — E1122 Type 2 diabetes mellitus with diabetic chronic kidney disease: Secondary | ICD-10-CM | POA: Diagnosis not present

## 2016-11-02 DIAGNOSIS — H9190 Unspecified hearing loss, unspecified ear: Secondary | ICD-10-CM | POA: Diagnosis not present

## 2016-11-02 DIAGNOSIS — E039 Hypothyroidism, unspecified: Secondary | ICD-10-CM | POA: Diagnosis not present

## 2016-11-02 DIAGNOSIS — Z452 Encounter for adjustment and management of vascular access device: Secondary | ICD-10-CM | POA: Diagnosis not present

## 2016-11-02 DIAGNOSIS — D500 Iron deficiency anemia secondary to blood loss (chronic): Secondary | ICD-10-CM | POA: Diagnosis not present

## 2016-11-02 DIAGNOSIS — I129 Hypertensive chronic kidney disease with stage 1 through stage 4 chronic kidney disease, or unspecified chronic kidney disease: Secondary | ICD-10-CM | POA: Diagnosis not present

## 2016-11-02 DIAGNOSIS — Z471 Aftercare following joint replacement surgery: Secondary | ICD-10-CM | POA: Diagnosis not present

## 2016-11-02 DIAGNOSIS — N183 Chronic kidney disease, stage 3 (moderate): Secondary | ICD-10-CM | POA: Diagnosis not present

## 2016-11-02 DIAGNOSIS — E785 Hyperlipidemia, unspecified: Secondary | ICD-10-CM | POA: Diagnosis not present

## 2016-11-03 DIAGNOSIS — N183 Chronic kidney disease, stage 3 (moderate): Secondary | ICD-10-CM | POA: Diagnosis not present

## 2016-11-03 DIAGNOSIS — S79919A Unspecified injury of unspecified hip, initial encounter: Secondary | ICD-10-CM | POA: Diagnosis not present

## 2016-11-03 DIAGNOSIS — Z96641 Presence of right artificial hip joint: Secondary | ICD-10-CM | POA: Diagnosis not present

## 2016-11-03 DIAGNOSIS — E785 Hyperlipidemia, unspecified: Secondary | ICD-10-CM | POA: Diagnosis not present

## 2016-11-03 DIAGNOSIS — E1122 Type 2 diabetes mellitus with diabetic chronic kidney disease: Secondary | ICD-10-CM | POA: Diagnosis not present

## 2016-11-03 DIAGNOSIS — D500 Iron deficiency anemia secondary to blood loss (chronic): Secondary | ICD-10-CM | POA: Diagnosis not present

## 2016-11-03 DIAGNOSIS — Z471 Aftercare following joint replacement surgery: Secondary | ICD-10-CM | POA: Diagnosis not present

## 2016-11-03 DIAGNOSIS — Z452 Encounter for adjustment and management of vascular access device: Secondary | ICD-10-CM | POA: Diagnosis not present

## 2016-11-03 DIAGNOSIS — I129 Hypertensive chronic kidney disease with stage 1 through stage 4 chronic kidney disease, or unspecified chronic kidney disease: Secondary | ICD-10-CM | POA: Diagnosis not present

## 2016-11-03 DIAGNOSIS — H9190 Unspecified hearing loss, unspecified ear: Secondary | ICD-10-CM | POA: Diagnosis not present

## 2016-11-03 DIAGNOSIS — E039 Hypothyroidism, unspecified: Secondary | ICD-10-CM | POA: Diagnosis not present

## 2016-11-04 DIAGNOSIS — I129 Hypertensive chronic kidney disease with stage 1 through stage 4 chronic kidney disease, or unspecified chronic kidney disease: Secondary | ICD-10-CM | POA: Diagnosis not present

## 2016-11-04 DIAGNOSIS — E039 Hypothyroidism, unspecified: Secondary | ICD-10-CM | POA: Diagnosis not present

## 2016-11-04 DIAGNOSIS — D500 Iron deficiency anemia secondary to blood loss (chronic): Secondary | ICD-10-CM | POA: Diagnosis not present

## 2016-11-04 DIAGNOSIS — E1122 Type 2 diabetes mellitus with diabetic chronic kidney disease: Secondary | ICD-10-CM | POA: Diagnosis not present

## 2016-11-04 DIAGNOSIS — Z471 Aftercare following joint replacement surgery: Secondary | ICD-10-CM | POA: Diagnosis not present

## 2016-11-04 DIAGNOSIS — H9190 Unspecified hearing loss, unspecified ear: Secondary | ICD-10-CM | POA: Diagnosis not present

## 2016-11-04 DIAGNOSIS — E785 Hyperlipidemia, unspecified: Secondary | ICD-10-CM | POA: Diagnosis not present

## 2016-11-04 DIAGNOSIS — N183 Chronic kidney disease, stage 3 (moderate): Secondary | ICD-10-CM | POA: Diagnosis not present

## 2016-11-04 DIAGNOSIS — Z452 Encounter for adjustment and management of vascular access device: Secondary | ICD-10-CM | POA: Diagnosis not present

## 2016-11-05 DIAGNOSIS — H9190 Unspecified hearing loss, unspecified ear: Secondary | ICD-10-CM | POA: Diagnosis not present

## 2016-11-05 DIAGNOSIS — Z471 Aftercare following joint replacement surgery: Secondary | ICD-10-CM | POA: Diagnosis not present

## 2016-11-05 DIAGNOSIS — E039 Hypothyroidism, unspecified: Secondary | ICD-10-CM | POA: Diagnosis not present

## 2016-11-05 DIAGNOSIS — I129 Hypertensive chronic kidney disease with stage 1 through stage 4 chronic kidney disease, or unspecified chronic kidney disease: Secondary | ICD-10-CM | POA: Diagnosis not present

## 2016-11-05 DIAGNOSIS — N183 Chronic kidney disease, stage 3 (moderate): Secondary | ICD-10-CM | POA: Diagnosis not present

## 2016-11-05 DIAGNOSIS — Z452 Encounter for adjustment and management of vascular access device: Secondary | ICD-10-CM | POA: Diagnosis not present

## 2016-11-05 DIAGNOSIS — D500 Iron deficiency anemia secondary to blood loss (chronic): Secondary | ICD-10-CM | POA: Diagnosis not present

## 2016-11-05 DIAGNOSIS — E785 Hyperlipidemia, unspecified: Secondary | ICD-10-CM | POA: Diagnosis not present

## 2016-11-05 DIAGNOSIS — E1122 Type 2 diabetes mellitus with diabetic chronic kidney disease: Secondary | ICD-10-CM | POA: Diagnosis not present

## 2016-11-06 DIAGNOSIS — S79919A Unspecified injury of unspecified hip, initial encounter: Secondary | ICD-10-CM | POA: Diagnosis not present

## 2016-11-06 DIAGNOSIS — D500 Iron deficiency anemia secondary to blood loss (chronic): Secondary | ICD-10-CM | POA: Diagnosis not present

## 2016-11-06 DIAGNOSIS — E1122 Type 2 diabetes mellitus with diabetic chronic kidney disease: Secondary | ICD-10-CM | POA: Diagnosis not present

## 2016-11-06 DIAGNOSIS — I129 Hypertensive chronic kidney disease with stage 1 through stage 4 chronic kidney disease, or unspecified chronic kidney disease: Secondary | ICD-10-CM | POA: Diagnosis not present

## 2016-11-06 DIAGNOSIS — E785 Hyperlipidemia, unspecified: Secondary | ICD-10-CM | POA: Diagnosis not present

## 2016-11-06 DIAGNOSIS — N183 Chronic kidney disease, stage 3 (moderate): Secondary | ICD-10-CM | POA: Diagnosis not present

## 2016-11-06 DIAGNOSIS — Z96641 Presence of right artificial hip joint: Secondary | ICD-10-CM | POA: Diagnosis not present

## 2016-11-06 DIAGNOSIS — E039 Hypothyroidism, unspecified: Secondary | ICD-10-CM | POA: Diagnosis not present

## 2016-11-06 DIAGNOSIS — H9190 Unspecified hearing loss, unspecified ear: Secondary | ICD-10-CM | POA: Diagnosis not present

## 2016-11-06 DIAGNOSIS — Z452 Encounter for adjustment and management of vascular access device: Secondary | ICD-10-CM | POA: Diagnosis not present

## 2016-11-06 DIAGNOSIS — Z471 Aftercare following joint replacement surgery: Secondary | ICD-10-CM | POA: Diagnosis not present

## 2016-11-07 DIAGNOSIS — Z471 Aftercare following joint replacement surgery: Secondary | ICD-10-CM | POA: Diagnosis not present

## 2016-11-07 DIAGNOSIS — E1122 Type 2 diabetes mellitus with diabetic chronic kidney disease: Secondary | ICD-10-CM | POA: Diagnosis not present

## 2016-11-07 DIAGNOSIS — E785 Hyperlipidemia, unspecified: Secondary | ICD-10-CM | POA: Diagnosis not present

## 2016-11-07 DIAGNOSIS — Z452 Encounter for adjustment and management of vascular access device: Secondary | ICD-10-CM | POA: Diagnosis not present

## 2016-11-07 DIAGNOSIS — H9190 Unspecified hearing loss, unspecified ear: Secondary | ICD-10-CM | POA: Diagnosis not present

## 2016-11-07 DIAGNOSIS — D649 Anemia, unspecified: Secondary | ICD-10-CM | POA: Diagnosis not present

## 2016-11-07 DIAGNOSIS — I10 Essential (primary) hypertension: Secondary | ICD-10-CM | POA: Diagnosis not present

## 2016-11-07 DIAGNOSIS — N183 Chronic kidney disease, stage 3 (moderate): Secondary | ICD-10-CM | POA: Diagnosis not present

## 2016-11-07 DIAGNOSIS — E039 Hypothyroidism, unspecified: Secondary | ICD-10-CM | POA: Diagnosis not present

## 2016-11-07 DIAGNOSIS — D500 Iron deficiency anemia secondary to blood loss (chronic): Secondary | ICD-10-CM | POA: Diagnosis not present

## 2016-11-07 DIAGNOSIS — E7800 Pure hypercholesterolemia, unspecified: Secondary | ICD-10-CM | POA: Diagnosis not present

## 2016-11-07 DIAGNOSIS — I129 Hypertensive chronic kidney disease with stage 1 through stage 4 chronic kidney disease, or unspecified chronic kidney disease: Secondary | ICD-10-CM | POA: Diagnosis not present

## 2016-11-10 DIAGNOSIS — E1122 Type 2 diabetes mellitus with diabetic chronic kidney disease: Secondary | ICD-10-CM | POA: Diagnosis not present

## 2016-11-10 DIAGNOSIS — E785 Hyperlipidemia, unspecified: Secondary | ICD-10-CM | POA: Diagnosis not present

## 2016-11-10 DIAGNOSIS — N183 Chronic kidney disease, stage 3 (moderate): Secondary | ICD-10-CM | POA: Diagnosis not present

## 2016-11-10 DIAGNOSIS — E039 Hypothyroidism, unspecified: Secondary | ICD-10-CM | POA: Diagnosis not present

## 2016-11-10 DIAGNOSIS — H9190 Unspecified hearing loss, unspecified ear: Secondary | ICD-10-CM | POA: Diagnosis not present

## 2016-11-10 DIAGNOSIS — Z471 Aftercare following joint replacement surgery: Secondary | ICD-10-CM | POA: Diagnosis not present

## 2016-11-10 DIAGNOSIS — D500 Iron deficiency anemia secondary to blood loss (chronic): Secondary | ICD-10-CM | POA: Diagnosis not present

## 2016-11-10 DIAGNOSIS — Z452 Encounter for adjustment and management of vascular access device: Secondary | ICD-10-CM | POA: Diagnosis not present

## 2016-11-10 DIAGNOSIS — I129 Hypertensive chronic kidney disease with stage 1 through stage 4 chronic kidney disease, or unspecified chronic kidney disease: Secondary | ICD-10-CM | POA: Diagnosis not present

## 2016-11-11 DIAGNOSIS — E785 Hyperlipidemia, unspecified: Secondary | ICD-10-CM | POA: Diagnosis not present

## 2016-11-11 DIAGNOSIS — S79919A Unspecified injury of unspecified hip, initial encounter: Secondary | ICD-10-CM | POA: Diagnosis not present

## 2016-11-11 DIAGNOSIS — H9190 Unspecified hearing loss, unspecified ear: Secondary | ICD-10-CM | POA: Diagnosis not present

## 2016-11-11 DIAGNOSIS — I129 Hypertensive chronic kidney disease with stage 1 through stage 4 chronic kidney disease, or unspecified chronic kidney disease: Secondary | ICD-10-CM | POA: Diagnosis not present

## 2016-11-11 DIAGNOSIS — Z471 Aftercare following joint replacement surgery: Secondary | ICD-10-CM | POA: Diagnosis not present

## 2016-11-11 DIAGNOSIS — E1122 Type 2 diabetes mellitus with diabetic chronic kidney disease: Secondary | ICD-10-CM | POA: Diagnosis not present

## 2016-11-11 DIAGNOSIS — N183 Chronic kidney disease, stage 3 (moderate): Secondary | ICD-10-CM | POA: Diagnosis not present

## 2016-11-11 DIAGNOSIS — Z96641 Presence of right artificial hip joint: Secondary | ICD-10-CM | POA: Diagnosis not present

## 2016-11-11 DIAGNOSIS — Z452 Encounter for adjustment and management of vascular access device: Secondary | ICD-10-CM | POA: Diagnosis not present

## 2016-11-11 DIAGNOSIS — D500 Iron deficiency anemia secondary to blood loss (chronic): Secondary | ICD-10-CM | POA: Diagnosis not present

## 2016-11-11 DIAGNOSIS — E039 Hypothyroidism, unspecified: Secondary | ICD-10-CM | POA: Diagnosis not present

## 2016-11-12 DIAGNOSIS — Z452 Encounter for adjustment and management of vascular access device: Secondary | ICD-10-CM | POA: Diagnosis not present

## 2016-11-12 DIAGNOSIS — E1122 Type 2 diabetes mellitus with diabetic chronic kidney disease: Secondary | ICD-10-CM | POA: Diagnosis not present

## 2016-11-12 DIAGNOSIS — S79919A Unspecified injury of unspecified hip, initial encounter: Secondary | ICD-10-CM | POA: Diagnosis not present

## 2016-11-12 DIAGNOSIS — E785 Hyperlipidemia, unspecified: Secondary | ICD-10-CM | POA: Diagnosis not present

## 2016-11-12 DIAGNOSIS — D500 Iron deficiency anemia secondary to blood loss (chronic): Secondary | ICD-10-CM | POA: Diagnosis not present

## 2016-11-12 DIAGNOSIS — Z96641 Presence of right artificial hip joint: Secondary | ICD-10-CM | POA: Diagnosis not present

## 2016-11-12 DIAGNOSIS — E039 Hypothyroidism, unspecified: Secondary | ICD-10-CM | POA: Diagnosis not present

## 2016-11-12 DIAGNOSIS — I129 Hypertensive chronic kidney disease with stage 1 through stage 4 chronic kidney disease, or unspecified chronic kidney disease: Secondary | ICD-10-CM | POA: Diagnosis not present

## 2016-11-12 DIAGNOSIS — H9190 Unspecified hearing loss, unspecified ear: Secondary | ICD-10-CM | POA: Diagnosis not present

## 2016-11-12 DIAGNOSIS — Z471 Aftercare following joint replacement surgery: Secondary | ICD-10-CM | POA: Diagnosis not present

## 2016-11-12 DIAGNOSIS — N183 Chronic kidney disease, stage 3 (moderate): Secondary | ICD-10-CM | POA: Diagnosis not present

## 2016-11-13 DIAGNOSIS — H9190 Unspecified hearing loss, unspecified ear: Secondary | ICD-10-CM | POA: Diagnosis not present

## 2016-11-13 DIAGNOSIS — D500 Iron deficiency anemia secondary to blood loss (chronic): Secondary | ICD-10-CM | POA: Diagnosis not present

## 2016-11-13 DIAGNOSIS — I129 Hypertensive chronic kidney disease with stage 1 through stage 4 chronic kidney disease, or unspecified chronic kidney disease: Secondary | ICD-10-CM | POA: Diagnosis not present

## 2016-11-13 DIAGNOSIS — E039 Hypothyroidism, unspecified: Secondary | ICD-10-CM | POA: Diagnosis not present

## 2016-11-13 DIAGNOSIS — E785 Hyperlipidemia, unspecified: Secondary | ICD-10-CM | POA: Diagnosis not present

## 2016-11-13 DIAGNOSIS — E1122 Type 2 diabetes mellitus with diabetic chronic kidney disease: Secondary | ICD-10-CM | POA: Diagnosis not present

## 2016-11-13 DIAGNOSIS — N183 Chronic kidney disease, stage 3 (moderate): Secondary | ICD-10-CM | POA: Diagnosis not present

## 2016-11-13 DIAGNOSIS — Z471 Aftercare following joint replacement surgery: Secondary | ICD-10-CM | POA: Diagnosis not present

## 2016-11-13 DIAGNOSIS — Z452 Encounter for adjustment and management of vascular access device: Secondary | ICD-10-CM | POA: Diagnosis not present

## 2016-11-17 DIAGNOSIS — S79919A Unspecified injury of unspecified hip, initial encounter: Secondary | ICD-10-CM | POA: Diagnosis not present

## 2016-11-17 DIAGNOSIS — Z452 Encounter for adjustment and management of vascular access device: Secondary | ICD-10-CM | POA: Diagnosis not present

## 2016-11-17 DIAGNOSIS — N183 Chronic kidney disease, stage 3 (moderate): Secondary | ICD-10-CM | POA: Diagnosis not present

## 2016-11-17 DIAGNOSIS — Z96641 Presence of right artificial hip joint: Secondary | ICD-10-CM | POA: Diagnosis not present

## 2016-11-17 DIAGNOSIS — Z471 Aftercare following joint replacement surgery: Secondary | ICD-10-CM | POA: Diagnosis not present

## 2016-11-17 DIAGNOSIS — E039 Hypothyroidism, unspecified: Secondary | ICD-10-CM | POA: Diagnosis not present

## 2016-11-17 DIAGNOSIS — I129 Hypertensive chronic kidney disease with stage 1 through stage 4 chronic kidney disease, or unspecified chronic kidney disease: Secondary | ICD-10-CM | POA: Diagnosis not present

## 2016-11-17 DIAGNOSIS — E785 Hyperlipidemia, unspecified: Secondary | ICD-10-CM | POA: Diagnosis not present

## 2016-11-17 DIAGNOSIS — D500 Iron deficiency anemia secondary to blood loss (chronic): Secondary | ICD-10-CM | POA: Diagnosis not present

## 2016-11-17 DIAGNOSIS — E1122 Type 2 diabetes mellitus with diabetic chronic kidney disease: Secondary | ICD-10-CM | POA: Diagnosis not present

## 2016-11-17 DIAGNOSIS — H9190 Unspecified hearing loss, unspecified ear: Secondary | ICD-10-CM | POA: Diagnosis not present

## 2016-11-19 DIAGNOSIS — Z96641 Presence of right artificial hip joint: Secondary | ICD-10-CM | POA: Diagnosis not present

## 2016-11-19 DIAGNOSIS — S79919A Unspecified injury of unspecified hip, initial encounter: Secondary | ICD-10-CM | POA: Diagnosis not present

## 2016-11-20 DIAGNOSIS — N183 Chronic kidney disease, stage 3 (moderate): Secondary | ICD-10-CM | POA: Diagnosis not present

## 2016-11-20 DIAGNOSIS — E785 Hyperlipidemia, unspecified: Secondary | ICD-10-CM | POA: Diagnosis not present

## 2016-11-20 DIAGNOSIS — H9190 Unspecified hearing loss, unspecified ear: Secondary | ICD-10-CM | POA: Diagnosis not present

## 2016-11-20 DIAGNOSIS — I129 Hypertensive chronic kidney disease with stage 1 through stage 4 chronic kidney disease, or unspecified chronic kidney disease: Secondary | ICD-10-CM | POA: Diagnosis not present

## 2016-11-20 DIAGNOSIS — Z452 Encounter for adjustment and management of vascular access device: Secondary | ICD-10-CM | POA: Diagnosis not present

## 2016-11-20 DIAGNOSIS — Z471 Aftercare following joint replacement surgery: Secondary | ICD-10-CM | POA: Diagnosis not present

## 2016-11-20 DIAGNOSIS — D500 Iron deficiency anemia secondary to blood loss (chronic): Secondary | ICD-10-CM | POA: Diagnosis not present

## 2016-11-20 DIAGNOSIS — E039 Hypothyroidism, unspecified: Secondary | ICD-10-CM | POA: Diagnosis not present

## 2016-11-20 DIAGNOSIS — E1122 Type 2 diabetes mellitus with diabetic chronic kidney disease: Secondary | ICD-10-CM | POA: Diagnosis not present

## 2016-11-24 DIAGNOSIS — E785 Hyperlipidemia, unspecified: Secondary | ICD-10-CM | POA: Diagnosis not present

## 2016-11-24 DIAGNOSIS — I129 Hypertensive chronic kidney disease with stage 1 through stage 4 chronic kidney disease, or unspecified chronic kidney disease: Secondary | ICD-10-CM | POA: Diagnosis not present

## 2016-11-24 DIAGNOSIS — D500 Iron deficiency anemia secondary to blood loss (chronic): Secondary | ICD-10-CM | POA: Diagnosis not present

## 2016-11-24 DIAGNOSIS — E039 Hypothyroidism, unspecified: Secondary | ICD-10-CM | POA: Diagnosis not present

## 2016-11-24 DIAGNOSIS — H9190 Unspecified hearing loss, unspecified ear: Secondary | ICD-10-CM | POA: Diagnosis not present

## 2016-11-24 DIAGNOSIS — Z96641 Presence of right artificial hip joint: Secondary | ICD-10-CM | POA: Diagnosis not present

## 2016-11-24 DIAGNOSIS — S79919A Unspecified injury of unspecified hip, initial encounter: Secondary | ICD-10-CM | POA: Diagnosis not present

## 2016-11-24 DIAGNOSIS — E1122 Type 2 diabetes mellitus with diabetic chronic kidney disease: Secondary | ICD-10-CM | POA: Diagnosis not present

## 2016-11-24 DIAGNOSIS — N183 Chronic kidney disease, stage 3 (moderate): Secondary | ICD-10-CM | POA: Diagnosis not present

## 2016-11-24 DIAGNOSIS — Z452 Encounter for adjustment and management of vascular access device: Secondary | ICD-10-CM | POA: Diagnosis not present

## 2016-11-24 DIAGNOSIS — Z471 Aftercare following joint replacement surgery: Secondary | ICD-10-CM | POA: Diagnosis not present

## 2016-11-25 DIAGNOSIS — Z96641 Presence of right artificial hip joint: Secondary | ICD-10-CM | POA: Diagnosis not present

## 2016-11-25 DIAGNOSIS — S79919A Unspecified injury of unspecified hip, initial encounter: Secondary | ICD-10-CM | POA: Diagnosis not present

## 2016-11-26 DIAGNOSIS — Z471 Aftercare following joint replacement surgery: Secondary | ICD-10-CM | POA: Diagnosis not present

## 2016-11-26 DIAGNOSIS — Z96643 Presence of artificial hip joint, bilateral: Secondary | ICD-10-CM | POA: Diagnosis not present

## 2016-11-26 DIAGNOSIS — R937 Abnormal findings on diagnostic imaging of other parts of musculoskeletal system: Secondary | ICD-10-CM | POA: Diagnosis not present

## 2016-11-26 DIAGNOSIS — Z96641 Presence of right artificial hip joint: Secondary | ICD-10-CM | POA: Diagnosis not present

## 2016-11-27 DIAGNOSIS — E785 Hyperlipidemia, unspecified: Secondary | ICD-10-CM | POA: Diagnosis not present

## 2016-11-27 DIAGNOSIS — H9190 Unspecified hearing loss, unspecified ear: Secondary | ICD-10-CM | POA: Diagnosis not present

## 2016-11-27 DIAGNOSIS — D500 Iron deficiency anemia secondary to blood loss (chronic): Secondary | ICD-10-CM | POA: Diagnosis not present

## 2016-11-27 DIAGNOSIS — E039 Hypothyroidism, unspecified: Secondary | ICD-10-CM | POA: Diagnosis not present

## 2016-11-27 DIAGNOSIS — E1122 Type 2 diabetes mellitus with diabetic chronic kidney disease: Secondary | ICD-10-CM | POA: Diagnosis not present

## 2016-11-27 DIAGNOSIS — Z452 Encounter for adjustment and management of vascular access device: Secondary | ICD-10-CM | POA: Diagnosis not present

## 2016-11-27 DIAGNOSIS — I129 Hypertensive chronic kidney disease with stage 1 through stage 4 chronic kidney disease, or unspecified chronic kidney disease: Secondary | ICD-10-CM | POA: Diagnosis not present

## 2016-11-27 DIAGNOSIS — Z471 Aftercare following joint replacement surgery: Secondary | ICD-10-CM | POA: Diagnosis not present

## 2016-11-27 DIAGNOSIS — N183 Chronic kidney disease, stage 3 (moderate): Secondary | ICD-10-CM | POA: Diagnosis not present

## 2016-12-01 DIAGNOSIS — Z452 Encounter for adjustment and management of vascular access device: Secondary | ICD-10-CM | POA: Diagnosis not present

## 2016-12-01 DIAGNOSIS — Z96641 Presence of right artificial hip joint: Secondary | ICD-10-CM | POA: Diagnosis not present

## 2016-12-01 DIAGNOSIS — D500 Iron deficiency anemia secondary to blood loss (chronic): Secondary | ICD-10-CM | POA: Diagnosis not present

## 2016-12-01 DIAGNOSIS — H9190 Unspecified hearing loss, unspecified ear: Secondary | ICD-10-CM | POA: Diagnosis not present

## 2016-12-01 DIAGNOSIS — I129 Hypertensive chronic kidney disease with stage 1 through stage 4 chronic kidney disease, or unspecified chronic kidney disease: Secondary | ICD-10-CM | POA: Diagnosis not present

## 2016-12-01 DIAGNOSIS — N183 Chronic kidney disease, stage 3 (moderate): Secondary | ICD-10-CM | POA: Diagnosis not present

## 2016-12-01 DIAGNOSIS — E039 Hypothyroidism, unspecified: Secondary | ICD-10-CM | POA: Diagnosis not present

## 2016-12-01 DIAGNOSIS — Z471 Aftercare following joint replacement surgery: Secondary | ICD-10-CM | POA: Diagnosis not present

## 2016-12-01 DIAGNOSIS — E1122 Type 2 diabetes mellitus with diabetic chronic kidney disease: Secondary | ICD-10-CM | POA: Diagnosis not present

## 2016-12-01 DIAGNOSIS — S79919A Unspecified injury of unspecified hip, initial encounter: Secondary | ICD-10-CM | POA: Diagnosis not present

## 2016-12-01 DIAGNOSIS — E785 Hyperlipidemia, unspecified: Secondary | ICD-10-CM | POA: Diagnosis not present

## 2016-12-02 DIAGNOSIS — S79919A Unspecified injury of unspecified hip, initial encounter: Secondary | ICD-10-CM | POA: Diagnosis not present

## 2016-12-02 DIAGNOSIS — Z96641 Presence of right artificial hip joint: Secondary | ICD-10-CM | POA: Diagnosis not present

## 2016-12-04 DIAGNOSIS — D500 Iron deficiency anemia secondary to blood loss (chronic): Secondary | ICD-10-CM | POA: Diagnosis not present

## 2016-12-04 DIAGNOSIS — N183 Chronic kidney disease, stage 3 (moderate): Secondary | ICD-10-CM | POA: Diagnosis not present

## 2016-12-04 DIAGNOSIS — E785 Hyperlipidemia, unspecified: Secondary | ICD-10-CM | POA: Diagnosis not present

## 2016-12-04 DIAGNOSIS — Z471 Aftercare following joint replacement surgery: Secondary | ICD-10-CM | POA: Diagnosis not present

## 2016-12-04 DIAGNOSIS — I129 Hypertensive chronic kidney disease with stage 1 through stage 4 chronic kidney disease, or unspecified chronic kidney disease: Secondary | ICD-10-CM | POA: Diagnosis not present

## 2016-12-04 DIAGNOSIS — H9190 Unspecified hearing loss, unspecified ear: Secondary | ICD-10-CM | POA: Diagnosis not present

## 2016-12-04 DIAGNOSIS — E039 Hypothyroidism, unspecified: Secondary | ICD-10-CM | POA: Diagnosis not present

## 2016-12-04 DIAGNOSIS — E1122 Type 2 diabetes mellitus with diabetic chronic kidney disease: Secondary | ICD-10-CM | POA: Diagnosis not present

## 2016-12-04 DIAGNOSIS — Z452 Encounter for adjustment and management of vascular access device: Secondary | ICD-10-CM | POA: Diagnosis not present

## 2016-12-08 DIAGNOSIS — N183 Chronic kidney disease, stage 3 (moderate): Secondary | ICD-10-CM | POA: Diagnosis not present

## 2016-12-08 DIAGNOSIS — Z471 Aftercare following joint replacement surgery: Secondary | ICD-10-CM | POA: Diagnosis not present

## 2016-12-08 DIAGNOSIS — H9190 Unspecified hearing loss, unspecified ear: Secondary | ICD-10-CM | POA: Diagnosis not present

## 2016-12-08 DIAGNOSIS — Z96641 Presence of right artificial hip joint: Secondary | ICD-10-CM | POA: Diagnosis not present

## 2016-12-08 DIAGNOSIS — E039 Hypothyroidism, unspecified: Secondary | ICD-10-CM | POA: Diagnosis not present

## 2016-12-08 DIAGNOSIS — D500 Iron deficiency anemia secondary to blood loss (chronic): Secondary | ICD-10-CM | POA: Diagnosis not present

## 2016-12-08 DIAGNOSIS — I129 Hypertensive chronic kidney disease with stage 1 through stage 4 chronic kidney disease, or unspecified chronic kidney disease: Secondary | ICD-10-CM | POA: Diagnosis not present

## 2016-12-08 DIAGNOSIS — E1122 Type 2 diabetes mellitus with diabetic chronic kidney disease: Secondary | ICD-10-CM | POA: Diagnosis not present

## 2016-12-08 DIAGNOSIS — Z452 Encounter for adjustment and management of vascular access device: Secondary | ICD-10-CM | POA: Diagnosis not present

## 2016-12-08 DIAGNOSIS — E785 Hyperlipidemia, unspecified: Secondary | ICD-10-CM | POA: Diagnosis not present

## 2016-12-08 DIAGNOSIS — S79919A Unspecified injury of unspecified hip, initial encounter: Secondary | ICD-10-CM | POA: Diagnosis not present

## 2016-12-12 DIAGNOSIS — D500 Iron deficiency anemia secondary to blood loss (chronic): Secondary | ICD-10-CM | POA: Diagnosis not present

## 2016-12-12 DIAGNOSIS — E785 Hyperlipidemia, unspecified: Secondary | ICD-10-CM | POA: Diagnosis not present

## 2016-12-12 DIAGNOSIS — Z452 Encounter for adjustment and management of vascular access device: Secondary | ICD-10-CM | POA: Diagnosis not present

## 2016-12-12 DIAGNOSIS — H9190 Unspecified hearing loss, unspecified ear: Secondary | ICD-10-CM | POA: Diagnosis not present

## 2016-12-12 DIAGNOSIS — Z471 Aftercare following joint replacement surgery: Secondary | ICD-10-CM | POA: Diagnosis not present

## 2016-12-12 DIAGNOSIS — I129 Hypertensive chronic kidney disease with stage 1 through stage 4 chronic kidney disease, or unspecified chronic kidney disease: Secondary | ICD-10-CM | POA: Diagnosis not present

## 2016-12-12 DIAGNOSIS — E1122 Type 2 diabetes mellitus with diabetic chronic kidney disease: Secondary | ICD-10-CM | POA: Diagnosis not present

## 2016-12-12 DIAGNOSIS — E039 Hypothyroidism, unspecified: Secondary | ICD-10-CM | POA: Diagnosis not present

## 2016-12-12 DIAGNOSIS — N183 Chronic kidney disease, stage 3 (moderate): Secondary | ICD-10-CM | POA: Diagnosis not present

## 2016-12-18 DIAGNOSIS — Z96641 Presence of right artificial hip joint: Secondary | ICD-10-CM | POA: Diagnosis not present

## 2016-12-18 DIAGNOSIS — T84090D Other mechanical complication of internal right hip prosthesis, subsequent encounter: Secondary | ICD-10-CM | POA: Diagnosis not present

## 2016-12-18 DIAGNOSIS — Z96649 Presence of unspecified artificial hip joint: Secondary | ICD-10-CM | POA: Diagnosis not present

## 2016-12-18 DIAGNOSIS — T8450XA Infection and inflammatory reaction due to unspecified internal joint prosthesis, initial encounter: Secondary | ICD-10-CM | POA: Diagnosis not present

## 2016-12-18 DIAGNOSIS — T84018D Broken internal joint prosthesis, other site, subsequent encounter: Secondary | ICD-10-CM | POA: Diagnosis not present

## 2016-12-18 DIAGNOSIS — T8451XD Infection and inflammatory reaction due to internal right hip prosthesis, subsequent encounter: Secondary | ICD-10-CM | POA: Diagnosis not present

## 2016-12-19 DIAGNOSIS — Z794 Long term (current) use of insulin: Secondary | ICD-10-CM | POA: Diagnosis not present

## 2016-12-19 DIAGNOSIS — Z471 Aftercare following joint replacement surgery: Secondary | ICD-10-CM | POA: Diagnosis not present

## 2016-12-19 DIAGNOSIS — N183 Chronic kidney disease, stage 3 (moderate): Secondary | ICD-10-CM | POA: Diagnosis not present

## 2016-12-19 DIAGNOSIS — Z452 Encounter for adjustment and management of vascular access device: Secondary | ICD-10-CM | POA: Diagnosis not present

## 2016-12-19 DIAGNOSIS — I129 Hypertensive chronic kidney disease with stage 1 through stage 4 chronic kidney disease, or unspecified chronic kidney disease: Secondary | ICD-10-CM | POA: Diagnosis not present

## 2016-12-19 DIAGNOSIS — E1122 Type 2 diabetes mellitus with diabetic chronic kidney disease: Secondary | ICD-10-CM | POA: Diagnosis not present

## 2016-12-23 DIAGNOSIS — E1122 Type 2 diabetes mellitus with diabetic chronic kidney disease: Secondary | ICD-10-CM | POA: Diagnosis not present

## 2016-12-23 DIAGNOSIS — Z23 Encounter for immunization: Secondary | ICD-10-CM | POA: Diagnosis not present

## 2016-12-23 DIAGNOSIS — Z0000 Encounter for general adult medical examination without abnormal findings: Secondary | ICD-10-CM | POA: Diagnosis not present

## 2016-12-23 DIAGNOSIS — I10 Essential (primary) hypertension: Secondary | ICD-10-CM | POA: Diagnosis not present

## 2016-12-23 DIAGNOSIS — Z794 Long term (current) use of insulin: Secondary | ICD-10-CM | POA: Diagnosis not present

## 2016-12-23 DIAGNOSIS — N183 Chronic kidney disease, stage 3 (moderate): Secondary | ICD-10-CM | POA: Diagnosis not present

## 2016-12-23 DIAGNOSIS — E7800 Pure hypercholesterolemia, unspecified: Secondary | ICD-10-CM | POA: Diagnosis not present

## 2016-12-30 ENCOUNTER — Other Ambulatory Visit: Payer: Self-pay | Admitting: Internal Medicine

## 2016-12-30 DIAGNOSIS — Z1231 Encounter for screening mammogram for malignant neoplasm of breast: Secondary | ICD-10-CM

## 2017-01-16 DIAGNOSIS — H9190 Unspecified hearing loss, unspecified ear: Secondary | ICD-10-CM | POA: Diagnosis not present

## 2017-01-16 DIAGNOSIS — H6123 Impacted cerumen, bilateral: Secondary | ICD-10-CM | POA: Diagnosis not present

## 2017-01-27 ENCOUNTER — Ambulatory Visit
Admission: RE | Admit: 2017-01-27 | Discharge: 2017-01-27 | Disposition: A | Discharge status: Home / Self Care | Payer: Medicare HMO | Source: Ambulatory Visit | Attending: Internal Medicine | Admitting: Internal Medicine

## 2017-01-27 DIAGNOSIS — Z1231 Encounter for screening mammogram for malignant neoplasm of breast: Secondary | ICD-10-CM | POA: Diagnosis not present

## 2017-03-06 DIAGNOSIS — Z23 Encounter for immunization: Secondary | ICD-10-CM | POA: Diagnosis not present

## 2017-03-27 DIAGNOSIS — E1122 Type 2 diabetes mellitus with diabetic chronic kidney disease: Secondary | ICD-10-CM | POA: Diagnosis not present

## 2017-03-27 DIAGNOSIS — N183 Chronic kidney disease, stage 3 (moderate): Secondary | ICD-10-CM | POA: Diagnosis not present

## 2017-03-27 DIAGNOSIS — Z794 Long term (current) use of insulin: Secondary | ICD-10-CM | POA: Diagnosis not present

## 2017-04-07 DIAGNOSIS — Z794 Long term (current) use of insulin: Secondary | ICD-10-CM | POA: Diagnosis not present

## 2017-04-07 DIAGNOSIS — N183 Chronic kidney disease, stage 3 (moderate): Secondary | ICD-10-CM | POA: Diagnosis not present

## 2017-04-07 DIAGNOSIS — E1122 Type 2 diabetes mellitus with diabetic chronic kidney disease: Secondary | ICD-10-CM | POA: Diagnosis not present

## 2017-04-07 DIAGNOSIS — E039 Hypothyroidism, unspecified: Secondary | ICD-10-CM | POA: Diagnosis not present

## 2017-04-07 DIAGNOSIS — I10 Essential (primary) hypertension: Secondary | ICD-10-CM | POA: Diagnosis not present

## 2017-04-07 DIAGNOSIS — E7800 Pure hypercholesterolemia, unspecified: Secondary | ICD-10-CM | POA: Diagnosis not present

## 2017-06-30 DIAGNOSIS — N183 Chronic kidney disease, stage 3 (moderate): Secondary | ICD-10-CM | POA: Diagnosis not present

## 2017-06-30 DIAGNOSIS — Z794 Long term (current) use of insulin: Secondary | ICD-10-CM | POA: Diagnosis not present

## 2017-06-30 DIAGNOSIS — E1122 Type 2 diabetes mellitus with diabetic chronic kidney disease: Secondary | ICD-10-CM | POA: Diagnosis not present

## 2017-06-30 DIAGNOSIS — I10 Essential (primary) hypertension: Secondary | ICD-10-CM | POA: Diagnosis not present

## 2017-07-01 DIAGNOSIS — R300 Dysuria: Secondary | ICD-10-CM | POA: Diagnosis not present

## 2017-07-07 DIAGNOSIS — N390 Urinary tract infection, site not specified: Secondary | ICD-10-CM | POA: Diagnosis not present

## 2017-07-07 DIAGNOSIS — E1122 Type 2 diabetes mellitus with diabetic chronic kidney disease: Secondary | ICD-10-CM | POA: Diagnosis not present

## 2017-07-07 DIAGNOSIS — B9620 Unspecified Escherichia coli [E. coli] as the cause of diseases classified elsewhere: Secondary | ICD-10-CM | POA: Diagnosis not present

## 2017-07-07 DIAGNOSIS — E039 Hypothyroidism, unspecified: Secondary | ICD-10-CM | POA: Diagnosis not present

## 2017-07-07 DIAGNOSIS — Z794 Long term (current) use of insulin: Secondary | ICD-10-CM | POA: Diagnosis not present

## 2017-07-07 DIAGNOSIS — E7800 Pure hypercholesterolemia, unspecified: Secondary | ICD-10-CM | POA: Diagnosis not present

## 2017-07-07 DIAGNOSIS — I10 Essential (primary) hypertension: Secondary | ICD-10-CM | POA: Diagnosis not present

## 2017-07-07 DIAGNOSIS — N183 Chronic kidney disease, stage 3 (moderate): Secondary | ICD-10-CM | POA: Diagnosis not present

## 2017-09-29 DIAGNOSIS — N183 Chronic kidney disease, stage 3 (moderate): Secondary | ICD-10-CM | POA: Diagnosis not present

## 2017-09-29 DIAGNOSIS — Z794 Long term (current) use of insulin: Secondary | ICD-10-CM | POA: Diagnosis not present

## 2017-09-29 DIAGNOSIS — E1122 Type 2 diabetes mellitus with diabetic chronic kidney disease: Secondary | ICD-10-CM | POA: Diagnosis not present

## 2017-10-06 DIAGNOSIS — N183 Chronic kidney disease, stage 3 (moderate): Secondary | ICD-10-CM | POA: Diagnosis not present

## 2017-10-06 DIAGNOSIS — Z794 Long term (current) use of insulin: Secondary | ICD-10-CM | POA: Diagnosis not present

## 2017-10-06 DIAGNOSIS — Z1211 Encounter for screening for malignant neoplasm of colon: Secondary | ICD-10-CM | POA: Diagnosis not present

## 2017-10-06 DIAGNOSIS — M8589 Other specified disorders of bone density and structure, multiple sites: Secondary | ICD-10-CM | POA: Diagnosis not present

## 2017-10-06 DIAGNOSIS — E1122 Type 2 diabetes mellitus with diabetic chronic kidney disease: Secondary | ICD-10-CM | POA: Diagnosis not present

## 2017-10-08 DIAGNOSIS — M8589 Other specified disorders of bone density and structure, multiple sites: Secondary | ICD-10-CM | POA: Insufficient documentation

## 2017-12-01 DIAGNOSIS — M8588 Other specified disorders of bone density and structure, other site: Secondary | ICD-10-CM | POA: Diagnosis not present

## 2017-12-22 ENCOUNTER — Other Ambulatory Visit: Payer: Self-pay | Admitting: Internal Medicine

## 2017-12-22 DIAGNOSIS — Z1231 Encounter for screening mammogram for malignant neoplasm of breast: Secondary | ICD-10-CM

## 2017-12-28 DIAGNOSIS — W1830XA Fall on same level, unspecified, initial encounter: Secondary | ICD-10-CM | POA: Diagnosis not present

## 2017-12-28 DIAGNOSIS — Z794 Long term (current) use of insulin: Secondary | ICD-10-CM | POA: Diagnosis not present

## 2017-12-28 DIAGNOSIS — R0781 Pleurodynia: Secondary | ICD-10-CM | POA: Diagnosis not present

## 2017-12-28 DIAGNOSIS — E1122 Type 2 diabetes mellitus with diabetic chronic kidney disease: Secondary | ICD-10-CM | POA: Diagnosis not present

## 2017-12-28 DIAGNOSIS — N183 Chronic kidney disease, stage 3 (moderate): Secondary | ICD-10-CM | POA: Diagnosis not present

## 2017-12-28 DIAGNOSIS — S80211A Abrasion, right knee, initial encounter: Secondary | ICD-10-CM | POA: Diagnosis not present

## 2017-12-28 DIAGNOSIS — S299XXA Unspecified injury of thorax, initial encounter: Secondary | ICD-10-CM | POA: Diagnosis not present

## 2017-12-29 ENCOUNTER — Emergency Department: Payer: Medicare HMO

## 2017-12-29 ENCOUNTER — Emergency Department
Admission: EM | Admit: 2017-12-29 | Discharge: 2017-12-29 | Disposition: A | Discharge status: Home / Self Care | Payer: Medicare HMO | Attending: Emergency Medicine | Admitting: Emergency Medicine

## 2017-12-29 ENCOUNTER — Encounter: Payer: Self-pay | Admitting: Emergency Medicine

## 2017-12-29 ENCOUNTER — Other Ambulatory Visit: Payer: Self-pay

## 2017-12-29 DIAGNOSIS — Y999 Unspecified external cause status: Secondary | ICD-10-CM | POA: Insufficient documentation

## 2017-12-29 DIAGNOSIS — N390 Urinary tract infection, site not specified: Secondary | ICD-10-CM | POA: Insufficient documentation

## 2017-12-29 DIAGNOSIS — N2889 Other specified disorders of kidney and ureter: Secondary | ICD-10-CM | POA: Diagnosis not present

## 2017-12-29 DIAGNOSIS — Y939 Activity, unspecified: Secondary | ICD-10-CM | POA: Insufficient documentation

## 2017-12-29 DIAGNOSIS — S2241XA Multiple fractures of ribs, right side, initial encounter for closed fracture: Secondary | ICD-10-CM

## 2017-12-29 DIAGNOSIS — S3991XA Unspecified injury of abdomen, initial encounter: Secondary | ICD-10-CM | POA: Diagnosis not present

## 2017-12-29 DIAGNOSIS — W19XXXA Unspecified fall, initial encounter: Secondary | ICD-10-CM | POA: Diagnosis not present

## 2017-12-29 DIAGNOSIS — Y929 Unspecified place or not applicable: Secondary | ICD-10-CM | POA: Diagnosis not present

## 2017-12-29 DIAGNOSIS — S2241XB Multiple fractures of ribs, right side, initial encounter for open fracture: Secondary | ICD-10-CM | POA: Insufficient documentation

## 2017-12-29 DIAGNOSIS — R1031 Right lower quadrant pain: Secondary | ICD-10-CM | POA: Diagnosis not present

## 2017-12-29 LAB — COMPREHENSIVE METABOLIC PANEL
ALBUMIN: 4.4 g/dL (ref 3.5–5.0)
ALT: 41 U/L (ref 0–44)
ANION GAP: 10 (ref 5–15)
AST: 26 U/L (ref 15–41)
Alkaline Phosphatase: 120 U/L (ref 38–126)
BUN: 36 mg/dL — ABNORMAL HIGH (ref 8–23)
CO2: 23 mmol/L (ref 22–32)
Calcium: 9.6 mg/dL (ref 8.9–10.3)
Chloride: 103 mmol/L (ref 98–111)
Creatinine, Ser: 1.65 mg/dL — ABNORMAL HIGH (ref 0.44–1.00)
GFR calc Af Amer: 34 mL/min — ABNORMAL LOW (ref >60)
GFR calc non Af Amer: 30 mL/min — ABNORMAL LOW (ref >60)
GLUCOSE: 295 mg/dL — AB (ref 70–99)
POTASSIUM: 5.5 mmol/L — AB (ref 3.5–5.1)
SODIUM: 136 mmol/L (ref 135–145)
TOTAL PROTEIN: 8 g/dL (ref 6.5–8.1)
Total Bilirubin: 0.9 mg/dL (ref 0.3–1.2)

## 2017-12-29 LAB — URINALYSIS, COMPLETE (UACMP) WITH MICROSCOPIC
BILIRUBIN URINE: NEGATIVE
Glucose, UA: >=500 mg/dL — AB
HGB URINE DIPSTICK: NEGATIVE
Ketones, ur: NEGATIVE mg/dL
NITRITE: POSITIVE — AB
Protein, ur: NEGATIVE mg/dL
Specific Gravity, Urine: 1.018 (ref 1.005–1.030)
pH: 5 (ref 5.0–8.0)

## 2017-12-29 LAB — CBC
HEMATOCRIT: 38.8 % (ref 35.0–47.0)
HEMOGLOBIN: 12.6 g/dL (ref 12.0–16.0)
MCH: 30.5 pg (ref 26.0–34.0)
MCHC: 32.6 g/dL (ref 32.0–36.0)
MCV: 93.4 fL (ref 80.0–100.0)
Platelets: 192 10*3/uL (ref 150–440)
RBC: 4.15 MIL/uL (ref 3.80–5.20)
RDW: 13.5 % (ref 11.5–14.5)
WBC: 13.8 10*3/uL — ABNORMAL HIGH (ref 3.6–11.0)

## 2017-12-29 LAB — LIPASE, BLOOD: LIPASE: 24 U/L (ref 11–51)

## 2017-12-29 IMAGING — CT CT ABD-PELV W/ CM
2 of 5 series · 14 of 46 positions shown, 16 images · IV contrast (APPLIED)
Comparison: None available.

CLINICAL DATA: Initial evaluation for acute right lower quadrant
pain, recent fall.

EXAM:
CT ABDOMEN AND PELVIS WITH CONTRAST
TECHNIQUE: Multidetector CT imaging of the abdomen and pelvis was performed
using the standard protocol following bolus administration of
intravenous contrast.
CONTRAST:  75mL OMNIPAQUE IOHEXOL 300 MG/ML  SOLN

[Series 2: routine abd/pel with (person_name) · axial · 0.80mm/px · z∈[-1002,-542]mm · 11 of 102 slices shown, 13 images]
[im 5/102  soft-tissue]
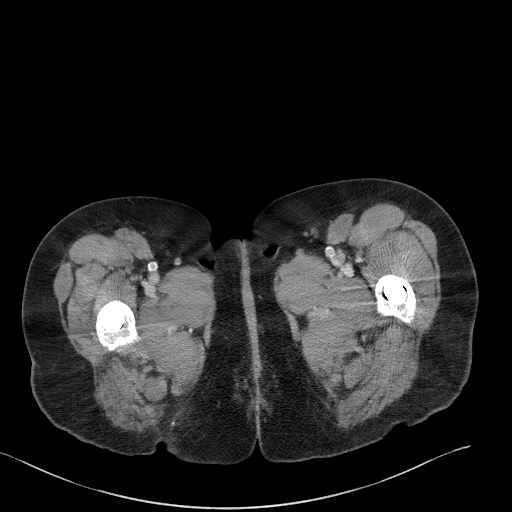
[im 5/102  bone]
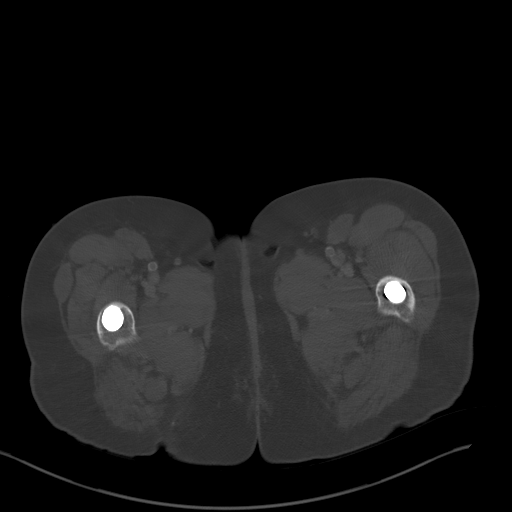
[im 15/102  soft-tissue]
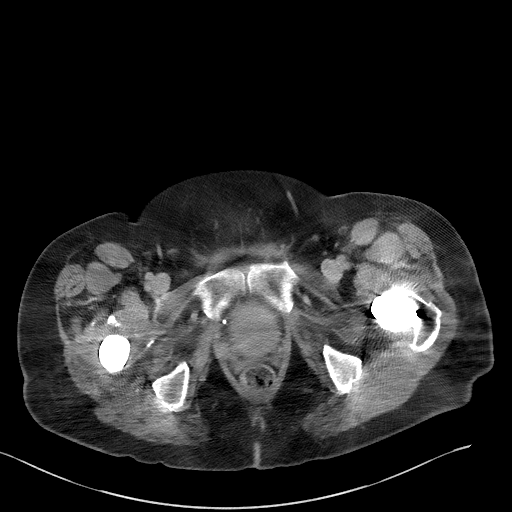
[im 25/102  soft-tissue]
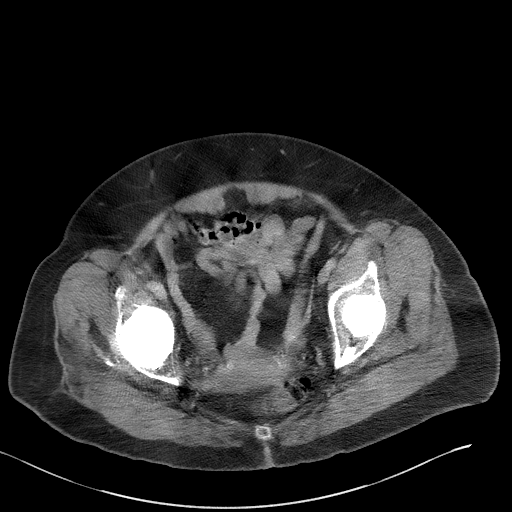
[im 34/102  soft-tissue]
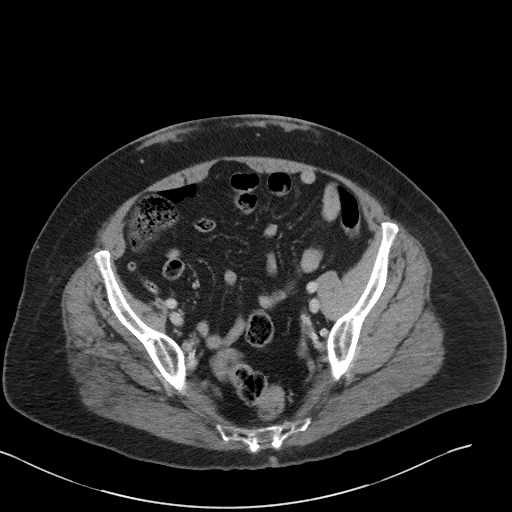
[im 44/102  soft-tissue]
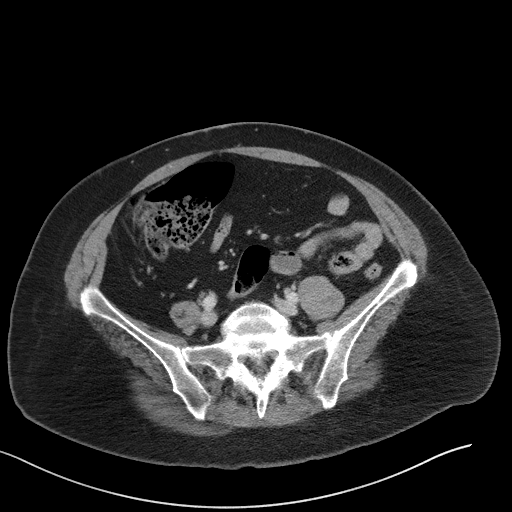
[im 53/102  soft-tissue]
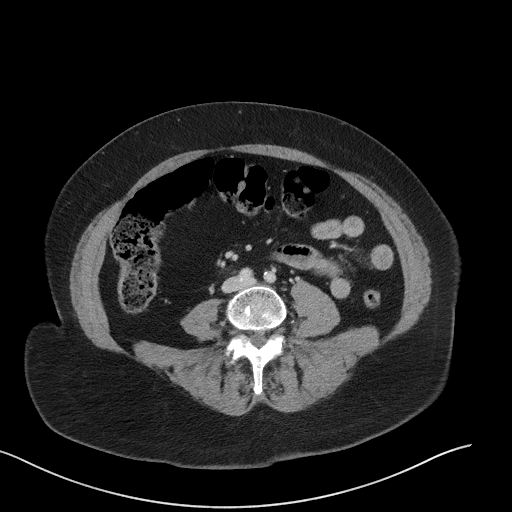
[im 58/102  soft-tissue]
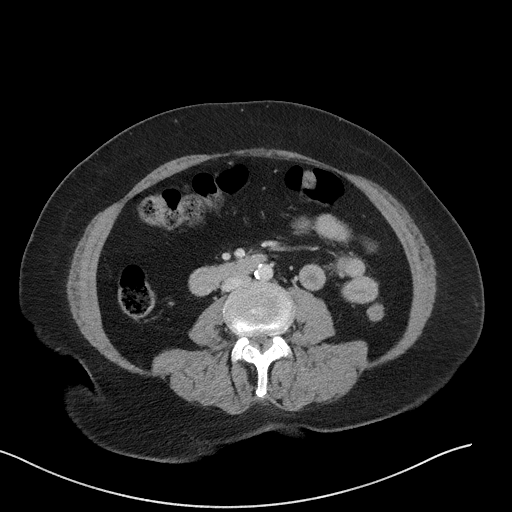
[im 68/102  soft-tissue]
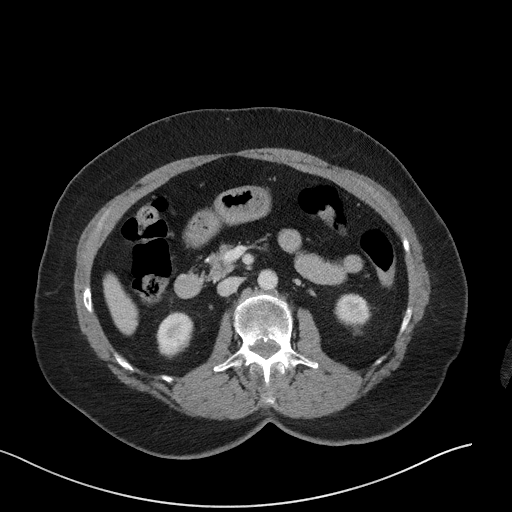
[im 77/102  soft-tissue]
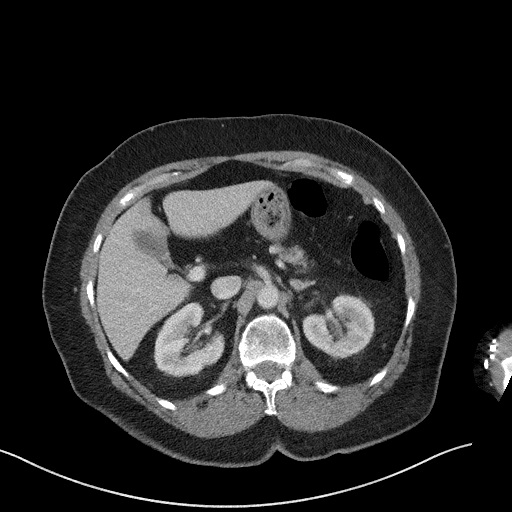
[im 77/102  bone]
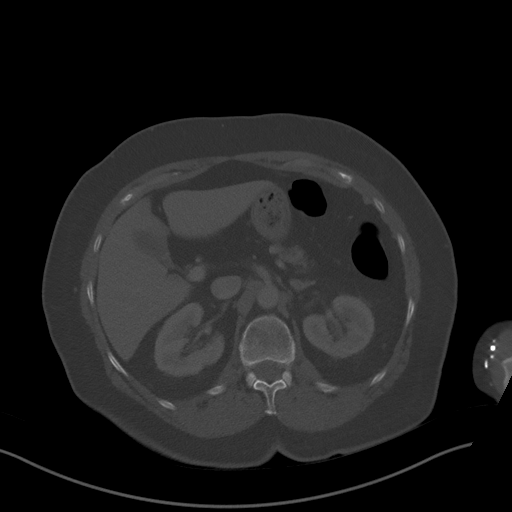
[im 87/102  soft-tissue]
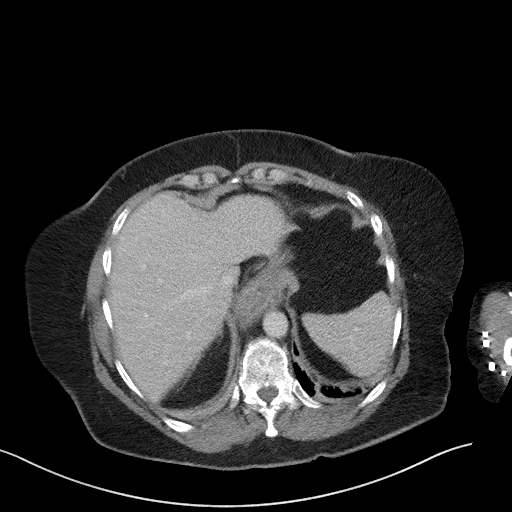
[im 97/102  soft-tissue]
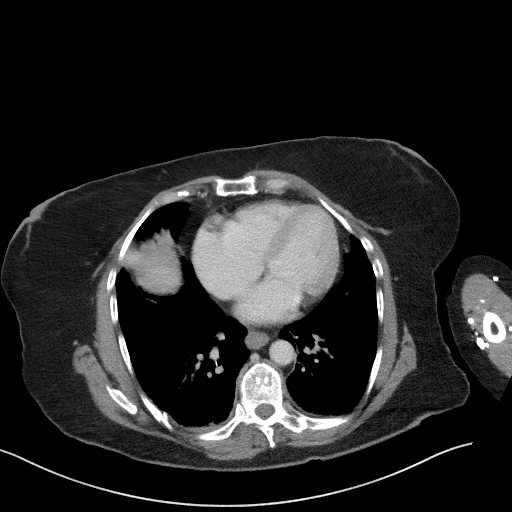

[Series 5: coronal st · coronal · 0.71mm/px · 3 of 95 slices shown]
[im 32/95  soft-tissue]
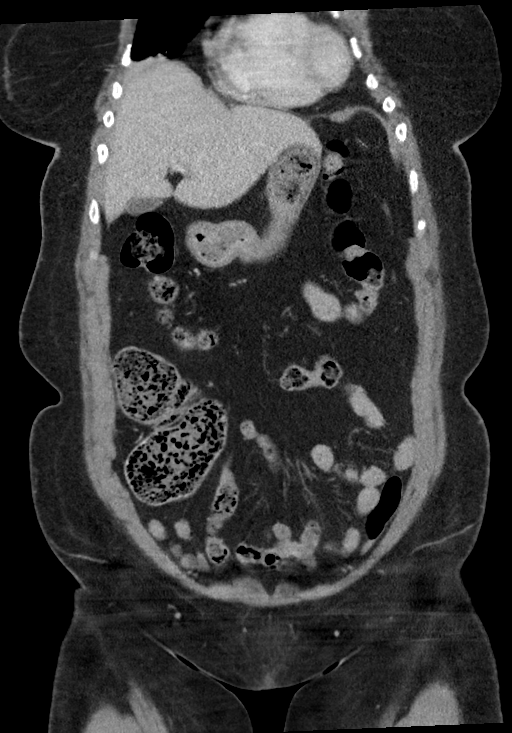
[im 42/95  soft-tissue]
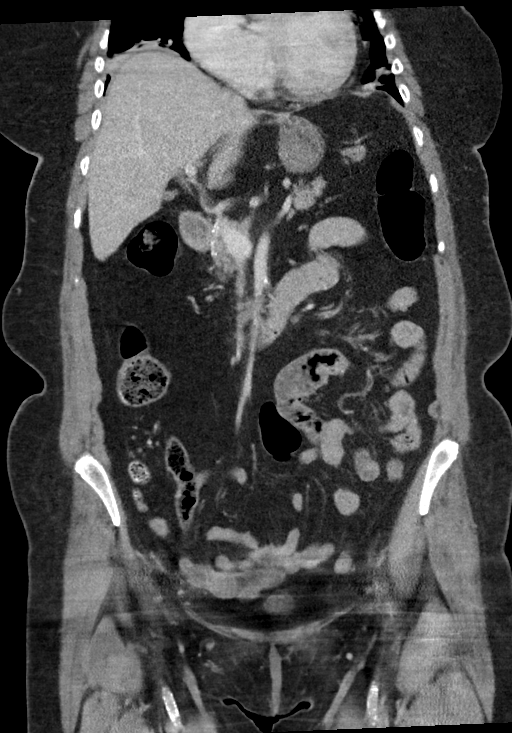
[im 53/95  soft-tissue]
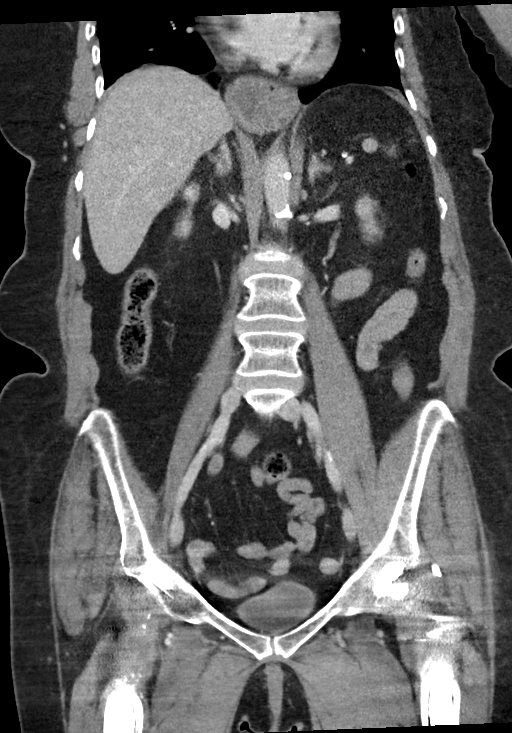

[14 of 46 positions shown; findings below may reference images not displayed]

FINDINGS: Lower chest: Small layering bilateral pleural effusions with
associated atelectasis, right greater than left. Effusion on the
right somewhat hyperdense in nature, likely reflecting hemothorax.
Mild cardiomegaly partially visualized.

Hepatobiliary: Liver demonstrates a normal contrast enhanced
appearance. Faint hyperdensity within the gallbladder lumen may
reflect stones and/or sludge. No imaging findings to suggest acute
cholecystitis. No biliary dilatation.

Pancreas: Pancreas demonstrates no acute abnormality. Mild fatty
infiltration of the pancreas noted.

Spleen: Spleen intact and within normal limits.

Adrenals/Urinary Tract: Adrenal glands are normal. Kidneys equal
size with symmetric enhancement. No nephrolithiasis or
hydronephrosis. 1 cm heterogeneous enhancing lesion at the lower
pole left kidney, indeterminate (series 2, image 33). Few additional
subcentimeter hypodensities too small the characterize. No
hydroureter. Partially distended bladder grossly unremarkable,
although evaluation limited by streak artifact from bilateral total
hip arthroplasties.

Stomach/Bowel: Moderate hiatal hernia. Stomach otherwise
unremarkable. No evidence for bowel obstruction or acute bowel
injury. Appendix normal. No acute inflammatory changes seen about
the bowels.

Vascular/Lymphatic: Normal intravascular enhancement seen throughout
the intra-abdominal aorta. Moderate aorto bi-iliac atherosclerotic
disease. No aneurysm. Mesenteric vessels patent proximally. No
adenopathy.

Reproductive: Uterus and ovaries grossly within normal limits,
although evaluation limited by streak artifact

Other: No free air or fluid. No mesenteric or retroperitoneal
hematoma.

Musculoskeletal: Bilateral total hip arthroplasties in place,
somewhat subacute in appearance on the right mild height loss at the
superior endplate of L1 is chronic in appearance. No acute vertebral
fracture. There are acute minimally displaced fractures of the right
posterior eighth and ninth ribs. Probable acute nondisplaced
fractures of the right eighth, ninth, and tenth transverse
processes. No other acute osseous abnormality. No discrete lytic or
blastic osseous lesions.

Mild soft tissue stranding within the subcutaneous fat of the
anterior abdomen noted, favored to be chronic in nature, and could
reflect scarring.
IMPRESSION: 1. Acute minimally displaced fractures of the right posterior eighth
and ninth ribs, with additional nondisplaced fractures of the right
eighth through tenth transverse processes.
2. Associated small layering right hemothorax with associated
atelectasis.
3. No other acute abnormality within the abdomen and pelvis.
4. 1 cm enhancing left renal mass, indeterminate, but could reflect
a small renal cell malignancy. Follow-up examination with renal mass
protocol CT and/or MRI recommended.
5. Moderate hiatal hernia.

## 2017-12-29 MED ORDER — CEPHALEXIN 500 MG PO CAPS: 500.0000 mg | ORAL_CAPSULE | Freq: Three times a day (TID) | ORAL | 0 refills | Status: DC

## 2017-12-29 MED ORDER — CEPHALEXIN 500 MG PO CAPS
1000.0000 mg | ORAL_CAPSULE | Freq: Once | ORAL | Status: AC
  Administered 2017-12-29: 1000 mg via ORAL
  Filled 2017-12-29: qty 2

## 2017-12-29 MED ORDER — IOHEXOL 300 MG/ML  SOLN
75.0000 mL | Freq: Once | INTRAMUSCULAR | Status: AC | PRN
  Administered 2017-12-29: 75 mL via INTRAVENOUS

## 2017-12-29 NOTE — ED Triage Notes (Signed)
Pt reports that she fell Sunday went to [facility] yesterday, they did xrays and it was all negative. She woke up this am with RLQ pain. She reports that it hurts when she moves but is she is laying still it does not hurt.

## 2017-12-29 NOTE — ED Notes (Signed)
Pt returned from CT at this time.  

## 2017-12-29 NOTE — Discharge Instructions (Addendum)
Please talk to your primary care doctor about getting dedicated imaging of your kidneys to evaluate the kidney mass seen today. As discussed please use the incentive spirometer. Please seek medical attention for any high fevers, chest pain, shortness of breath, change in behavior, persistent vomiting, bloody stool or any other new or concerning symptoms.

## 2017-12-29 NOTE — ED Provider Notes (Signed)
[facility] Emergency Department Provider Note  ____________________________________________   I have reviewed the triage vital signs and the nursing notes.   HISTORY  Chief Complaint Abdominal Pain   History limited by: Not Limited   HPI Mckenzie Wright is a 72 y.o. female who presents to the emergency department today because of concerns for abdominal pain.  Patient stated that abdominal pain started today.  Located in the lower abdomen and right lower quadrant.  Pain is worse with movement.  Patient has not noticed any change in urination or defecation.  Patient did have a recent fall.  States that she was seen for the fall and had some back pain.  This had improved since the fall.  She did not have any abdominal pain immediately after the fall.   Per medical record review patient has a history of recent visit for fall.   History reviewed. No pertinent past medical history.  There are no active problems to display for this patient.   History reviewed. No pertinent surgical history.  Prior to Admission medications   Not on File    Allergies Patient has no known allergies.  Family History  Problem Relation Age of Onset  . Breast cancer Neg Hx     Social History Social History   Tobacco Use  . Smoking status: Never Smoker  Substance Use Topics  . Alcohol use: Not Currently  . Drug use: Never    Review of Systems Constitutional: No fever/chills Eyes: No visual changes. ENT: No sore throat. Cardiovascular: Denies chest pain. Respiratory: Denies shortness of breath. Gastrointestinal: Positive for lower abdominal pain.  Genitourinary: Negative for dysuria. Musculoskeletal: Negative for back pain. Skin: Negative for rash. Neurological: Negative for headaches, focal weakness or numbness.  ____________________________________________   PHYSICAL EXAM:  VITAL SIGNS: ED Triage Vitals  Enc Vitals Group     BP 12/29/17 1853 (!) 152/64      Pulse Rate 12/29/17 1853 78     Resp 12/29/17 1853 20     Temp 12/29/17 1853 98.6 F (37 C)     Temp Source 12/29/17 1853 Oral     SpO2 12/29/17 1853 96 %     Weight 12/29/17 1855 170 lb (77.1 kg)     Height 12/29/17 1855 5\' 5"  (1.651 m)     Head Circumference --      Peak Flow --      Pain Score 12/29/17 1854 7   Constitutional: Alert and oriented.  Eyes: Conjunctivae are normal.  ENT      Head: Normocephalic and atraumatic.      Nose: No congestion/rhinnorhea.      Mouth/Throat: Mucous membranes are moist.      Neck: No stridor. Hematological/Lymphatic/Immunilogical: No cervical lymphadenopathy. Cardiovascular: Normal rate, regular rhythm.  No murmurs, rubs, or gallops.  Respiratory: Normal respiratory effort without tachypnea nor retractions. Breath sounds are clear and equal bilaterally. No wheezes/rales/rhonchi. Gastrointestinal: Soft and tender in the right lower quadrant and suprapubic region.  Genitourinary: Deferred Musculoskeletal: Normal range of motion in all extremities. No lower extremity edema. Neurologic:  Normal speech and language. No gross focal neurologic deficits are appreciated.  Skin:  Skin is warm, dry and intact. No rash noted. Psychiatric: Mood and affect are normal. Speech and behavior are normal. Patient exhibits appropriate insight and judgment.  ____________________________________________    LABS (pertinent positives/negatives)  Lipase 24 CBC wbc 13.8, hgb 112.6, plt 192 CMP na 136, k 5.5, glu 295, cr 1.65 UA cloudy, positive  nitrite, large leukocytes, >50 wbcs, wbc present ____________________________________________   EKG  None  ____________________________________________    RADIOLOGY  CT abd/pel Right rib fractures, transverse process fractures, small hemothorax. Renal mass on the left  kidney   ____________________________________________   PROCEDURES  Procedures  ____________________________________________   INITIAL IMPRESSION / ASSESSMENT AND PLAN / ED COURSE  Pertinent labs & imaging results that were available during my care of the patient were reviewed by me and considered in my medical decision making (see chart for details).   Patient presented to the emergency department today because of concerns for right lower quadrant pain.  On exam she has some tenderness over that area and suprapubic area.  She did have a mild leukocytosis on blood work.  Given the tenderness location of pain a leukocytosis CT scan was performed to evaluate for appendicitis.  CT not consistent with appendicitis however did show some rib fractures.  This point the pain from her rib fractures are well controlled.  Will plan on giving her incentive spirometer.  Work-up is also consistent with urinary tract infection.  Will discharge with antibiotics.  ____________________________________________   FINAL CLINICAL IMPRESSION(S) / ED DIAGNOSES  Final diagnoses:  Lower urinary tract infectious disease  Closed fracture of multiple ribs of right side, initial encounter  Left renal mass     Note: This dictation was prepared with Dragon dictation. Any transcriptional errors that result from this process are unintentional     Nance Pear, MD 12/30/17 1558

## 2018-01-01 LAB — URINE CULTURE

## 2018-01-04 DIAGNOSIS — Z794 Long term (current) use of insulin: Secondary | ICD-10-CM | POA: Diagnosis not present

## 2018-01-04 DIAGNOSIS — E1122 Type 2 diabetes mellitus with diabetic chronic kidney disease: Secondary | ICD-10-CM | POA: Diagnosis not present

## 2018-01-04 DIAGNOSIS — R300 Dysuria: Secondary | ICD-10-CM | POA: Diagnosis not present

## 2018-01-04 DIAGNOSIS — N183 Chronic kidney disease, stage 3 (moderate): Secondary | ICD-10-CM | POA: Diagnosis not present

## 2018-01-07 DIAGNOSIS — Z794 Long term (current) use of insulin: Secondary | ICD-10-CM | POA: Insufficient documentation

## 2018-01-07 DIAGNOSIS — N184 Chronic kidney disease, stage 4 (severe): Secondary | ICD-10-CM | POA: Diagnosis not present

## 2018-01-07 DIAGNOSIS — Z0000 Encounter for general adult medical examination without abnormal findings: Secondary | ICD-10-CM | POA: Diagnosis not present

## 2018-01-07 DIAGNOSIS — Z1239 Encounter for other screening for malignant neoplasm of breast: Secondary | ICD-10-CM | POA: Diagnosis not present

## 2018-01-07 DIAGNOSIS — Z23 Encounter for immunization: Secondary | ICD-10-CM | POA: Diagnosis not present

## 2018-01-07 DIAGNOSIS — E1122 Type 2 diabetes mellitus with diabetic chronic kidney disease: Secondary | ICD-10-CM | POA: Diagnosis not present

## 2018-01-07 DIAGNOSIS — N2889 Other specified disorders of kidney and ureter: Secondary | ICD-10-CM | POA: Diagnosis not present

## 2018-01-07 DIAGNOSIS — N183 Chronic kidney disease, stage 3 (moderate): Secondary | ICD-10-CM | POA: Diagnosis not present

## 2018-01-13 ENCOUNTER — Other Ambulatory Visit: Payer: Self-pay | Admitting: Internal Medicine

## 2018-01-13 DIAGNOSIS — R100 Acute abdomen: Secondary | ICD-10-CM

## 2018-01-13 DIAGNOSIS — N2889 Other specified disorders of kidney and ureter: Secondary | ICD-10-CM

## 2018-01-18 ENCOUNTER — Ambulatory Visit
Admission: RE | Admit: 2018-01-18 | Discharge: 2018-01-18 | Disposition: A | Discharge status: Home / Self Care | Payer: Medicare HMO | Source: Ambulatory Visit | Attending: Internal Medicine | Admitting: Internal Medicine

## 2018-01-18 DIAGNOSIS — R100 Acute abdomen: Secondary | ICD-10-CM | POA: Insufficient documentation

## 2018-01-18 DIAGNOSIS — N2889 Other specified disorders of kidney and ureter: Secondary | ICD-10-CM | POA: Insufficient documentation

## 2018-01-18 LAB — POCT I-STAT CREATININE: Creatinine, Ser: 1.7 mg/dL — ABNORMAL HIGH (ref 0.44–1.00)

## 2018-02-09 ENCOUNTER — Ambulatory Visit
Admission: RE | Admit: 2018-02-09 | Discharge: 2018-02-09 | Disposition: A | Discharge status: Home / Self Care | Payer: Medicare HMO | Source: Ambulatory Visit | Attending: Internal Medicine | Admitting: Internal Medicine

## 2018-02-09 DIAGNOSIS — Z1231 Encounter for screening mammogram for malignant neoplasm of breast: Secondary | ICD-10-CM | POA: Diagnosis not present

## 2018-03-16 DIAGNOSIS — N2889 Other specified disorders of kidney and ureter: Secondary | ICD-10-CM | POA: Diagnosis not present

## 2018-03-16 DIAGNOSIS — E1129 Type 2 diabetes mellitus with other diabetic kidney complication: Secondary | ICD-10-CM | POA: Diagnosis not present

## 2018-03-16 DIAGNOSIS — N184 Chronic kidney disease, stage 4 (severe): Secondary | ICD-10-CM | POA: Diagnosis not present

## 2018-03-16 DIAGNOSIS — R609 Edema, unspecified: Secondary | ICD-10-CM | POA: Diagnosis not present

## 2018-03-16 DIAGNOSIS — R8290 Unspecified abnormal findings in urine: Secondary | ICD-10-CM | POA: Diagnosis not present

## 2018-03-16 DIAGNOSIS — I129 Hypertensive chronic kidney disease with stage 1 through stage 4 chronic kidney disease, or unspecified chronic kidney disease: Secondary | ICD-10-CM | POA: Diagnosis not present

## 2018-03-31 ENCOUNTER — Ambulatory Visit: Payer: Medicare HMO | Admitting: Urology

## 2018-03-31 ENCOUNTER — Other Ambulatory Visit: Payer: Self-pay

## 2018-03-31 ENCOUNTER — Encounter: Payer: Self-pay | Admitting: Urology

## 2018-03-31 VITALS — BP 146/69 | HR 82 | Ht 65.0 in | Wt 173.2 lb

## 2018-03-31 DIAGNOSIS — N2889 Other specified disorders of kidney and ureter: Secondary | ICD-10-CM

## 2018-03-31 LAB — URINALYSIS, COMPLETE
BILIRUBIN UA: NEGATIVE
Ketones, UA: NEGATIVE
Nitrite, UA: NEGATIVE
PH UA: 5.5 (ref 5.0–7.5)
PROTEIN UA: NEGATIVE
Specific Gravity, UA: 1.02 (ref 1.005–1.030)
UUROB: 0.2 mg/dL (ref 0.2–1.0)

## 2018-03-31 LAB — MICROSCOPIC EXAMINATION: Epithelial Cells (non renal): >10 /hpf — ABNORMAL HIGH (ref 0–10)

## 2018-03-31 NOTE — Progress Notes (Signed)
03/31/2018 9:27 AM   Edyth Gunnels 01-23-1946 [id number]  Referring provider: Glendon Axe, MD [place] [place], [place] [address]  CC: 1 cm left renal mass  HPI: I had the pleasure of seeing Ms. Overman in urology clinic today in consultation for a 1 cm enhancing left renal mass from Dr. Candiss Norse.  She is a 72 year old female with multiple hip replacements, diabetes, history of stroke, chronic kidney disease with baseline eGFR of approximately 30, recently seen in the emergency department with flank pain after a fall.  A CT scan with contrast was performed that showed a small 1 cm heterogenous enhancing lesion at the lower pole of the left kidney.  She denies any family history of kidney cancer, gross hematuria, or recurrent flank pain.  He follows with a nephrologist regarding her chronic kidney disease.  There are no aggravating or alleviating factors.  Duration is since July 2019.  Severity is mild.   PMH: Past Medical History:  Diagnosis Date  . Asthma   . Diabetes ([place])   . Hypertension   . Stroke [facility])     Surgical History: Past Surgical History:  Procedure Laterality Date  . TOTAL HIP ARTHROPLASTY  2000  . TOTAL HIP ARTHROPLASTY  2002  . TOTAL HIP ARTHROPLASTY  2017  . TOTAL HIP ARTHROPLASTY  2018    Allergies: No Known Allergies  Family History: Family History  Problem Relation Age of Onset  . Breast cancer Neg Hx     Social History:  reports that she has never smoked. She has never used smokeless tobacco. She reports that she drank alcohol. She reports that she does not use drugs.  ROS: Please see flowsheet from today's date for complete review of systems.  Physical Exam: BP (!) 146/69   Pulse 82   Ht _0  (1.651 m)   Wt 173 lb 3.2 oz (78.6 kg)   BMI 28.82 kg/m    Constitutional:  Alert and oriented, No acute distress. Cardiovascular: No clubbing, cyanosis, or edema. Respiratory: Normal respiratory effort, no  increased work of breathing. GI: Abdomen is soft, nontender, nondistended, no abdominal masses GU: No CVA tenderness Lymph: No cervical or inguinal lymphadenopathy. Skin: No rashes, bruises or suspicious lesions. Neurologic: Grossly intact, no focal deficits, moving all 4 extremities. Psychiatric: Normal mood and affect.  Laboratory Data: Creatinine 1.7, eGFR <30  Urinalysis today 11-30 WBCs, 0-2 RBCs, more than 10 epithelial cells, moderate bacteria, nitrite negative  Pertinent Imaging: I have personally reviewed the CT abdomen pelvis with contrast dated 12/29/2017.  There is a very small approximately 1 cm left-sided lower pole heterogenous and subtly enhancing renal lesion.  Assessment & Plan:   In summary, Ms. Lonia Skinner is a comorbid 72 year old female with stage III-IV kidney disease, incidentally found to have a small 1 cm left lower pole enhancing renal mass.  A solid renal mass raises the suspicion of possible primary renal malignancy.  We discussed this in detail and in regards to the spectrum of renal masses which includes cysts (pure cysts are considered benign), solid masses and everything in between. The risk of metastasis increases as the size of solid renal mass increases. In general, it is believed that the risk of metastasis for renal masses less than 3-4 cm is very small (less than ~5%) based mainly on large retrospective studies.  In some cases and especially in patients of older age and multiple comorbidities a surveillance approach may be appropriate. The treatment options for solid  renal masses include: surveillance, cryoablation (percutaneous and laparoscopic) in addition to partial and complete nephrectomy (each with option of laparoscopic, robotic and open depending on appropriateness). Furthermore, nephrectomy appears to be an independent risk factor for the development of chronic kidney disease suggesting that nephron sparing approaches should be implored whenever  feasible.  With her comorbidities and severe kidney disease, surgical approach with partial or complete nephrectomy would not be an option.  We discussed that active surveillance is a very safe approach to such a small renal mass.  Complicating her surveillance is that she has bilateral hip replacement and can likely not undergo an MRI.  She also has significant kidney disease, and we should avoid contrast CT if possible.  We discussed the most likely scenario is minimal or slow growth of this mass that can continue to be watched.  If this grew significantly, would likely pursue percutaneous ablation.  Follow-up in 6 months with renal ultrasound and chest x-ray, consider CT with contrast if lesion enlarging and renal function amenable  Billey Co, MD  [facility] [address], [place], [place] [address] [phone]

## 2018-04-09 DIAGNOSIS — N184 Chronic kidney disease, stage 4 (severe): Secondary | ICD-10-CM | POA: Diagnosis not present

## 2018-04-09 DIAGNOSIS — E1122 Type 2 diabetes mellitus with diabetic chronic kidney disease: Secondary | ICD-10-CM | POA: Diagnosis not present

## 2018-04-09 DIAGNOSIS — Z794 Long term (current) use of insulin: Secondary | ICD-10-CM | POA: Diagnosis not present

## 2018-04-16 ENCOUNTER — Other Ambulatory Visit: Payer: Self-pay | Admitting: Internal Medicine

## 2018-04-16 ENCOUNTER — Other Ambulatory Visit
Admission: RE | Admit: 2018-04-16 | Discharge: 2018-04-16 | Disposition: A | Discharge status: Home / Self Care | Payer: Medicare HMO | Source: Ambulatory Visit | Attending: Internal Medicine | Admitting: Internal Medicine

## 2018-04-16 ENCOUNTER — Ambulatory Visit
Admission: RE | Admit: 2018-04-16 | Discharge: 2018-04-16 | Disposition: A | Discharge status: Home / Self Care | Payer: Medicare HMO | Source: Ambulatory Visit | Attending: Internal Medicine | Admitting: Internal Medicine

## 2018-04-16 DIAGNOSIS — R0602 Shortness of breath: Secondary | ICD-10-CM | POA: Diagnosis not present

## 2018-04-16 DIAGNOSIS — N184 Chronic kidney disease, stage 4 (severe): Secondary | ICD-10-CM | POA: Diagnosis not present

## 2018-04-16 DIAGNOSIS — E1122 Type 2 diabetes mellitus with diabetic chronic kidney disease: Secondary | ICD-10-CM | POA: Diagnosis not present

## 2018-04-16 DIAGNOSIS — R7989 Other specified abnormal findings of blood chemistry: Secondary | ICD-10-CM | POA: Diagnosis not present

## 2018-04-16 DIAGNOSIS — E7800 Pure hypercholesterolemia, unspecified: Secondary | ICD-10-CM | POA: Diagnosis not present

## 2018-04-16 DIAGNOSIS — I700 Atherosclerosis of aorta: Secondary | ICD-10-CM | POA: Insufficient documentation

## 2018-04-16 DIAGNOSIS — R079 Chest pain, unspecified: Secondary | ICD-10-CM | POA: Diagnosis not present

## 2018-04-16 DIAGNOSIS — N3000 Acute cystitis without hematuria: Secondary | ICD-10-CM | POA: Diagnosis not present

## 2018-04-16 DIAGNOSIS — Z794 Long term (current) use of insulin: Secondary | ICD-10-CM | POA: Diagnosis not present

## 2018-04-16 DIAGNOSIS — E039 Hypothyroidism, unspecified: Secondary | ICD-10-CM | POA: Diagnosis not present

## 2018-04-16 DIAGNOSIS — I10 Essential (primary) hypertension: Secondary | ICD-10-CM | POA: Diagnosis not present

## 2018-04-16 LAB — FIBRIN DERIVATIVES D-DIMER (ARMC ONLY): Fibrin derivatives D-dimer (ARMC): 5189.57 ng/mL (FEU) — ABNORMAL HIGH (ref 0.00–499.00)

## 2018-04-16 IMAGING — NM NM PULMONARY VENT & PERF
2 series · 16 of 16 positions shown · non-contrast
Comparison: [DATE] chest radiograph

CLINICAL DATA: 72 y/o F; shortness of breath and chest pain.
Elevated D-dimer.

EXAM:
NUCLEAR MEDICINE VENTILATION - PERFUSION LUNG SCAN
TECHNIQUE: Ventilation images were obtained in multiple projections using
inhaled aerosol [TG] DTPA. Perfusion images were obtained in
multiple projections after intravenous injection of [TG].
RADIOPHARMACEUTICALS:  32.98 mCi of [TG] DTPA aerosol inhalation
and 4.97 mCi [TG] IV

[Series 1000: lung perfusion · 1.95mm/px · 4 acquisitions, 8 frames shown]
[im 1/4]
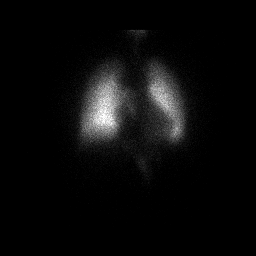
[im 1/4]
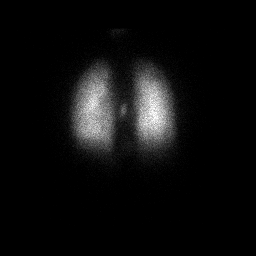
[im 2/4]
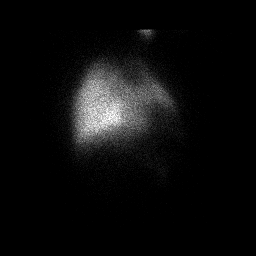
[im 2/4]
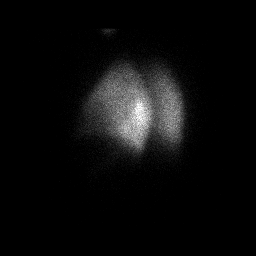
[im 3/4]
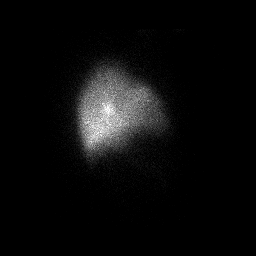
[im 3/4]
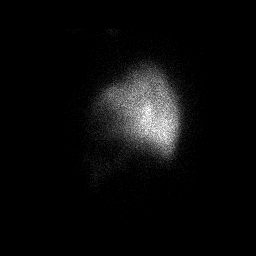
[im 4/4]
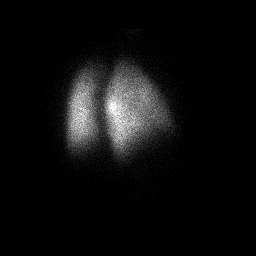
[im 4/4]
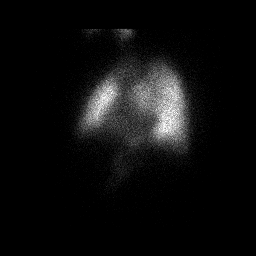

[Series 1000: lung ventilation · 3.90mm/px · 4 acquisitions, 8 frames shown]
[im 1/4]
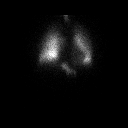
[im 1/4]
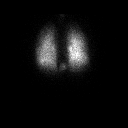
[im 2/4]
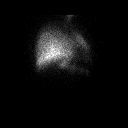
[im 2/4]
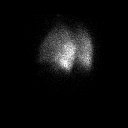
[im 3/4]
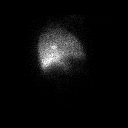
[im 3/4]
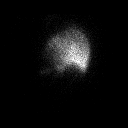
[im 4/4]
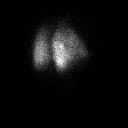
[im 4/4]
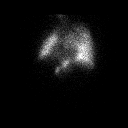

[16 of 16 positions shown; findings below may reference images not displayed]

FINDINGS: Ventilation: No focal ventilation defect.

Perfusion: No wedge shaped peripheral perfusion defects to suggest
acute pulmonary embolism.
IMPRESSION: No evidence of acute pulmonary embolus.

By: [email or web address] M.D.

## 2018-04-16 MED ORDER — TECHNETIUM TO 99M ALBUMIN AGGREGATED
4.0000 | Freq: Once | INTRAVENOUS | Status: AC | PRN
  Administered 2018-04-16: 4.97 via INTRAVENOUS

## 2018-04-16 MED ORDER — TECHNETIUM TC 99M DIETHYLENETRIAME-PENTAACETIC ACID
33.0000 | Freq: Once | INTRAVENOUS | Status: AC | PRN
  Administered 2018-04-16: 32.98 via RESPIRATORY_TRACT

## 2018-04-18 DIAGNOSIS — N2581 Secondary hyperparathyroidism of renal origin: Secondary | ICD-10-CM | POA: Insufficient documentation

## 2018-04-19 ENCOUNTER — Other Ambulatory Visit: Payer: Self-pay | Admitting: Internal Medicine

## 2018-04-19 DIAGNOSIS — R9389 Abnormal findings on diagnostic imaging of other specified body structures: Secondary | ICD-10-CM

## 2018-04-22 ENCOUNTER — Ambulatory Visit
Admission: RE | Admit: 2018-04-22 | Discharge: 2018-04-22 | Disposition: A | Discharge status: Home / Self Care | Payer: Medicare HMO | Source: Ambulatory Visit | Attending: Internal Medicine | Admitting: Internal Medicine

## 2018-04-22 DIAGNOSIS — R7989 Other specified abnormal findings of blood chemistry: Secondary | ICD-10-CM

## 2018-04-28 ENCOUNTER — Ambulatory Visit
Admission: RE | Admit: 2018-04-28 | Discharge: 2018-04-28 | Disposition: A | Discharge status: Home / Self Care | Payer: Medicare HMO | Source: Ambulatory Visit | Attending: Internal Medicine | Admitting: Internal Medicine

## 2018-05-07 ENCOUNTER — Ambulatory Visit: Payer: Medicare HMO

## 2018-05-07 ENCOUNTER — Ambulatory Visit
Admission: RE | Admit: 2018-05-07 | Discharge: 2018-05-07 | Disposition: A | Discharge status: Home / Self Care | Payer: Medicare HMO | Source: Ambulatory Visit | Attending: Internal Medicine | Admitting: Internal Medicine

## 2018-05-07 DIAGNOSIS — R9389 Abnormal findings on diagnostic imaging of other specified body structures: Secondary | ICD-10-CM

## 2018-05-07 DIAGNOSIS — K449 Diaphragmatic hernia without obstruction or gangrene: Secondary | ICD-10-CM | POA: Diagnosis not present

## 2018-05-07 IMAGING — CT CT CHEST W/O CM
2 of 4 series · 15 of 36 positions shown, 18 images · non-contrast
Comparison: [DATE] chest radiograph report.  Prior studies.

CLINICAL DATA: 72-year-old female with chest pain and possible
RIGHT mid lung opacity on recent chest radiograph.

EXAM:
CT CHEST WITHOUT CONTRAST
TECHNIQUE: Multidetector CT imaging of the chest was performed following the
standard protocol without IV contrast.

[Series 2: chest · axial · 0.63mm/px · z∈[-1322,-1050]mm · 12 of 162 slices shown, 15 images (1 of 2)]
[im 13/162  mediastinal]
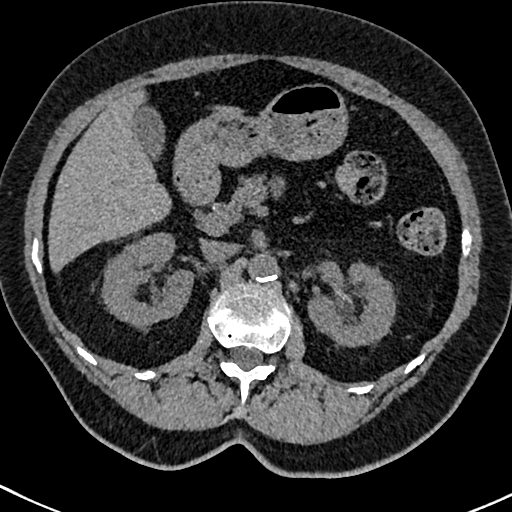
[im 13/162  lung]
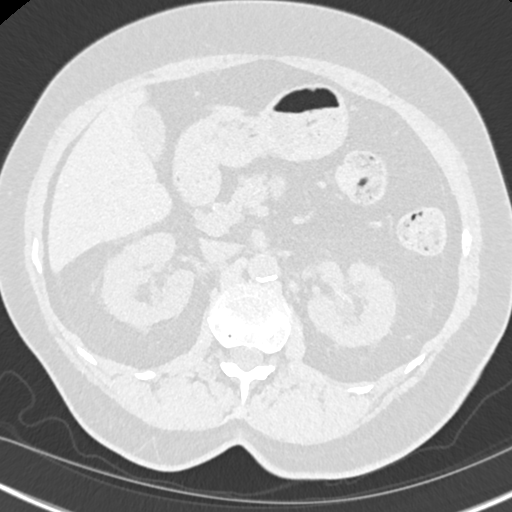
[im 25/162  lung]
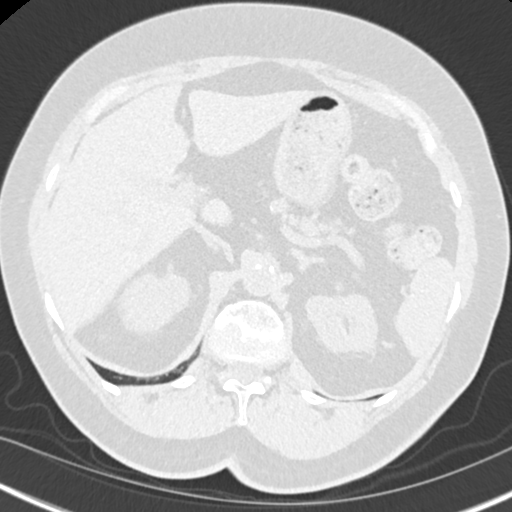
[im 38/162  lung]
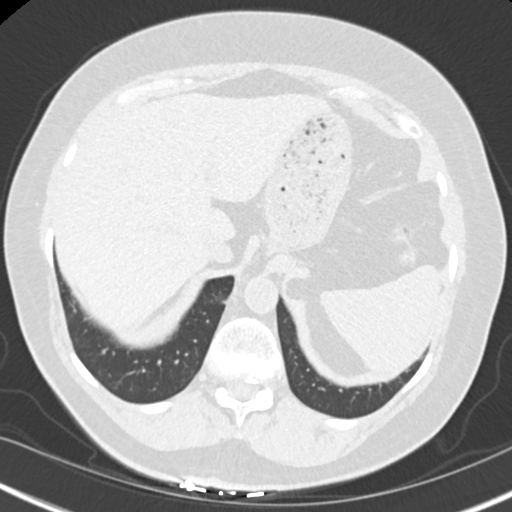
[im 50/162  lung]
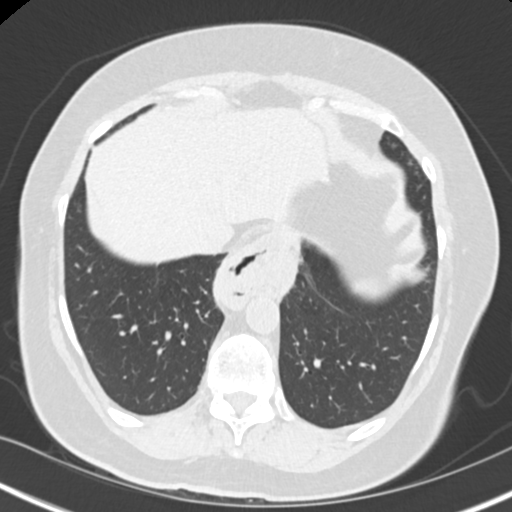
[im 62/162  mediastinal]
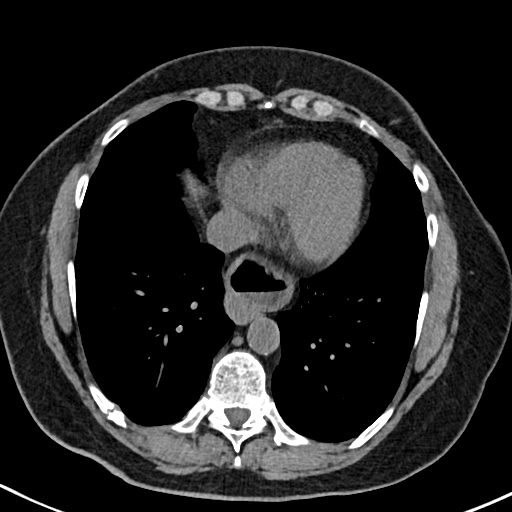
[im 62/162  lung]
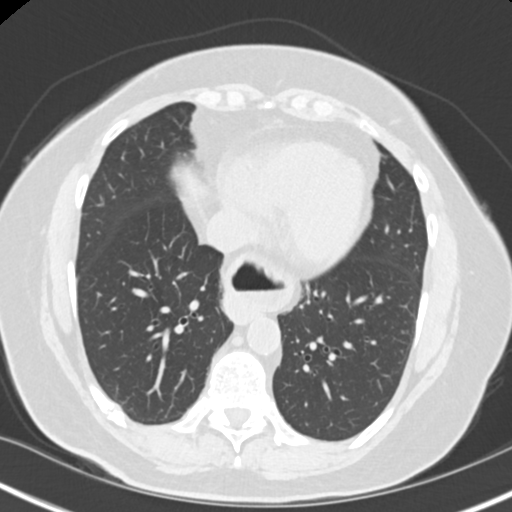
[im 75/162  lung]
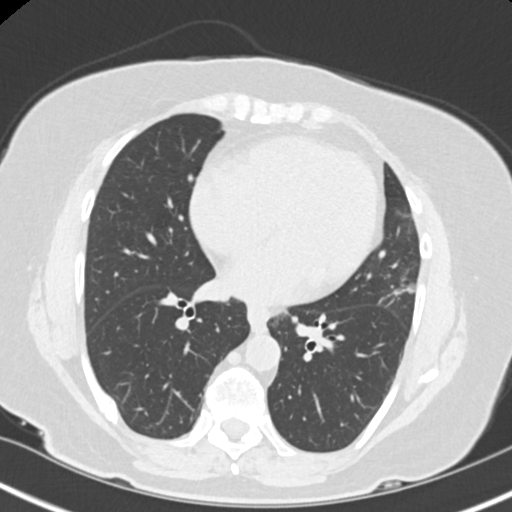
[im 87/162  lung]
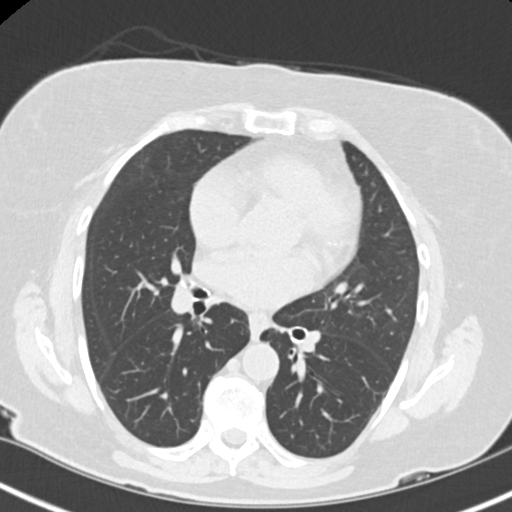
[im 100/162  lung]
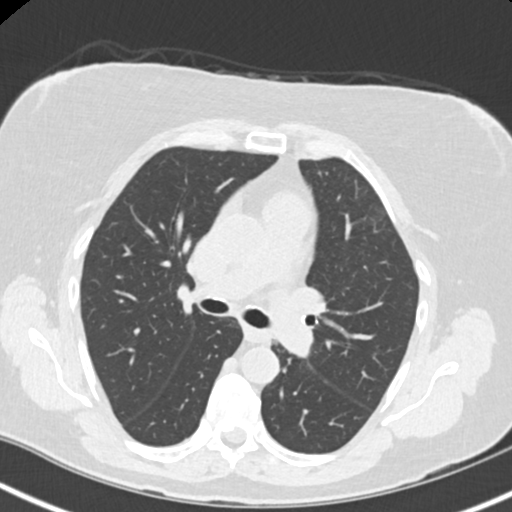
[im 112/162  mediastinal]
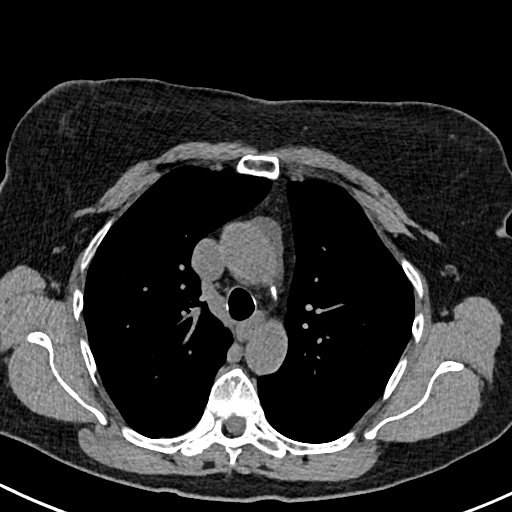
[im 112/162  lung]
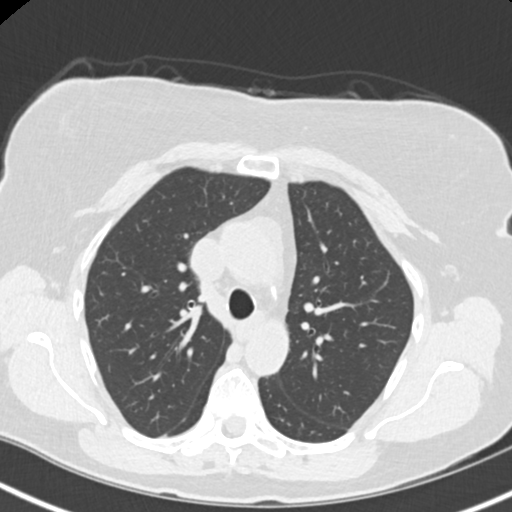
[im 124/162  lung]
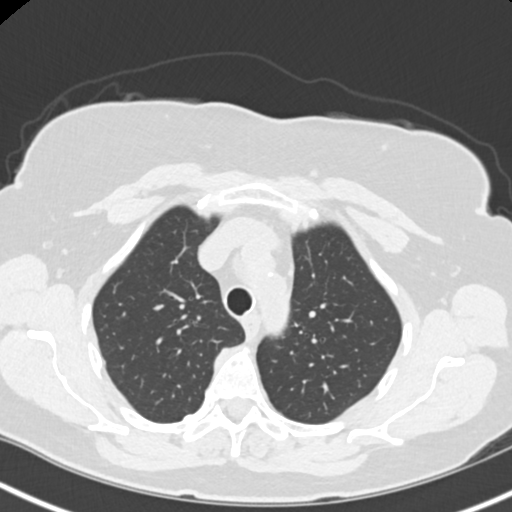
[im 137/162  lung]
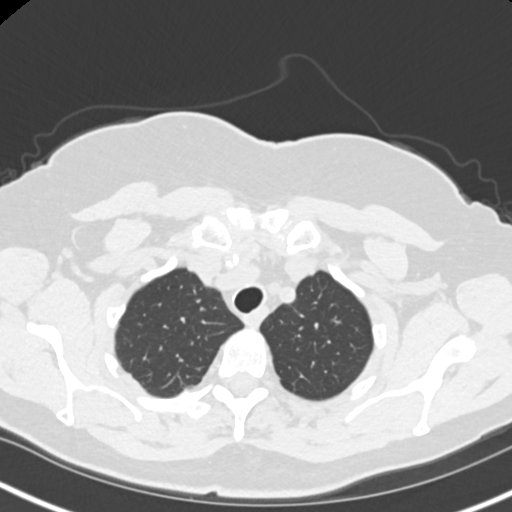
[im 149/162  lung]
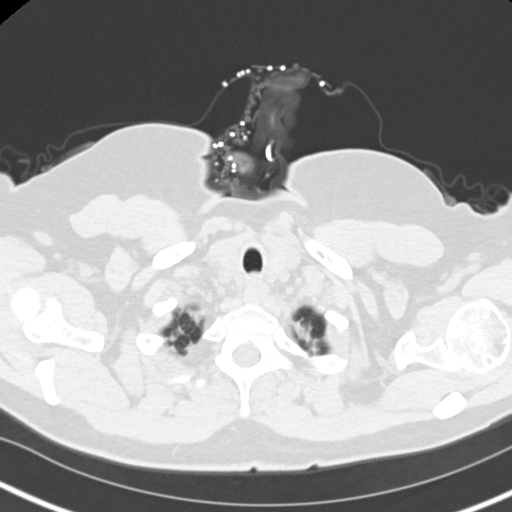

[Series 5: chest · coronal · 0.63mm/px · 3 of 160 slices shown (2 of 2)]
[im 32/160  lung]
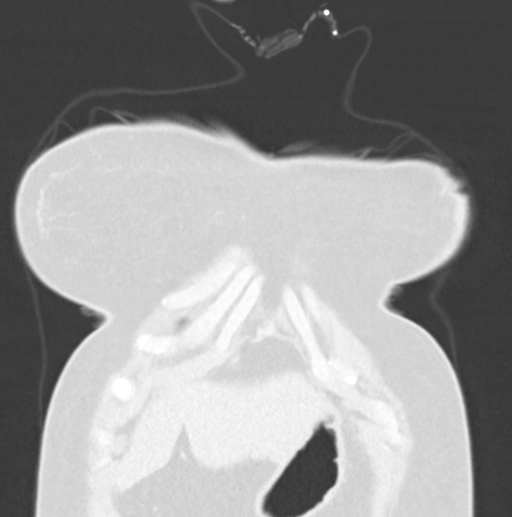
[im 64/160  lung]
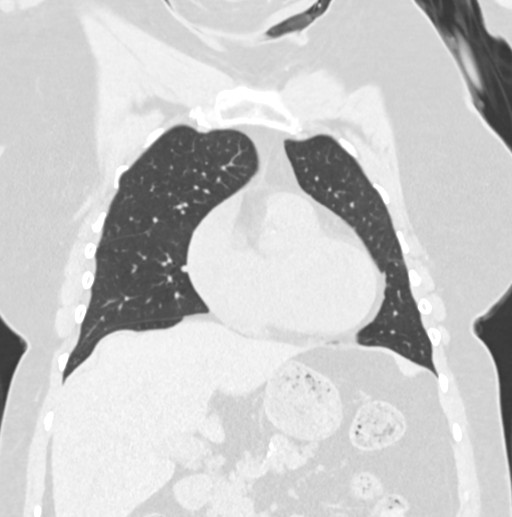
[im 96/160  lung]
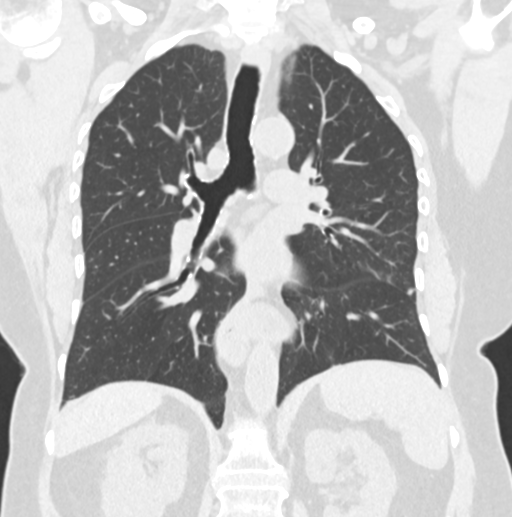

[15 of 36 positions shown; findings below may reference images not displayed]

FINDINGS: Cardiovascular: Moderate coronary artery atherosclerotic
calcifications noted. Normal heart size. Mild aortic atherosclerotic
calcifications noted without aneurysm. No pericardial effusion.

Mediastinum/Nodes: No mediastinal mass or enlarged lymph nodes. A
moderate to large hiatal hernia is present.

Lungs/Pleura: No suspicious pulmonary nodule or mass identified.
Minimal lingular and biapical scarring noted.

No airspace disease, consolidation, endobronchial/endotracheal
lesion, pleural effusion or pneumothorax identified.

Upper Abdomen: No acute abnormality.

Musculoskeletal: No acute or suspicious bony abnormalities noted.
Multiple remote RIGHT rib and thoracic spine transverse fractures
noted.
IMPRESSION: 1. No evidence of pulmonary nodule/mass.  No acute abnormality.
2. Moderate to large hiatal hernia.
3. Coronary artery and Aortic Atherosclerosis ([D9]-[D9]).

## 2018-05-10 ENCOUNTER — Ambulatory Visit: Admission: RE | Admit: 2018-05-10 | Payer: Medicare HMO | Source: Ambulatory Visit

## 2018-05-10 DIAGNOSIS — I2510 Atherosclerotic heart disease of native coronary artery without angina pectoris: Secondary | ICD-10-CM | POA: Insufficient documentation

## 2018-05-12 DIAGNOSIS — E1122 Type 2 diabetes mellitus with diabetic chronic kidney disease: Secondary | ICD-10-CM | POA: Diagnosis not present

## 2018-05-12 DIAGNOSIS — Z794 Long term (current) use of insulin: Secondary | ICD-10-CM | POA: Diagnosis not present

## 2018-05-12 DIAGNOSIS — N184 Chronic kidney disease, stage 4 (severe): Secondary | ICD-10-CM | POA: Diagnosis not present

## 2018-05-12 DIAGNOSIS — E215 Disorder of parathyroid gland, unspecified: Secondary | ICD-10-CM | POA: Diagnosis not present

## 2018-05-12 DIAGNOSIS — N2581 Secondary hyperparathyroidism of renal origin: Secondary | ICD-10-CM | POA: Diagnosis not present

## 2018-06-18 DIAGNOSIS — E1129 Type 2 diabetes mellitus with other diabetic kidney complication: Secondary | ICD-10-CM | POA: Diagnosis not present

## 2018-06-18 DIAGNOSIS — N183 Chronic kidney disease, stage 3 (moderate): Secondary | ICD-10-CM | POA: Diagnosis not present

## 2018-06-18 DIAGNOSIS — N2889 Other specified disorders of kidney and ureter: Secondary | ICD-10-CM | POA: Diagnosis not present

## 2018-06-18 DIAGNOSIS — I129 Hypertensive chronic kidney disease with stage 1 through stage 4 chronic kidney disease, or unspecified chronic kidney disease: Secondary | ICD-10-CM | POA: Diagnosis not present

## 2018-07-19 DIAGNOSIS — N2889 Other specified disorders of kidney and ureter: Secondary | ICD-10-CM | POA: Diagnosis not present

## 2018-07-19 DIAGNOSIS — N183 Chronic kidney disease, stage 3 (moderate): Secondary | ICD-10-CM | POA: Diagnosis not present

## 2018-07-19 DIAGNOSIS — E1129 Type 2 diabetes mellitus with other diabetic kidney complication: Secondary | ICD-10-CM | POA: Diagnosis not present

## 2018-07-19 DIAGNOSIS — I129 Hypertensive chronic kidney disease with stage 1 through stage 4 chronic kidney disease, or unspecified chronic kidney disease: Secondary | ICD-10-CM | POA: Diagnosis not present

## 2018-08-12 DIAGNOSIS — N2581 Secondary hyperparathyroidism of renal origin: Secondary | ICD-10-CM | POA: Diagnosis not present

## 2018-08-12 DIAGNOSIS — E1122 Type 2 diabetes mellitus with diabetic chronic kidney disease: Secondary | ICD-10-CM | POA: Diagnosis not present

## 2018-08-12 DIAGNOSIS — I700 Atherosclerosis of aorta: Secondary | ICD-10-CM | POA: Diagnosis not present

## 2018-08-12 DIAGNOSIS — M25511 Pain in right shoulder: Secondary | ICD-10-CM | POA: Diagnosis not present

## 2018-08-12 DIAGNOSIS — I25118 Atherosclerotic heart disease of native coronary artery with other forms of angina pectoris: Secondary | ICD-10-CM | POA: Diagnosis not present

## 2018-08-12 DIAGNOSIS — N184 Chronic kidney disease, stage 4 (severe): Secondary | ICD-10-CM | POA: Diagnosis not present

## 2018-08-12 DIAGNOSIS — Z794 Long term (current) use of insulin: Secondary | ICD-10-CM | POA: Diagnosis not present

## 2018-08-12 DIAGNOSIS — I10 Essential (primary) hypertension: Secondary | ICD-10-CM | POA: Diagnosis not present

## 2018-09-29 DIAGNOSIS — M7501 Adhesive capsulitis of right shoulder: Secondary | ICD-10-CM | POA: Diagnosis not present

## 2018-09-29 DIAGNOSIS — G8929 Other chronic pain: Secondary | ICD-10-CM | POA: Diagnosis not present

## 2018-09-29 DIAGNOSIS — M25511 Pain in right shoulder: Secondary | ICD-10-CM | POA: Diagnosis not present

## 2018-09-30 ENCOUNTER — Other Ambulatory Visit: Payer: Self-pay

## 2018-09-30 ENCOUNTER — Ambulatory Visit
Admission: RE | Admit: 2018-09-30 | Discharge: 2018-09-30 | Disposition: A | Discharge status: Home / Self Care | Payer: Medicare HMO | Source: Ambulatory Visit | Attending: Urology | Admitting: Urology

## 2018-09-30 DIAGNOSIS — N2889 Other specified disorders of kidney and ureter: Secondary | ICD-10-CM

## 2018-10-05 ENCOUNTER — Telehealth (INDEPENDENT_AMBULATORY_CARE_PROVIDER_SITE_OTHER): Payer: Medicare HMO | Admitting: Urology

## 2018-10-05 ENCOUNTER — Other Ambulatory Visit: Payer: Self-pay

## 2018-10-05 DIAGNOSIS — N2889 Other specified disorders of kidney and ureter: Secondary | ICD-10-CM | POA: Diagnosis not present

## 2018-10-05 NOTE — Progress Notes (Addendum)
Virtual Visit via Telephone Note  I connected with Mckenzie Wright on 10/05/18 at 10:30 AM EDT by telephone and verified that I am speaking with the correct person using two identifiers.   I discussed the limitations, risks, security and privacy concerns of performing an evaluation and management service by telephone and the availability of in person appointments. We discussed the impact of the COVID-19 on the healthcare system, and the importance of social distancing and reducing patient and provider exposure. I also discussed with the patient that there may be a patient responsible charge related to this service. The patient expressed understanding and agreed to proceed.  Reason for visit: Follow up small left renal mass  History of Present Illness: Mckenzie Wright is a 73 year old female with a number of comorbidities including multiple hip replacements, diabetes, history of stroke, and chronic kidney disease with baseline eGFR of 30 who I originally saw in October 2019 for a incidentally found 1 cm enhancing left renal mass on CT performed after a fall.  With her comorbidities and small mass size, we elected for follow-up with renal ultrasound in April 2020.  Ultrasound performed in mid April 2020 showed enlargement of the left renal mass to 2.4 cm, worrisome for RCC.  She denies any weight loss, bone pain, gross hematuria, or flank pain.  She also had a CT chest on 05/07/2018 that showed no evidence of metastatic disease.  Assessment and Plan: 73 year old co-morbid female with CKD and baseline eGFR of 30, with enlarging left renal mass, now 2.4 cm on ultrasound April 2020 from 1 cm on CT abdomen in July 2019.  A solid renal mass raises the suspicion of primary renal malignancy.  We discussed this in detail and in regards to the spectrum of renal masses which includes cysts (pure cysts are considered benign), solid masses and everything in between. The risk of metastasis increases as the size of  solid renal mass increases. In general, it is believed that the risk of metastasis for renal masses less than 3-4 cm is small (up to approximately 5%) based mainly on large retrospective studies. In some cases and especially in patients of older age and multiple comorbidities a surveillance approach may be appropriate. The treatment of solid renal masses includes: surveillance, cryoablation (percutaneous and laparoscopic) in addition to partial and complete nephrectomy (each with option of laparoscopic, robotic and open depending on appropriateness). Furthermore, nephrectomy appears to be an independent risk factor for the development of chronic kidney disease suggesting that nephron sparing approaches should be implored whenever feasible. We reviewed these options in context of the patients current situation as well as the pros and cons of each.  With her co-morbidities and CKD, radical or partial nephrectomy are not ideal options.  She would be a much better candidate for percutaneous biopsy and ablation.  We discussed this at length.  Follow Up: -MRI abdomen with and without contrast to better evaluate left-sided renal mass -Call with MRI findings, likely refer to interventional radiology for consideration of biopsy and ablation   I discussed the assessment and treatment plan with the patient. The patient was provided an opportunity to ask questions and all were answered. The patient agreed with the plan and demonstrated an understanding of the instructions.   The patient was advised to call back or seek an in-person evaluation if the symptoms worsen or if the condition fails to improve as anticipated.  I provided 12 minutes of non-face-to-face time during this encounter.  ADDENDUM: MRI shows minimal  growth of left renal mass to 1.3cm from 1cm 12/2017. We discussed options at length again, she elects active surveillance which is very reasonable. Would be a good candidate for percutaneous ablation  if mass started to grow significantly. RTC 1 year with repeat MRI abdomen.   Billey Co, MD

## 2018-10-07 DIAGNOSIS — M7501 Adhesive capsulitis of right shoulder: Secondary | ICD-10-CM | POA: Diagnosis not present

## 2018-10-07 DIAGNOSIS — M6281 Muscle weakness (generalized): Secondary | ICD-10-CM | POA: Diagnosis not present

## 2018-10-07 DIAGNOSIS — M25511 Pain in right shoulder: Secondary | ICD-10-CM | POA: Diagnosis not present

## 2018-10-07 DIAGNOSIS — M25611 Stiffness of right shoulder, not elsewhere classified: Secondary | ICD-10-CM | POA: Diagnosis not present

## 2018-10-11 DIAGNOSIS — M7501 Adhesive capsulitis of right shoulder: Secondary | ICD-10-CM | POA: Diagnosis not present

## 2018-10-13 DIAGNOSIS — M7501 Adhesive capsulitis of right shoulder: Secondary | ICD-10-CM | POA: Diagnosis not present

## 2018-10-19 DIAGNOSIS — M7501 Adhesive capsulitis of right shoulder: Secondary | ICD-10-CM | POA: Diagnosis not present

## 2018-10-21 DIAGNOSIS — M7501 Adhesive capsulitis of right shoulder: Secondary | ICD-10-CM | POA: Diagnosis not present

## 2018-10-26 DIAGNOSIS — M7501 Adhesive capsulitis of right shoulder: Secondary | ICD-10-CM | POA: Diagnosis not present

## 2018-10-28 DIAGNOSIS — M7501 Adhesive capsulitis of right shoulder: Secondary | ICD-10-CM | POA: Diagnosis not present

## 2018-11-02 DIAGNOSIS — M7501 Adhesive capsulitis of right shoulder: Secondary | ICD-10-CM | POA: Diagnosis not present

## 2018-11-04 DIAGNOSIS — M7501 Adhesive capsulitis of right shoulder: Secondary | ICD-10-CM | POA: Diagnosis not present

## 2018-11-09 DIAGNOSIS — M7501 Adhesive capsulitis of right shoulder: Secondary | ICD-10-CM | POA: Diagnosis not present

## 2018-11-10 DIAGNOSIS — G8929 Other chronic pain: Secondary | ICD-10-CM | POA: Diagnosis not present

## 2018-11-10 DIAGNOSIS — M7501 Adhesive capsulitis of right shoulder: Secondary | ICD-10-CM | POA: Diagnosis not present

## 2018-11-10 DIAGNOSIS — M25511 Pain in right shoulder: Secondary | ICD-10-CM | POA: Diagnosis not present

## 2018-11-11 DIAGNOSIS — M7501 Adhesive capsulitis of right shoulder: Secondary | ICD-10-CM | POA: Diagnosis not present

## 2018-11-15 DIAGNOSIS — M7501 Adhesive capsulitis of right shoulder: Secondary | ICD-10-CM | POA: Diagnosis not present

## 2018-11-18 DIAGNOSIS — M7501 Adhesive capsulitis of right shoulder: Secondary | ICD-10-CM | POA: Diagnosis not present

## 2018-11-23 DIAGNOSIS — M7501 Adhesive capsulitis of right shoulder: Secondary | ICD-10-CM | POA: Diagnosis not present

## 2018-11-24 ENCOUNTER — Ambulatory Visit
Admission: RE | Admit: 2018-11-24 | Discharge: 2018-11-24 | Disposition: A | Discharge status: Home / Self Care | Payer: Medicare HMO | Source: Ambulatory Visit | Attending: Urology | Admitting: Urology

## 2018-11-24 ENCOUNTER — Other Ambulatory Visit: Payer: Self-pay

## 2018-11-24 DIAGNOSIS — N2889 Other specified disorders of kidney and ureter: Secondary | ICD-10-CM | POA: Diagnosis not present

## 2018-11-24 LAB — POCT I-STAT CREATININE: Creatinine, Ser: 1.5 mg/dL — ABNORMAL HIGH (ref 0.44–1.00)

## 2018-11-24 IMAGING — MR MRI ABDOMEN WITH AND WITHOUT CONTRAST
20 series · 48 of 48 positions shown · IV contrast (Gadavist)
Comparison: Renal ultrasound dated [DATE]. CT abdomen/pelvis
dated [DATE].

CLINICAL DATA: Follow-up left renal mass on ultrasound

EXAM:
MRI ABDOMEN WITHOUT AND WITH CONTRAST
TECHNIQUE: Multiplanar multisequence MR imaging of the abdomen was performed
both before and after the administration of intravenous contrast.
CONTRAST:  7 mL Gadovist IV

[Series 2: cor haste · coronal · 6.0mm · 1.19mm/px · 2 of 32 slices shown]
[im 1/32]
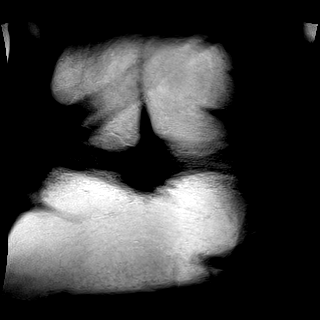
[im 32/32]
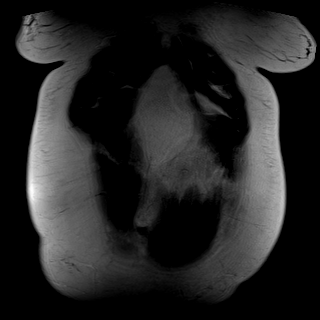

[Series 5: T2 fat-sat · axial · 6.0mm · 1.19mm/px · z∈[-28,+195]mm · 2 of 32 slices shown]
[im 1/32]
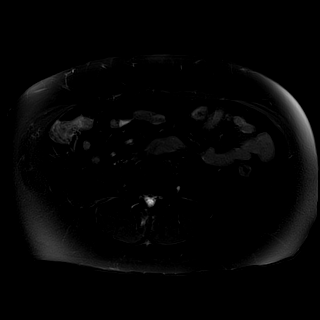
[im 32/32]
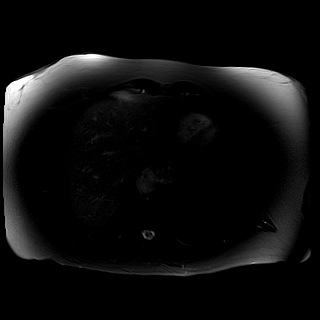

[Series 6: DWI · axial · 6.0mm · 1.42mm/px · z∈[-28,+195]mm · 2 of 32 slices shown (1 of 4)]
[im 1/32]
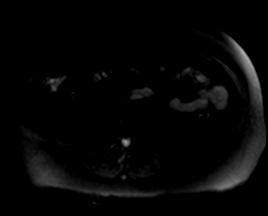
[im 32/32]
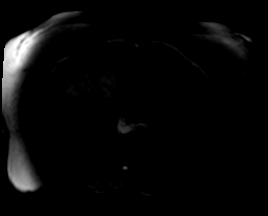

[Series 6: DWI · axial · 6.0mm · 1.42mm/px · z∈[-28,+195]mm · 2 of 32 slices shown (2 of 4)]
[im 1/32]
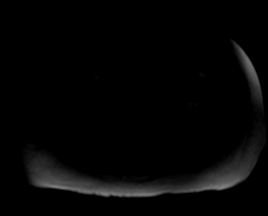
[im 32/32]
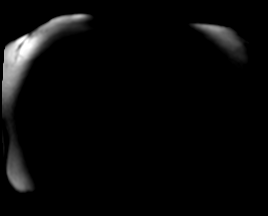

[Series 6: DWI · axial · 6.0mm · 1.42mm/px · 1 of 32 slices shown (3 of 4)]
[im 1/32]
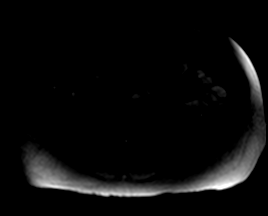

[Series 7: DWI · axial · 6.0mm · 1.42mm/px · 1 of 32 slices shown (4 of 4)]
[im 1/32]
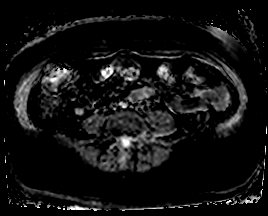

[Series 8: ax in & · axial · 3.0mm · 1.19mm/px · z∈[-23,+190]mm · 3 of 72 slices shown (1 of 2)]
[im 1/72]
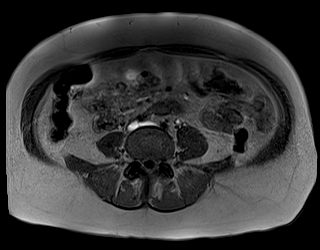
[im 36/72]
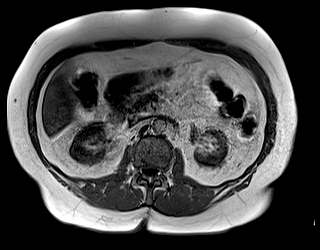
[im 72/72]
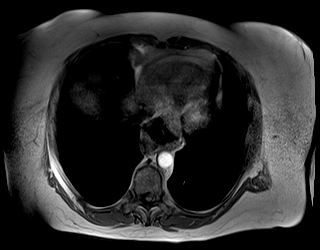

[Series 8: ax in & · axial · 3.0mm · 1.19mm/px · z∈[-23,+190]mm · 3 of 72 slices shown (2 of 2)]
[im 1/72]
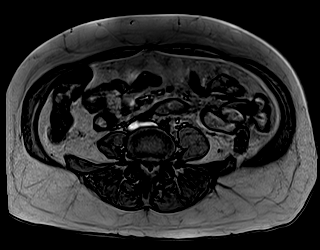
[im 36/72]
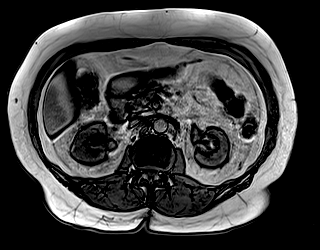
[im 72/72]
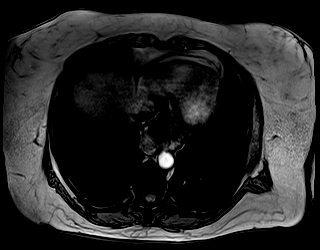

[Series 9: bSSFP · axial · 6.0mm · 0.74mm/px · 1 of 32 slices shown]
[im 1/32]
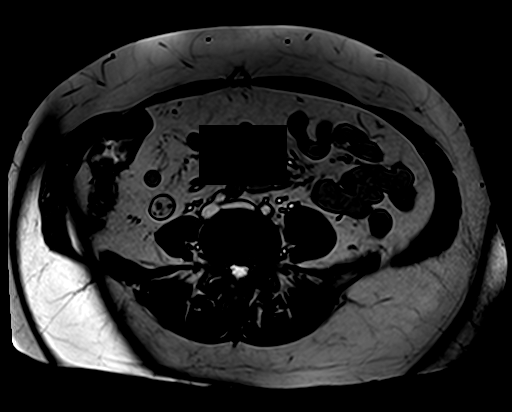

[Series 10: T1 dynamic · axial · non-contrast · 3.0mm · 1.19mm/px · z∈[-7,+206]mm · 3 of 72 slices shown (1 of 9)]
[im 1/72]
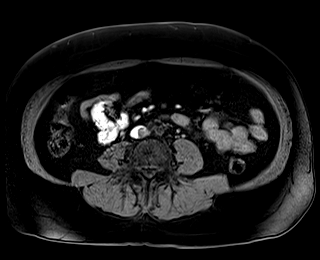
[im 36/72]
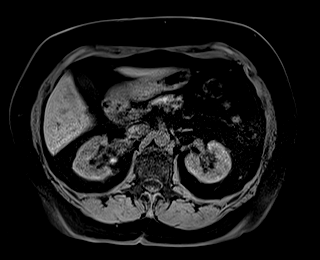
[im 72/72]
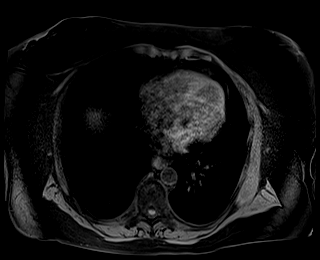

[Series 11: T1 dynamic · axial · 3.0mm · 1.19mm/px · z∈[-7,+206]mm · 3 of 72 slices shown (2 of 9)]
[im 1/72]
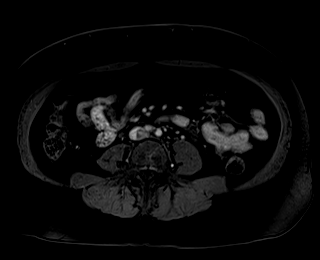
[im 36/72]
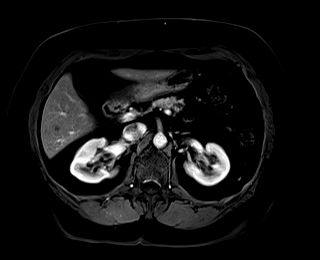
[im 72/72]
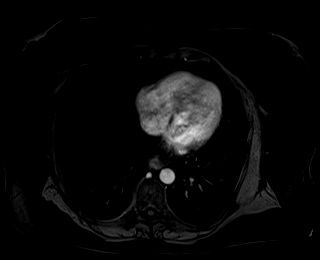

[Series 12: T1 dynamic · axial · 3.0mm · 1.19mm/px · z∈[-7,+206]mm · 3 of 72 slices shown (3 of 9)]
[im 1/72]
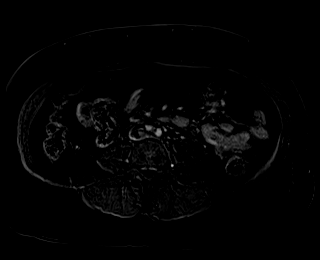
[im 36/72]
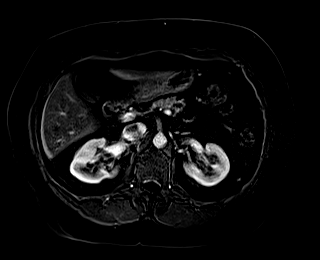
[im 72/72]
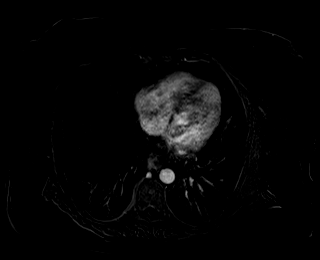

[Series 13: T1 dynamic · axial · 3.0mm · 1.19mm/px · z∈[-7,+206]mm · 3 of 72 slices shown (4 of 9)]
[im 1/72]
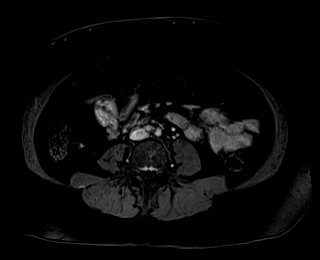
[im 36/72]
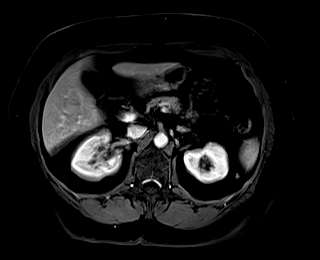
[im 72/72]
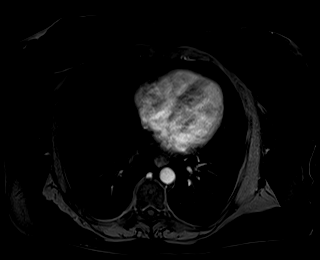

[Series 14: T1 dynamic · axial · 3.0mm · 1.19mm/px · z∈[-7,+206]mm · 3 of 72 slices shown (5 of 9)]
[im 1/72]
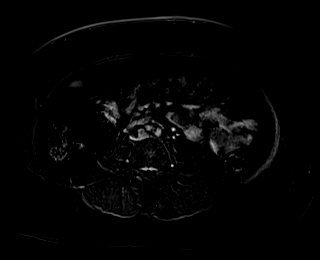
[im 36/72]
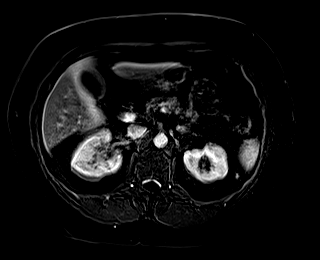
[im 72/72]
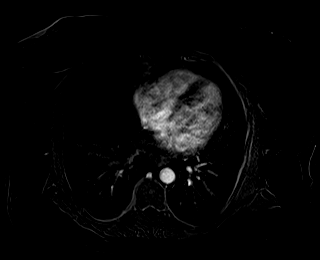

[Series 15: T1 dynamic · axial · 3.0mm · 1.19mm/px · z∈[-7,+206]mm · 3 of 72 slices shown (6 of 9)]
[im 1/72]
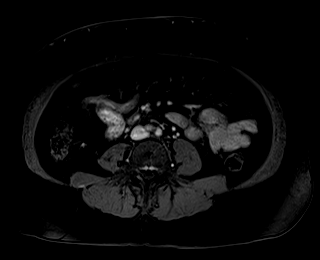
[im 36/72]
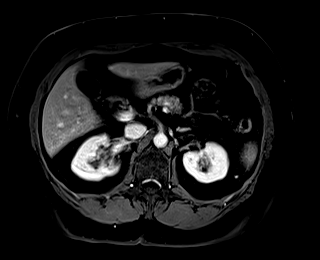
[im 72/72]
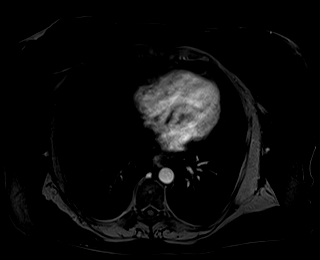

[Series 16: T1 dynamic · axial · 3.0mm · 1.19mm/px · z∈[-7,+206]mm · 3 of 72 slices shown (7 of 9)]
[im 1/72]
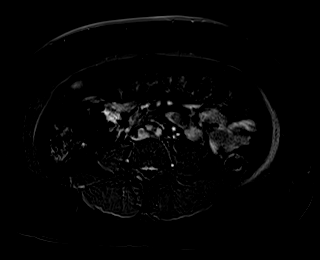
[im 36/72]
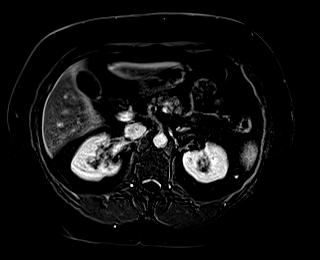
[im 72/72]
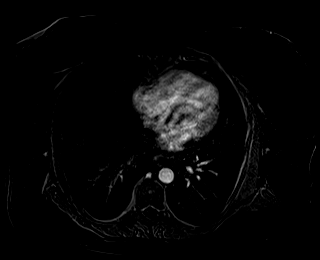

[Series 17: T1 dynamic post-contrast · coronal · 3.0mm · 1.31mm/px · 3 of 80 slices shown]
[im 1/80]
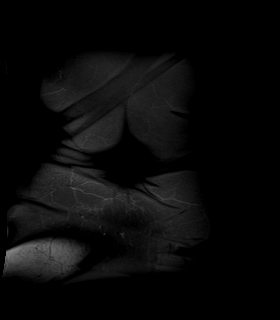
[im 40/80]
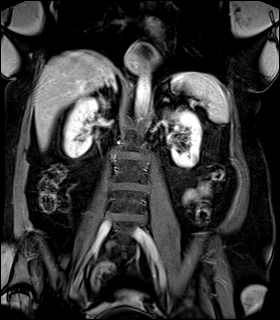
[im 80/80]
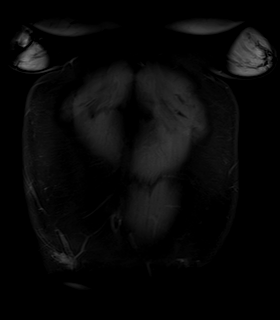

[Series 18: T2 · axial · 6.0mm · 1.19mm/px · 1 of 32 slices shown]
[im 1/32]
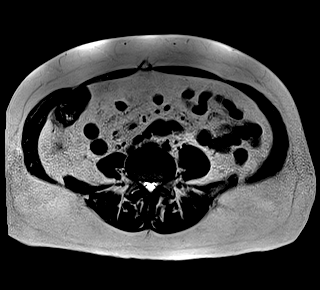

[Series 19: T1 dynamic · axial · 3.0mm · 1.19mm/px · z∈[-7,+206]mm · 3 of 72 slices shown (8 of 9)]
[im 1/72]
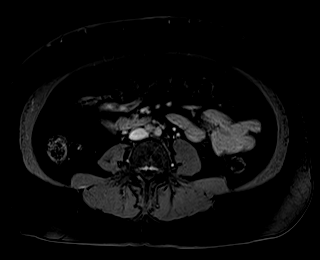
[im 36/72]
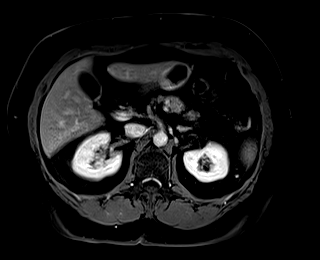
[im 72/72]
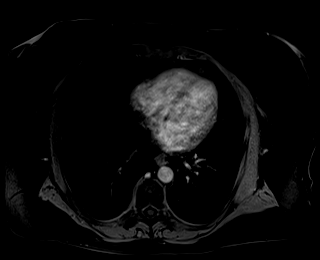

[Series 20: T1 dynamic · axial · 3.0mm · 1.19mm/px · z∈[-7,+206]mm · 3 of 72 slices shown (9 of 9)]
[im 1/72]
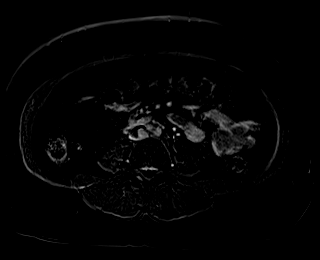
[im 36/72]
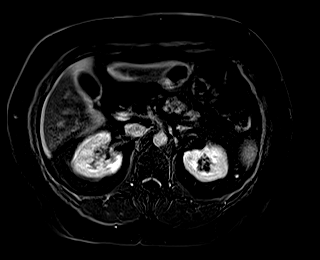
[im 72/72]
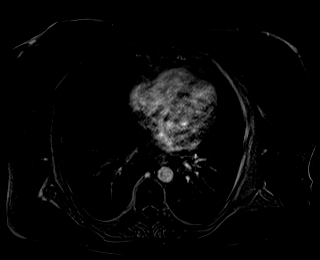

[48 of 48 positions shown; findings below may reference images not displayed]

FINDINGS: Motion degraded images.

Lower chest: Lung bases are clear.

Hepatobiliary: Liver is within normal limits. No suspicious
enhancing hepatic lesions.

Gallbladder is unremarkable. No intrahepatic or extrahepatic ductal
dilatation

Pancreas: 8 mm unilocular cyst along the pancreatic tail (series
5/image 13), likely reflecting a benign pseudocyst or IPMN. No
pancreatic ductal dilatation or atrophy.

Spleen:  Within normal limits.

Adrenals/Urinary Tract:  Adrenal glands are within normal limits.

Right kidney is within normal limits.

Two left lower pole renal cysts measuring up to 11 mm (series
5/image 17), benign (Bosniak I). Additional subcentimeter
hemorrhagic lateral left lower pole renal cyst (series 10/image 40).
Additional 13 mm enhancing left lower pole renal lesion (series
13/image 85), corresponding to the sonographic abnormality,
suspicious for solid renal neoplasm.

No hydronephrosis.

Stomach/Bowel: Stomach is notable for a moderate hiatal hernia.

Visualized bowel is unremarkable.

Vascular/Lymphatic:  No evidence of abdominal aortic aneurysm.

Single left renal artery and vein.  No renal vein invasion.

No suspicious abdominal lymphadenopathy.

Other:  No abdominal ascites.

Musculoskeletal: No focal osseous lesions.
IMPRESSION: Motion degraded images.

13 mm enhancing left lower pole renal lesion, corresponding to the
sonographic abnormality, suspicious for solid renal neoplasm.

Single left renal artery and vein. No renal vein invasion. No
regional lymphadenopathy or metastatic disease.

Additional ancillary findings as above.

## 2018-11-24 MED ORDER — GADOBUTROL 1 MMOL/ML IV SOLN
7.0000 mL | Freq: Once | INTRAVENOUS | Status: AC | PRN
  Administered 2018-11-24: 10:00:00 7 mL via INTRAVENOUS

## 2018-11-25 ENCOUNTER — Other Ambulatory Visit: Payer: Self-pay | Admitting: Urology

## 2018-11-25 DIAGNOSIS — N2889 Other specified disorders of kidney and ureter: Secondary | ICD-10-CM

## 2018-12-22 DIAGNOSIS — E1129 Type 2 diabetes mellitus with other diabetic kidney complication: Secondary | ICD-10-CM | POA: Diagnosis not present

## 2018-12-22 DIAGNOSIS — N2889 Other specified disorders of kidney and ureter: Secondary | ICD-10-CM | POA: Diagnosis not present

## 2018-12-22 DIAGNOSIS — N183 Chronic kidney disease, stage 3 (moderate): Secondary | ICD-10-CM | POA: Diagnosis not present

## 2018-12-22 DIAGNOSIS — I129 Hypertensive chronic kidney disease with stage 1 through stage 4 chronic kidney disease, or unspecified chronic kidney disease: Secondary | ICD-10-CM | POA: Diagnosis not present

## 2019-01-10 IMAGING — US US RENAL
1 series · 14 of 25 positions shown · non-contrast
Comparison: [DATE]

CLINICAL DATA: Renal mass

EXAM:
RENAL / URINARY TRACT ULTRASOUND COMPLETE

[Series 1: us renal · 0.23mm/px · 14 of 60 slices shown]
[im 1/60]
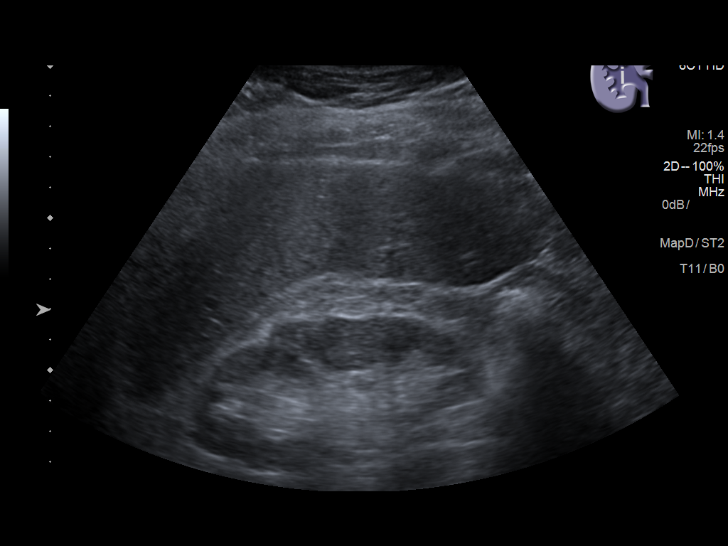
[im 5/60]
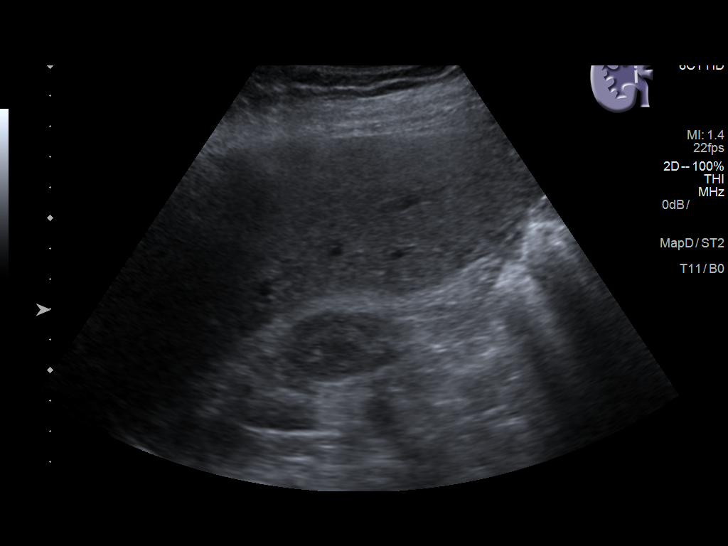
[im 10/60]
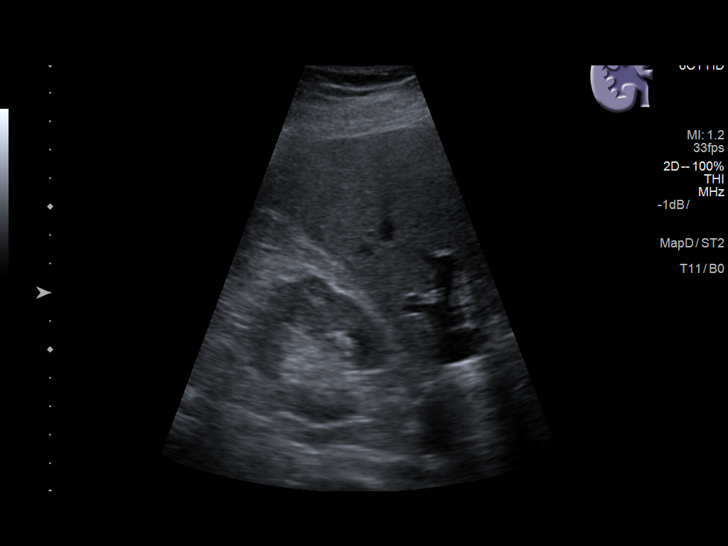
[im 15/60]
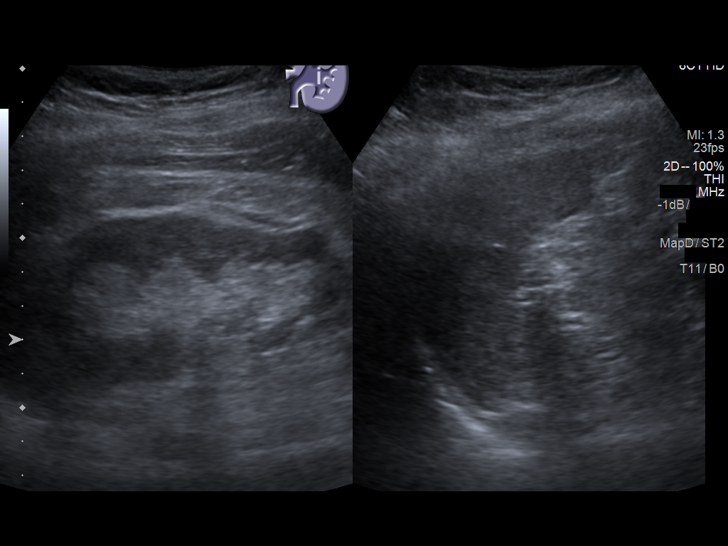
[im 20/60]
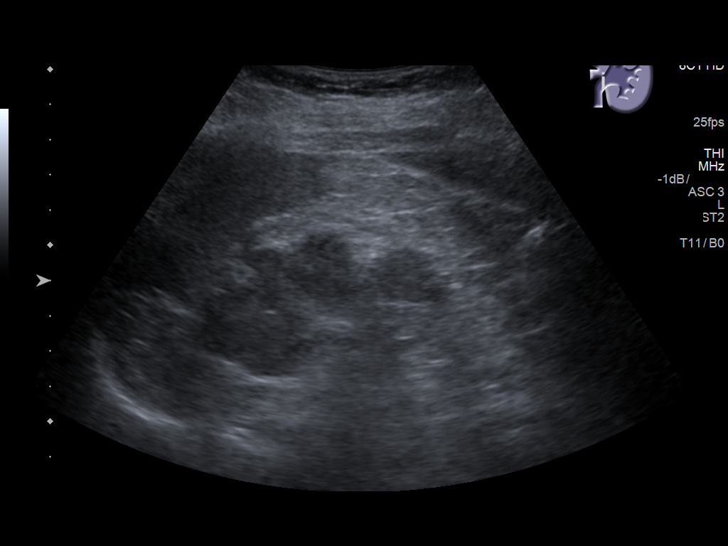
[im 23/60]
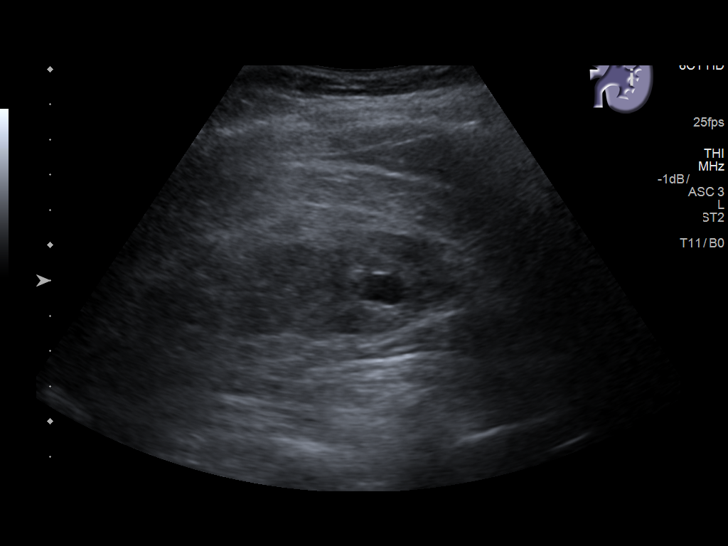
[im 28/60]
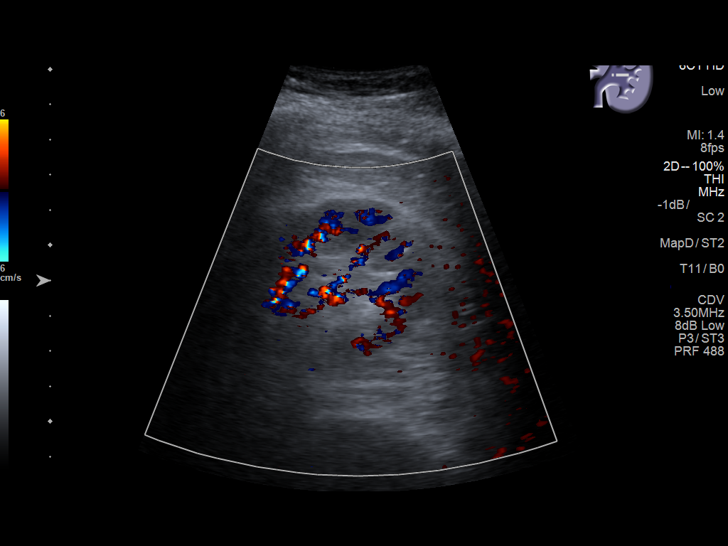
[im 32/60]
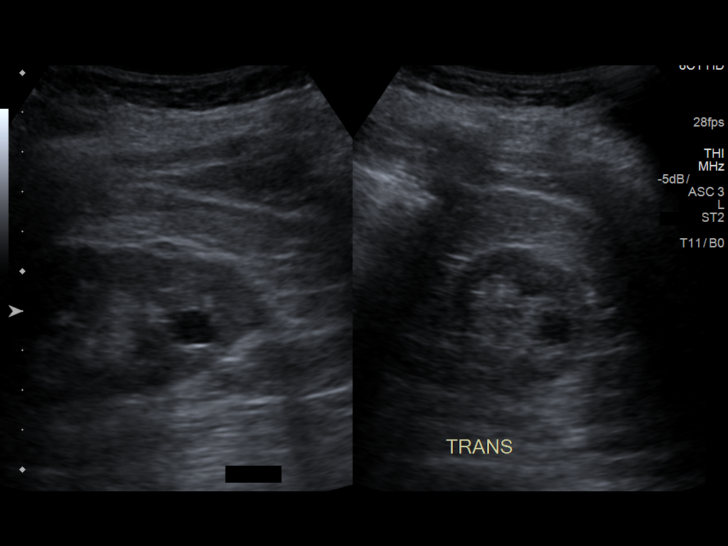
[im 37/60]
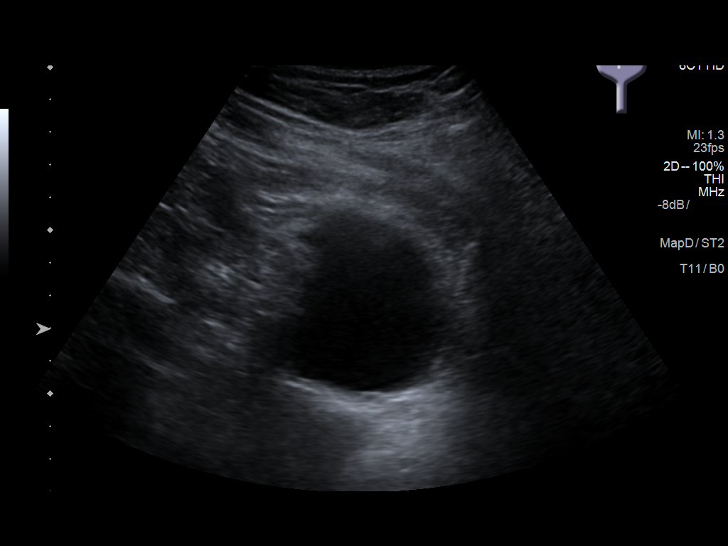
[im 40/60]
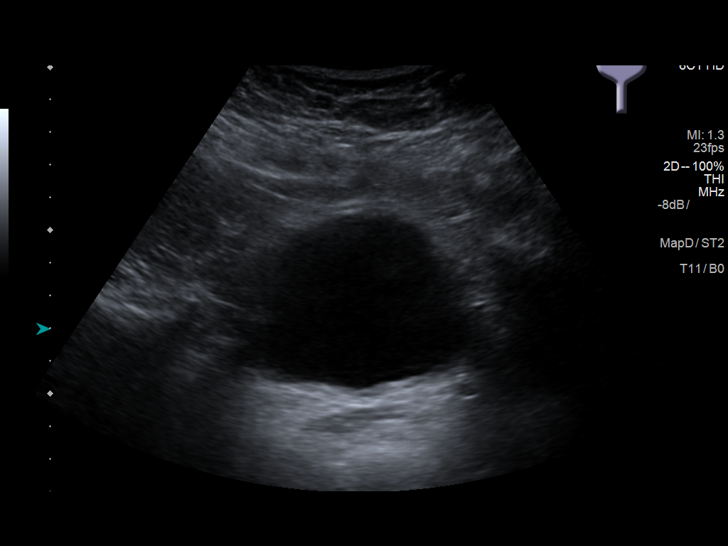
[im 45/60]
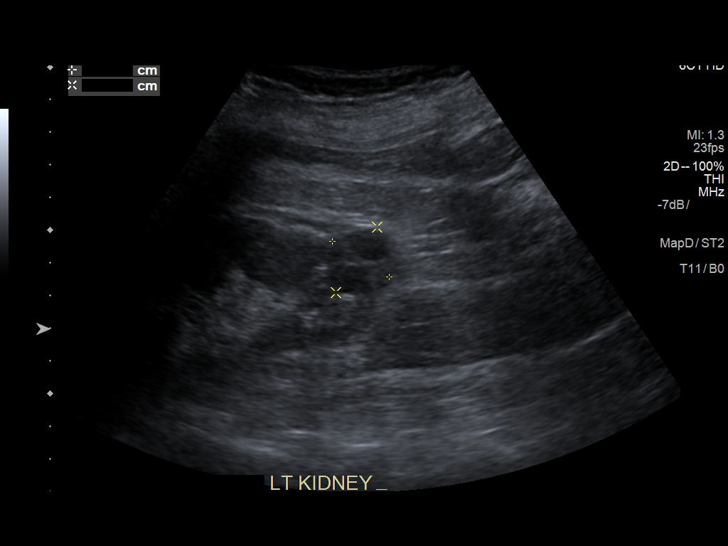
[im 50/60]
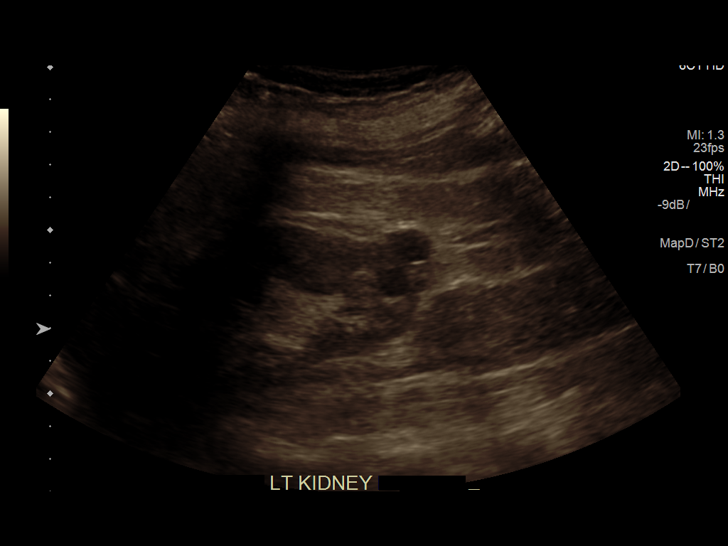
[im 55/60]
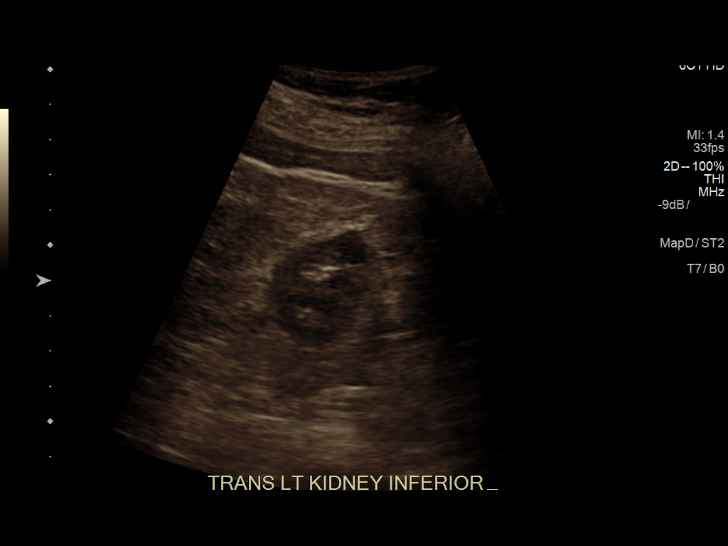
[im 60/60]
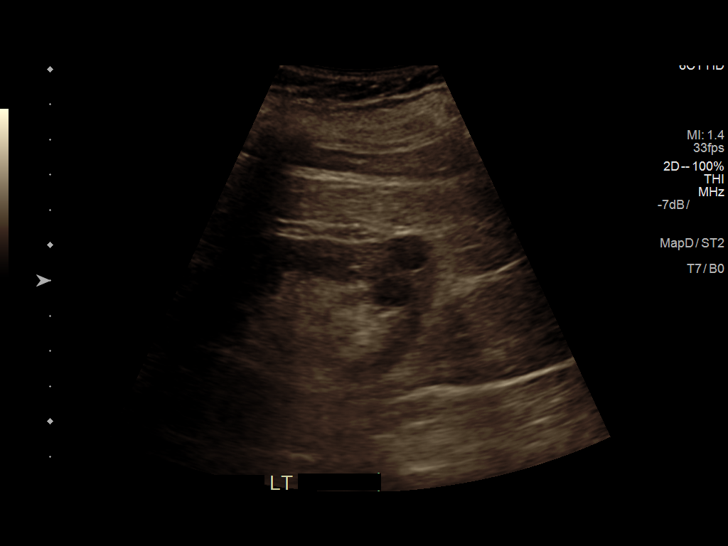

[14 of 25 positions shown; findings below may reference images not displayed]

FINDINGS: Right Kidney:

Renal measurements: 9.6 x 4.7 x 5.3 cm = volume: 125.8 mL .
Echogenicity within normal limits. No mass or hydronephrosis
visualized.

Left Kidney:

Renal measurements: 9.6 x 9.5 x 5.0 cm = volume: 111.3 mL. Normal
cortical echogenicity. No hydronephrosis. 1.3 x 1.0 x 0.9 cm complex
cyst in the lower pole with hypoechoic signal centrally. There is a
complex and heterogeneous mass emanating from the lower pole of the
left kidney measuring 2.1 x 2.4 x 1.4 cm. This measured 1.0 cm on
the previous CT.

Bladder:

Appears normal for degree of bladder distention.
IMPRESSION: 2.4 cm complex mass in the lower pole of the left kidney is
worrisome for renal cell carcinoma. It has a enlarged compared to
the prior CT. MRI with and without contrast is recommended.

## 2019-01-12 DIAGNOSIS — Z0000 Encounter for general adult medical examination without abnormal findings: Secondary | ICD-10-CM | POA: Insufficient documentation

## 2019-01-12 DIAGNOSIS — I25118 Atherosclerotic heart disease of native coronary artery with other forms of angina pectoris: Secondary | ICD-10-CM | POA: Diagnosis not present

## 2019-01-12 DIAGNOSIS — Z794 Long term (current) use of insulin: Secondary | ICD-10-CM | POA: Diagnosis not present

## 2019-01-12 DIAGNOSIS — N184 Chronic kidney disease, stage 4 (severe): Secondary | ICD-10-CM | POA: Diagnosis not present

## 2019-01-12 DIAGNOSIS — E039 Hypothyroidism, unspecified: Secondary | ICD-10-CM | POA: Diagnosis not present

## 2019-01-12 DIAGNOSIS — N2581 Secondary hyperparathyroidism of renal origin: Secondary | ICD-10-CM | POA: Diagnosis not present

## 2019-01-12 DIAGNOSIS — E7800 Pure hypercholesterolemia, unspecified: Secondary | ICD-10-CM | POA: Diagnosis not present

## 2019-01-12 DIAGNOSIS — E1122 Type 2 diabetes mellitus with diabetic chronic kidney disease: Secondary | ICD-10-CM | POA: Diagnosis not present

## 2019-01-12 DIAGNOSIS — I10 Essential (primary) hypertension: Secondary | ICD-10-CM | POA: Diagnosis not present

## 2019-04-05 DIAGNOSIS — E039 Hypothyroidism, unspecified: Secondary | ICD-10-CM | POA: Diagnosis not present

## 2019-04-27 DIAGNOSIS — I959 Hypotension, unspecified: Secondary | ICD-10-CM | POA: Diagnosis not present

## 2019-04-27 DIAGNOSIS — E162 Hypoglycemia, unspecified: Secondary | ICD-10-CM | POA: Diagnosis not present

## 2019-04-27 DIAGNOSIS — E161 Other hypoglycemia: Secondary | ICD-10-CM | POA: Diagnosis not present

## 2019-04-27 DIAGNOSIS — R4020 Unspecified coma: Secondary | ICD-10-CM | POA: Diagnosis not present

## 2019-04-29 DIAGNOSIS — E1122 Type 2 diabetes mellitus with diabetic chronic kidney disease: Secondary | ICD-10-CM | POA: Diagnosis not present

## 2019-04-29 DIAGNOSIS — Z135 Encounter for screening for eye and ear disorders: Secondary | ICD-10-CM | POA: Diagnosis not present

## 2019-04-29 DIAGNOSIS — N184 Chronic kidney disease, stage 4 (severe): Secondary | ICD-10-CM | POA: Diagnosis not present

## 2019-04-29 DIAGNOSIS — Z794 Long term (current) use of insulin: Secondary | ICD-10-CM | POA: Diagnosis not present

## 2019-04-29 DIAGNOSIS — H35363 Drusen (degenerative) of macula, bilateral: Secondary | ICD-10-CM | POA: Diagnosis not present

## 2019-04-29 DIAGNOSIS — H35373 Puckering of macula, bilateral: Secondary | ICD-10-CM | POA: Diagnosis not present

## 2019-04-29 DIAGNOSIS — E11649 Type 2 diabetes mellitus with hypoglycemia without coma: Secondary | ICD-10-CM | POA: Diagnosis not present

## 2019-05-30 DIAGNOSIS — N184 Chronic kidney disease, stage 4 (severe): Secondary | ICD-10-CM | POA: Diagnosis not present

## 2019-05-30 DIAGNOSIS — E039 Hypothyroidism, unspecified: Secondary | ICD-10-CM | POA: Diagnosis not present

## 2019-05-30 DIAGNOSIS — E1122 Type 2 diabetes mellitus with diabetic chronic kidney disease: Secondary | ICD-10-CM | POA: Diagnosis not present

## 2019-05-30 DIAGNOSIS — I10 Essential (primary) hypertension: Secondary | ICD-10-CM | POA: Diagnosis not present

## 2019-05-30 DIAGNOSIS — E7800 Pure hypercholesterolemia, unspecified: Secondary | ICD-10-CM | POA: Diagnosis not present

## 2019-05-30 DIAGNOSIS — Z794 Long term (current) use of insulin: Secondary | ICD-10-CM | POA: Diagnosis not present

## 2019-05-30 DIAGNOSIS — I25118 Atherosclerotic heart disease of native coronary artery with other forms of angina pectoris: Secondary | ICD-10-CM | POA: Diagnosis not present

## 2019-05-30 DIAGNOSIS — N2581 Secondary hyperparathyroidism of renal origin: Secondary | ICD-10-CM | POA: Diagnosis not present

## 2019-07-25 DIAGNOSIS — H35432 Paving stone degeneration of retina, left eye: Secondary | ICD-10-CM | POA: Diagnosis not present

## 2019-09-26 DIAGNOSIS — N184 Chronic kidney disease, stage 4 (severe): Secondary | ICD-10-CM | POA: Diagnosis not present

## 2019-09-26 DIAGNOSIS — Z794 Long term (current) use of insulin: Secondary | ICD-10-CM | POA: Diagnosis not present

## 2019-09-26 DIAGNOSIS — E1122 Type 2 diabetes mellitus with diabetic chronic kidney disease: Secondary | ICD-10-CM | POA: Diagnosis not present

## 2019-09-26 DIAGNOSIS — I25118 Atherosclerotic heart disease of native coronary artery with other forms of angina pectoris: Secondary | ICD-10-CM | POA: Diagnosis not present

## 2019-09-26 DIAGNOSIS — E7800 Pure hypercholesterolemia, unspecified: Secondary | ICD-10-CM | POA: Diagnosis not present

## 2019-10-03 DIAGNOSIS — E1122 Type 2 diabetes mellitus with diabetic chronic kidney disease: Secondary | ICD-10-CM | POA: Diagnosis not present

## 2019-10-03 DIAGNOSIS — I700 Atherosclerosis of aorta: Secondary | ICD-10-CM | POA: Diagnosis not present

## 2019-10-03 DIAGNOSIS — N2581 Secondary hyperparathyroidism of renal origin: Secondary | ICD-10-CM | POA: Diagnosis not present

## 2019-10-03 DIAGNOSIS — R05 Cough: Secondary | ICD-10-CM | POA: Diagnosis not present

## 2019-10-03 DIAGNOSIS — I25118 Atherosclerotic heart disease of native coronary artery with other forms of angina pectoris: Secondary | ICD-10-CM | POA: Diagnosis not present

## 2019-10-03 DIAGNOSIS — N184 Chronic kidney disease, stage 4 (severe): Secondary | ICD-10-CM | POA: Diagnosis not present

## 2019-10-03 DIAGNOSIS — E039 Hypothyroidism, unspecified: Secondary | ICD-10-CM | POA: Diagnosis not present

## 2019-10-03 DIAGNOSIS — E7800 Pure hypercholesterolemia, unspecified: Secondary | ICD-10-CM | POA: Diagnosis not present

## 2019-10-03 DIAGNOSIS — Z794 Long term (current) use of insulin: Secondary | ICD-10-CM | POA: Diagnosis not present

## 2019-11-21 ENCOUNTER — Other Ambulatory Visit: Payer: Self-pay | Admitting: Urology

## 2019-11-21 DIAGNOSIS — N2889 Other specified disorders of kidney and ureter: Secondary | ICD-10-CM

## 2019-12-01 ENCOUNTER — Ambulatory Visit: Payer: Medicare HMO | Admitting: Urology

## 2019-12-05 ENCOUNTER — Ambulatory Visit
Admission: RE | Admit: 2019-12-05 | Discharge: 2019-12-05 | Disposition: A | Discharge status: Home / Self Care | Payer: Medicare HMO | Source: Ambulatory Visit | Attending: Urology | Admitting: Urology

## 2019-12-05 ENCOUNTER — Other Ambulatory Visit: Payer: Self-pay

## 2019-12-05 DIAGNOSIS — N2889 Other specified disorders of kidney and ureter: Secondary | ICD-10-CM | POA: Insufficient documentation

## 2019-12-05 IMAGING — MR MR ABDOMEN WO/W CM
17 series · 48 of 48 positions shown · IV contrast (gadavist)
Comparison: [DATE]

CLINICAL DATA: Left kidney lower pole renal mass surveillance

EXAM:
MRI ABDOMEN WITHOUT AND WITH CONTRAST
TECHNIQUE: Multiplanar multisequence MR imaging of the abdomen was performed
both before and after the administration of intravenous contrast.
CONTRAST:  7mL GADAVIST GADOBUTROL 1 MMOL/ML IV SOLN

[Series 3: cor haste · coronal · 6.0mm · 1.19mm/px · 2 of 32 slices shown]
[im 1/32]
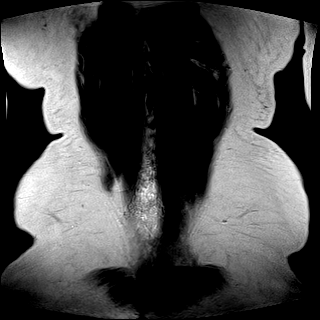
[im 32/32]
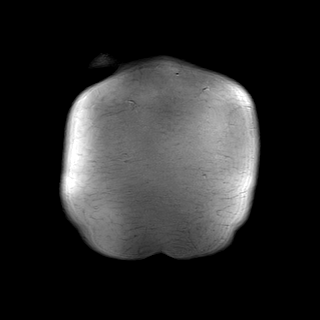

[Series 5: T2 fat-sat · axial · 6.0mm · 1.19mm/px · z∈[-97,+126]mm · 2 of 32 slices shown]
[im 1/32]
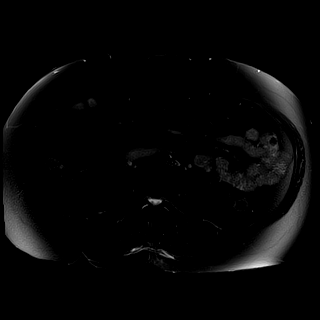
[im 32/32]
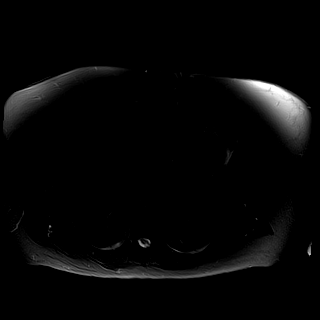

[Series 7: DWI · axial · 6.0mm · 1.42mm/px · z∈[-97,+126]mm · 5 of 96 slices shown (1 of 2)]
[im 1/96]
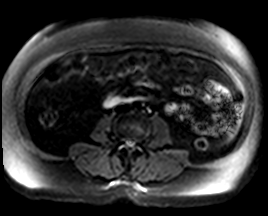
[im 24/96]
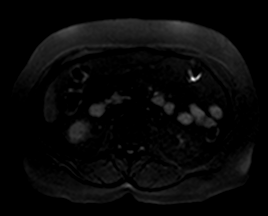
[im 48/96]
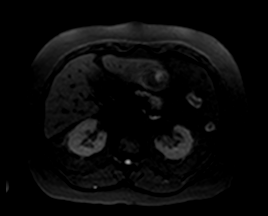
[im 72/96]
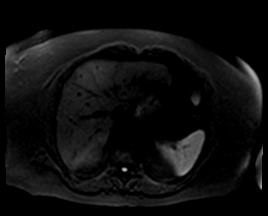
[im 96/96]
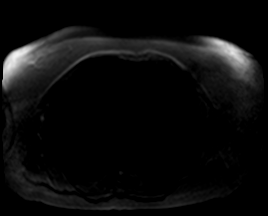

[Series 8: DWI · axial · 6.0mm · 1.42mm/px · 1 of 32 slices shown (2 of 2)]
[im 1/32]
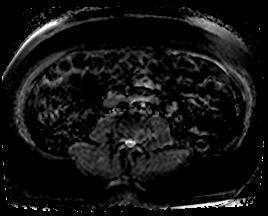

[Series 9: ax in & · axial · 3.0mm · 1.19mm/px · z∈[-92,+121]mm · 6 of 144 slices shown]
[im 1/144]
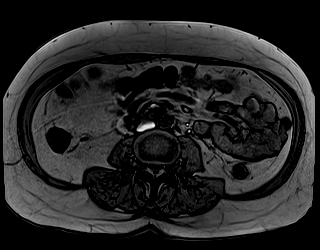
[im 29/144]
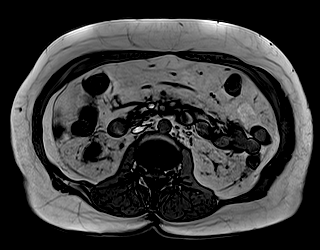
[im 58/144]
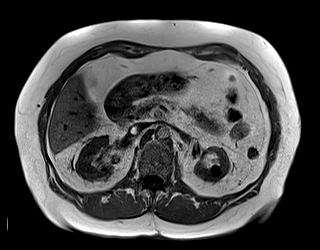
[im 86/144]
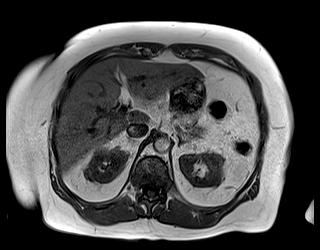
[im 115/144]
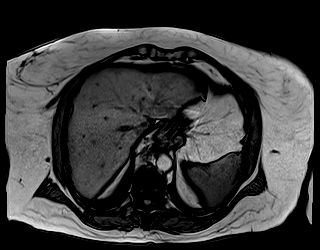
[im 144/144]
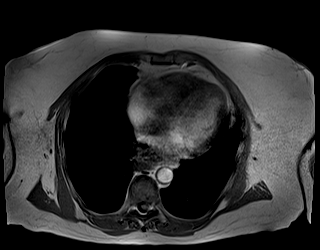

[Series 10: bSSFP · axial · 6.0mm · 0.74mm/px · 1 of 32 slices shown]
[im 1/32]
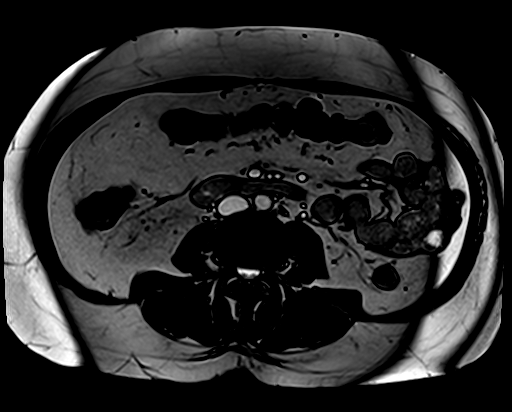

[Series 11: T1 dynamic · axial · non-contrast · 3.0mm · 1.19mm/px · z∈[-92,+121]mm · 3 of 72 slices shown (1 of 9)]
[im 1/72]
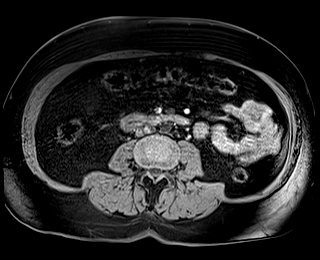
[im 36/72]
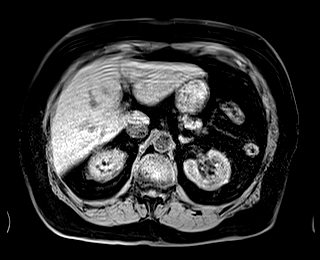
[im 72/72]
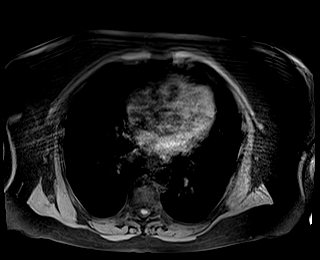

[Series 12: T1 dynamic · axial · 3.0mm · 1.19mm/px · z∈[-92,+121]mm · 3 of 72 slices shown (2 of 9)]
[im 1/72]
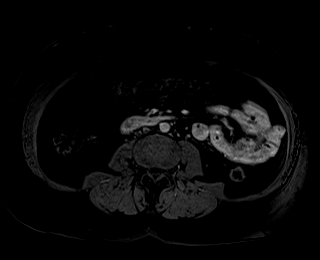
[im 36/72]
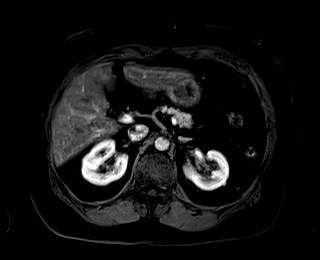
[im 72/72]
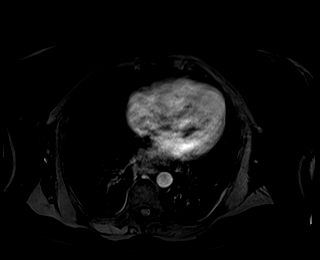

[Series 13: T1 dynamic · axial · 3.0mm · 1.19mm/px · z∈[-92,+121]mm · 3 of 72 slices shown (3 of 9)]
[im 1/72]
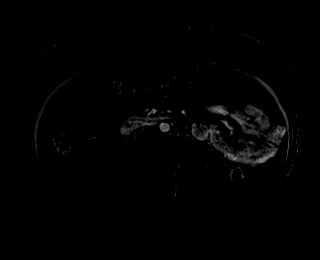
[im 36/72]
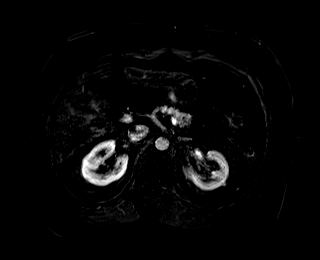
[im 72/72]
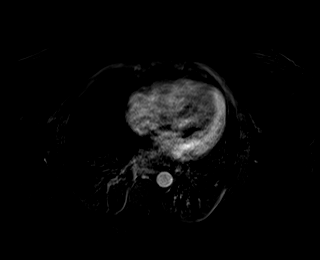

[Series 14: T1 dynamic · axial · 3.0mm · 1.19mm/px · z∈[-92,+121]mm · 3 of 72 slices shown (4 of 9)]
[im 1/72]
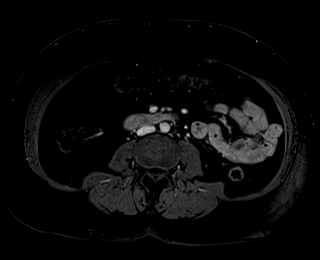
[im 36/72]
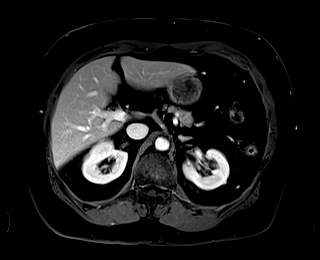
[im 72/72]
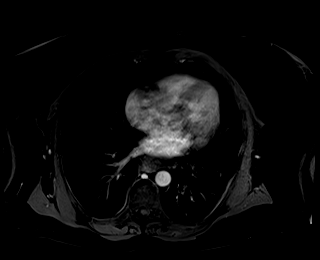

[Series 15: T1 dynamic · axial · 3.0mm · 1.19mm/px · z∈[-92,+121]mm · 3 of 72 slices shown (5 of 9)]
[im 1/72]
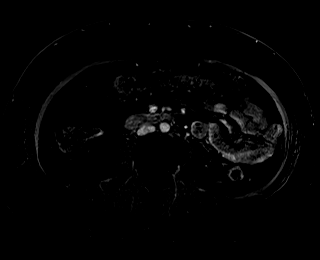
[im 36/72]
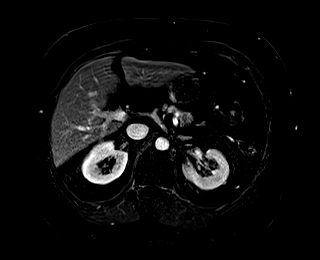
[im 72/72]
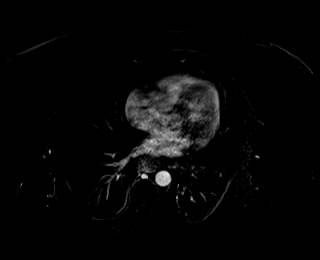

[Series 16: T1 dynamic · axial · 3.0mm · 1.19mm/px · z∈[-92,+121]mm · 3 of 72 slices shown (6 of 9)]
[im 1/72]
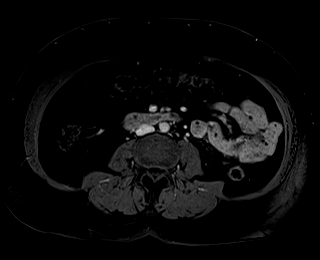
[im 36/72]
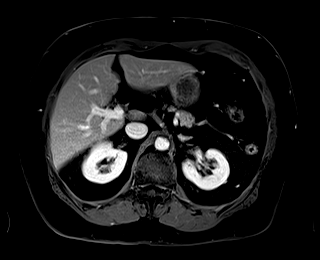
[im 72/72]
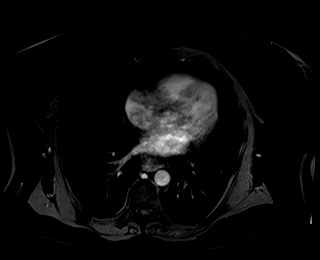

[Series 17: T1 dynamic · axial · 3.0mm · 1.19mm/px · z∈[-92,+121]mm · 3 of 72 slices shown (7 of 9)]
[im 1/72]
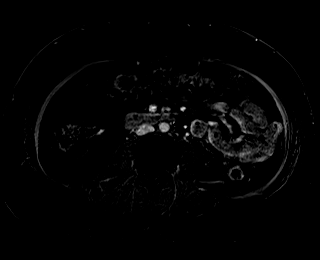
[im 36/72]
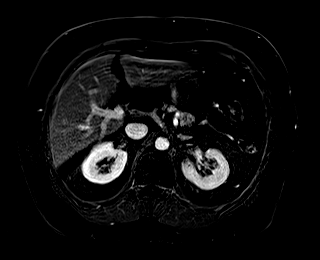
[im 72/72]
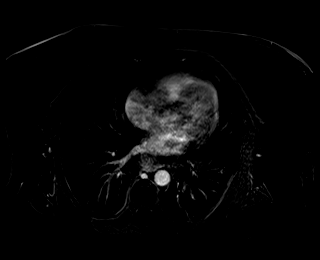

[Series 18: T1 dynamic post-contrast · coronal · 3.0mm · 1.31mm/px · 3 of 80 slices shown]
[im 1/80]
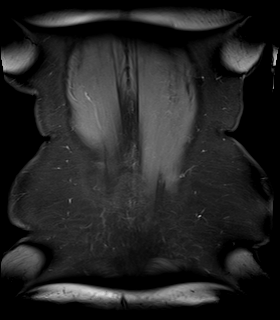
[im 40/80]
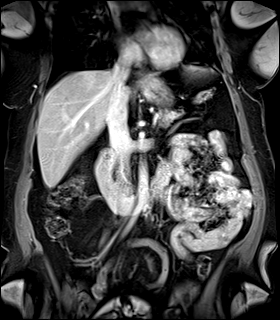
[im 80/80]
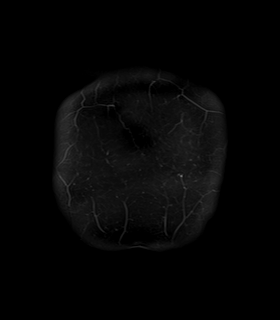

[Series 19: T2 · axial · 6.0mm · 1.19mm/px · 1 of 32 slices shown]
[im 1/32]
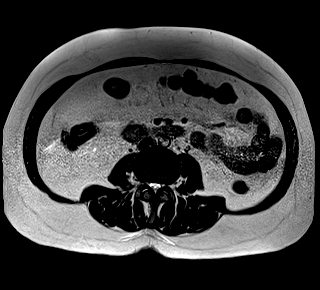

[Series 20: T1 dynamic · axial · 3.0mm · 1.19mm/px · z∈[-92,+121]mm · 3 of 72 slices shown (8 of 9)]
[im 1/72]
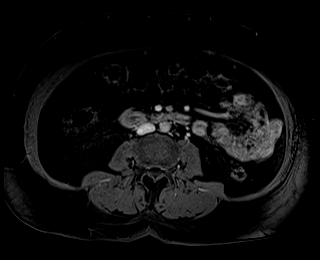
[im 36/72]
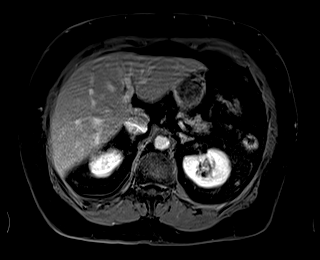
[im 72/72]
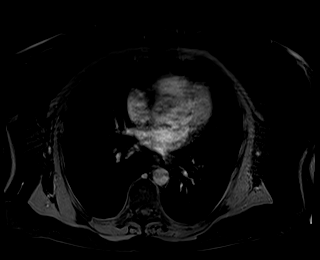

[Series 21: T1 dynamic · axial · 3.0mm · 1.19mm/px · z∈[-92,+121]mm · 3 of 72 slices shown (9 of 9)]
[im 1/72]
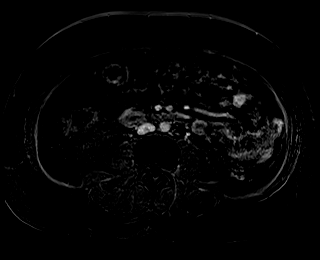
[im 36/72]
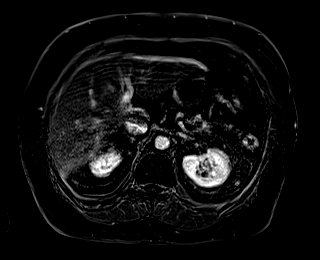
[im 72/72]
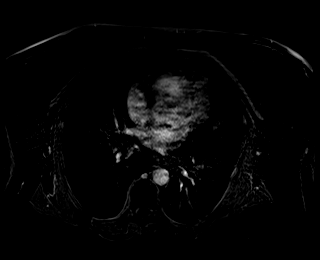

[48 of 48 positions shown; findings below may reference images not displayed]

FINDINGS: Lower chest: Moderate hiatal hernia.

Hepatobiliary: Contracted gallbladder. No biliary dilatation.
Otherwise unremarkable.

Pancreas: 0.9 by 0.7 by 0.6 cm nonenhancing cystic lesion along the
junction of pancreatic body and tail on image [DATE], considered
stable.

Spleen:  Unremarkable

Adrenals/Urinary Tract:  Both adrenal glands appear normal.

0.6 by 0.4 cm Bosniak category 2 cyst of the left mid kidney on
image 40/11 demonstrating high precontrast T1 signal.

1.1 by 1.0 cm Bosniak category 1 cyst of the left kidney lower pole
on image 47/16 sits posteromedial to adjacent mildly complex 1.5 by
1.3 cm lesion with intermediate to high T1 signal characteristics
but no definite observed enhancement based on subtraction images and
direct signal intensity measurements. Specifically, this lesion has
signal intensity measurement internally of 81 precontrast, 82 at 23
seconds, 79 at 45 seconds, and 82 at 90 seconds. Appearance favors a
Bosniak category 2 cyst.

On image 19 of series 5, a 0.5 by 0.4 cm Bosniak category 1 cyst of
the left kidney lower pole is present posteromedially.

Stomach/Bowel: Unremarkable

Vascular/Lymphatic: Aortoiliac atherosclerotic vascular disease. No
pathologic adenopathy.

Other:  No supplemental non-categorized findings.

Musculoskeletal: Degenerative endplate findings at L5-S1.
IMPRESSION: 1. The lesion of concern in the left kidney lower pole has signal
intensity nearly identical on precontrast and postcontrast images,
and accordingly is most compatible with a Bosniak category 2 complex
but benign cyst rather than malignancy. Other benign left renal
cysts are also observed.
2. 0.9 by 0.7 cm nonenhancing cystic lesion along the junction of
the pancreatic body and tail, stable from [DATE]. This documents
1 year of stability. Possibilities include a postinflammatory cystic
lesion or intraductal papillary mucinous neoplasm. Taking into
account the patient's age, lesion size, and characteristics, follow
up pancreatic protocol MRI with and without contrast is recommended
in 2 years time. This recommendation follows ACR consensus
guidelines: Management of Incidental Pancreatic Cysts: A White Paper
of the ACR Incidental Findings Committee. [HOSPITAL]
[D8];[DATE].

3. Moderate hiatal hernia.
4. Degenerative endplate findings at L5-S1.

## 2019-12-05 MED ORDER — GADOBUTROL 1 MMOL/ML IV SOLN
7.0000 mL | Freq: Once | INTRAVENOUS | Status: AC | PRN
  Administered 2019-12-05: 7 mL via INTRAVENOUS

## 2019-12-07 ENCOUNTER — Ambulatory Visit: Payer: Medicare HMO | Admitting: Urology

## 2019-12-07 ENCOUNTER — Other Ambulatory Visit: Payer: Self-pay

## 2019-12-07 ENCOUNTER — Encounter: Payer: Self-pay | Admitting: Urology

## 2019-12-07 VITALS — BP 155/69 | HR 77 | Ht 65.0 in | Wt 180.0 lb

## 2019-12-07 DIAGNOSIS — N2889 Other specified disorders of kidney and ureter: Secondary | ICD-10-CM

## 2019-12-07 NOTE — Progress Notes (Signed)
° °  12/07/2019 10:20 AM   Gery Pray 15-Jul-1945 [id number]  Reason for visit: Follow up small left renal mass  HPI: I saw [name] and her husband in urology clinic today for follow-up of her small 1 cm left renal mass.  She has a number of comorbidities including multiple hip replacements, diabetes, history of stroke, CKD and baseline EGFR of 30.  She had a CT scan in July 2019 that showed a 1 cm heterogenous possibly enhancing lesion at the lower pole of the left kidney.  I had recommended active surveillance with a follow-up renal ultrasound in July 2020 which suggested possible enlargement of the lesion to 2.4 cm.  This was followed by an MRI of the abdomen in June 2020 which suggested a 1.3 cm unchanged lesion in the left lower pole that had not enlarged from prior.  There is no lymphadenopathy or metastatic disease.  There was subtle enhancement suspicious for solid renal neoplasm.  With her comorbidities, she opted for ongoing active surveillance with a repeat MRI in a year.  MRI abdomen was performed yesterday and showed unchanged size of 1.1 cm and there was no definite enhancement.  Radiology felt this was actually more consistent with a Bosniak 2 complex cyst as opposed to definite malignancy.  She denies any changes in her health over the last year, renal function is unchanged with EGFR of 30.  She denies any flank pain, weight loss, or gross hematuria.  We had a long conversation today about her reassuring imaging findings and options moving forward.  We discussed discontinuing surveillance with her MRI findings yesterday, versus repeat MRI in 1 year.  I think is very reasonable to repeat MRI in 1 year and if unchanged at that time likely discontinue ongoing imaging, especially in the setting of her comorbidities.  We also discussed the role of biopsy, however I do not anticipate this would change her management and I discouraged her from pursuing biopsy.  She and her husband are in  agreement with this plan.  RTC 1 year with MRI abdomen prior   Billey Co, MD  [facility] [address], [place],  [id number] [phone]

## 2020-02-08 DIAGNOSIS — Z794 Long term (current) use of insulin: Secondary | ICD-10-CM | POA: Diagnosis not present

## 2020-02-08 DIAGNOSIS — I25118 Atherosclerotic heart disease of native coronary artery with other forms of angina pectoris: Secondary | ICD-10-CM | POA: Diagnosis not present

## 2020-02-08 DIAGNOSIS — E7800 Pure hypercholesterolemia, unspecified: Secondary | ICD-10-CM | POA: Diagnosis not present

## 2020-02-08 DIAGNOSIS — N184 Chronic kidney disease, stage 4 (severe): Secondary | ICD-10-CM | POA: Diagnosis not present

## 2020-02-08 DIAGNOSIS — E1122 Type 2 diabetes mellitus with diabetic chronic kidney disease: Secondary | ICD-10-CM | POA: Diagnosis not present

## 2020-02-15 DIAGNOSIS — I25118 Atherosclerotic heart disease of native coronary artery with other forms of angina pectoris: Secondary | ICD-10-CM | POA: Diagnosis not present

## 2020-02-15 DIAGNOSIS — I700 Atherosclerosis of aorta: Secondary | ICD-10-CM | POA: Diagnosis not present

## 2020-02-15 DIAGNOSIS — E1122 Type 2 diabetes mellitus with diabetic chronic kidney disease: Secondary | ICD-10-CM | POA: Diagnosis not present

## 2020-02-15 DIAGNOSIS — Z794 Long term (current) use of insulin: Secondary | ICD-10-CM | POA: Diagnosis not present

## 2020-02-15 DIAGNOSIS — Z0000 Encounter for general adult medical examination without abnormal findings: Secondary | ICD-10-CM | POA: Diagnosis not present

## 2020-02-15 DIAGNOSIS — E7800 Pure hypercholesterolemia, unspecified: Secondary | ICD-10-CM | POA: Diagnosis not present

## 2020-02-15 DIAGNOSIS — I129 Hypertensive chronic kidney disease with stage 1 through stage 4 chronic kidney disease, or unspecified chronic kidney disease: Secondary | ICD-10-CM | POA: Diagnosis not present

## 2020-02-15 DIAGNOSIS — N184 Chronic kidney disease, stage 4 (severe): Secondary | ICD-10-CM | POA: Diagnosis not present

## 2020-02-15 DIAGNOSIS — Z23 Encounter for immunization: Secondary | ICD-10-CM | POA: Diagnosis not present

## 2020-06-12 DIAGNOSIS — I10 Essential (primary) hypertension: Secondary | ICD-10-CM | POA: Diagnosis not present

## 2020-06-12 DIAGNOSIS — I25118 Atherosclerotic heart disease of native coronary artery with other forms of angina pectoris: Secondary | ICD-10-CM | POA: Diagnosis not present

## 2020-06-12 DIAGNOSIS — E7800 Pure hypercholesterolemia, unspecified: Secondary | ICD-10-CM | POA: Diagnosis not present

## 2020-06-12 DIAGNOSIS — Z794 Long term (current) use of insulin: Secondary | ICD-10-CM | POA: Diagnosis not present

## 2020-06-12 DIAGNOSIS — N184 Chronic kidney disease, stage 4 (severe): Secondary | ICD-10-CM | POA: Diagnosis not present

## 2020-06-12 DIAGNOSIS — E1122 Type 2 diabetes mellitus with diabetic chronic kidney disease: Secondary | ICD-10-CM | POA: Diagnosis not present

## 2020-06-12 DIAGNOSIS — E039 Hypothyroidism, unspecified: Secondary | ICD-10-CM | POA: Diagnosis not present

## 2020-06-19 DIAGNOSIS — Z794 Long term (current) use of insulin: Secondary | ICD-10-CM | POA: Diagnosis not present

## 2020-06-19 DIAGNOSIS — E1122 Type 2 diabetes mellitus with diabetic chronic kidney disease: Secondary | ICD-10-CM | POA: Diagnosis not present

## 2020-06-19 DIAGNOSIS — E7800 Pure hypercholesterolemia, unspecified: Secondary | ICD-10-CM | POA: Diagnosis not present

## 2020-06-19 DIAGNOSIS — I25118 Atherosclerotic heart disease of native coronary artery with other forms of angina pectoris: Secondary | ICD-10-CM | POA: Diagnosis not present

## 2020-06-19 DIAGNOSIS — N2581 Secondary hyperparathyroidism of renal origin: Secondary | ICD-10-CM | POA: Diagnosis not present

## 2020-06-19 DIAGNOSIS — N184 Chronic kidney disease, stage 4 (severe): Secondary | ICD-10-CM | POA: Diagnosis not present

## 2020-06-19 DIAGNOSIS — I700 Atherosclerosis of aorta: Secondary | ICD-10-CM | POA: Diagnosis not present

## 2020-06-19 DIAGNOSIS — E039 Hypothyroidism, unspecified: Secondary | ICD-10-CM | POA: Diagnosis not present

## 2020-10-18 DIAGNOSIS — E039 Hypothyroidism, unspecified: Secondary | ICD-10-CM | POA: Diagnosis not present

## 2020-10-18 DIAGNOSIS — I25118 Atherosclerotic heart disease of native coronary artery with other forms of angina pectoris: Secondary | ICD-10-CM | POA: Diagnosis not present

## 2020-10-18 DIAGNOSIS — Z794 Long term (current) use of insulin: Secondary | ICD-10-CM | POA: Diagnosis not present

## 2020-10-18 DIAGNOSIS — E7800 Pure hypercholesterolemia, unspecified: Secondary | ICD-10-CM | POA: Diagnosis not present

## 2020-10-18 DIAGNOSIS — N184 Chronic kidney disease, stage 4 (severe): Secondary | ICD-10-CM | POA: Diagnosis not present

## 2020-10-18 DIAGNOSIS — E1122 Type 2 diabetes mellitus with diabetic chronic kidney disease: Secondary | ICD-10-CM | POA: Diagnosis not present

## 2020-10-25 DIAGNOSIS — I25118 Atherosclerotic heart disease of native coronary artery with other forms of angina pectoris: Secondary | ICD-10-CM | POA: Diagnosis not present

## 2020-10-25 DIAGNOSIS — E7800 Pure hypercholesterolemia, unspecified: Secondary | ICD-10-CM | POA: Diagnosis not present

## 2020-10-25 DIAGNOSIS — I700 Atherosclerosis of aorta: Secondary | ICD-10-CM | POA: Diagnosis not present

## 2020-10-25 DIAGNOSIS — N184 Chronic kidney disease, stage 4 (severe): Secondary | ICD-10-CM | POA: Diagnosis not present

## 2020-10-25 DIAGNOSIS — Z794 Long term (current) use of insulin: Secondary | ICD-10-CM | POA: Diagnosis not present

## 2020-10-25 DIAGNOSIS — N2581 Secondary hyperparathyroidism of renal origin: Secondary | ICD-10-CM | POA: Diagnosis not present

## 2020-10-25 DIAGNOSIS — E039 Hypothyroidism, unspecified: Secondary | ICD-10-CM | POA: Diagnosis not present

## 2020-10-25 DIAGNOSIS — E1122 Type 2 diabetes mellitus with diabetic chronic kidney disease: Secondary | ICD-10-CM | POA: Diagnosis not present

## 2020-12-06 ENCOUNTER — Other Ambulatory Visit: Payer: Self-pay

## 2020-12-06 ENCOUNTER — Ambulatory Visit
Admission: RE | Admit: 2020-12-06 | Discharge: 2020-12-06 | Disposition: A | Discharge status: Home / Self Care | Payer: Medicare HMO | Source: Ambulatory Visit | Attending: Urology | Admitting: Urology

## 2020-12-06 DIAGNOSIS — K449 Diaphragmatic hernia without obstruction or gangrene: Secondary | ICD-10-CM | POA: Diagnosis not present

## 2020-12-06 DIAGNOSIS — N2889 Other specified disorders of kidney and ureter: Secondary | ICD-10-CM | POA: Insufficient documentation

## 2020-12-06 DIAGNOSIS — K862 Cyst of pancreas: Secondary | ICD-10-CM | POA: Diagnosis not present

## 2020-12-06 DIAGNOSIS — N281 Cyst of kidney, acquired: Secondary | ICD-10-CM | POA: Diagnosis not present

## 2020-12-06 IMAGING — MR MR ABDOMEN WO/W CM
18 series · 48 of 48 positions shown · IV contrast (gadavist)
Comparison: [DATE]

CLINICAL DATA: Left lower pole renal mass.  Surveillance.

EXAM:
MRI ABDOMEN WITHOUT AND WITH CONTRAST
TECHNIQUE: Multiplanar multisequence MR imaging of the abdomen was performed
both before and after the administration of intravenous contrast.
CONTRAST:  7.5mL GADAVIST GADOBUTROL 1 MMOL/ML IV SOLN

[Series 3: T2 · coronal · 6.0mm · 1.19mm/px · 2 of 30 slices shown (1 of 2)]
[im 1/30]
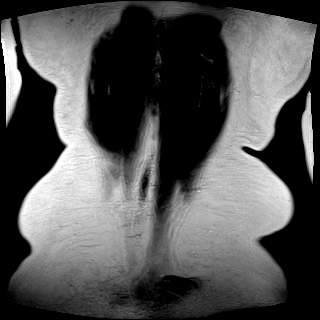
[im 30/30]
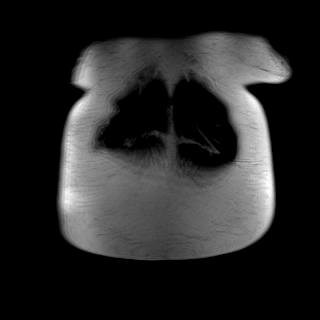

[Series 4: T2 · axial · 6.0mm · 1.19mm/px · z∈[-83,+125]mm · 2 of 30 slices shown (2 of 2)]
[im 1/30]
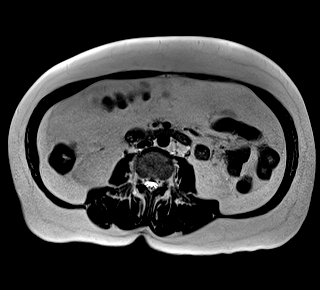
[im 30/30]
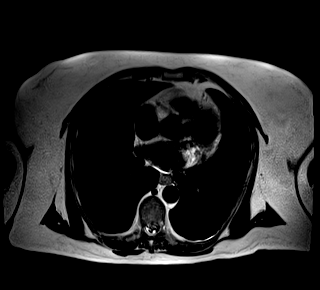

[Series 6: T2 fat-sat · axial · 6.0mm · 1.19mm/px · z∈[-86,+123]mm · 2 of 30 slices shown]
[im 1/30]
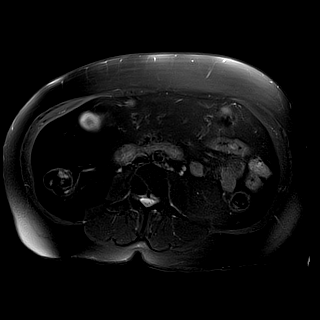
[im 30/30]
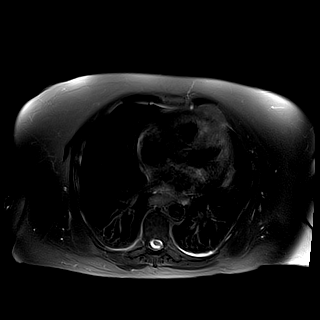

[Series 7: ax dwi_tracew · axial · 6.0mm · 1.42mm/px · z∈[-86,+123]mm · 4 of 90 slices shown]
[im 1/90]
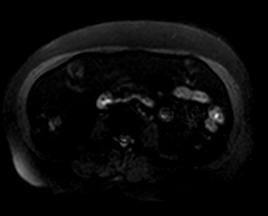
[im 30/90]
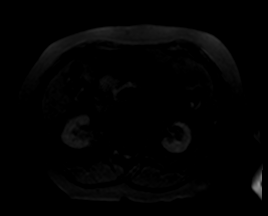
[im 60/90]
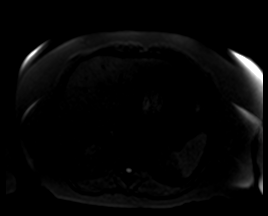
[im 90/90]
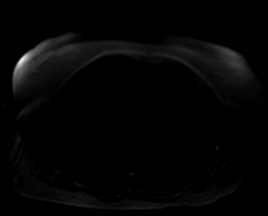

[Series 8: ax dwi_adc · axial · 6.0mm · 1.42mm/px · 1 of 30 slices shown]
[im 1/30]
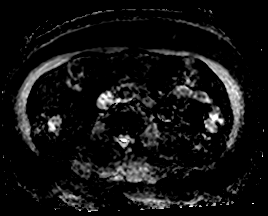

[Series 9: in & out · axial · 3.0mm · 1.19mm/px · z∈[-85,+127]mm · 3 of 72 slices shown (1 of 2)]
[im 1/72]
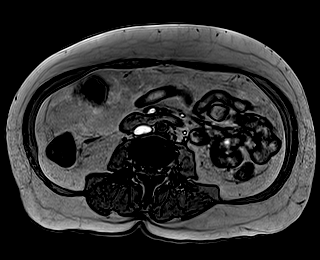
[im 36/72]
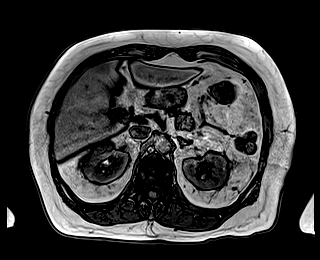
[im 72/72]
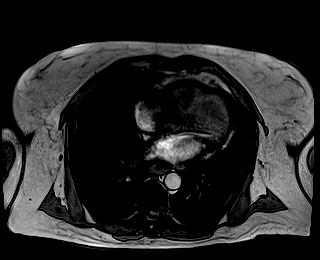

[Series 10: in & out · axial · 3.0mm · 1.19mm/px · z∈[-85,+127]mm · 3 of 72 slices shown (2 of 2)]
[im 1/72]
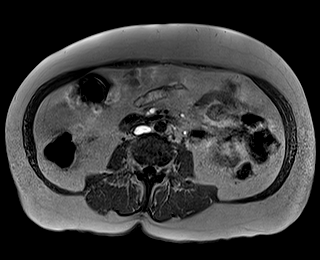
[im 36/72]
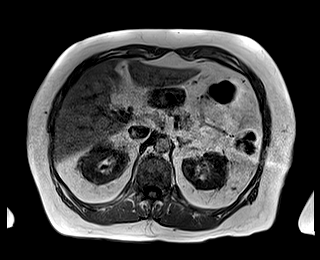
[im 72/72]
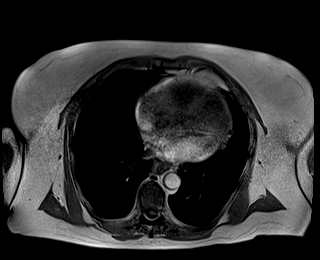

[Series 11: bSSFP · axial · 6.0mm · 0.74mm/px · 1 of 30 slices shown]
[im 1/30]
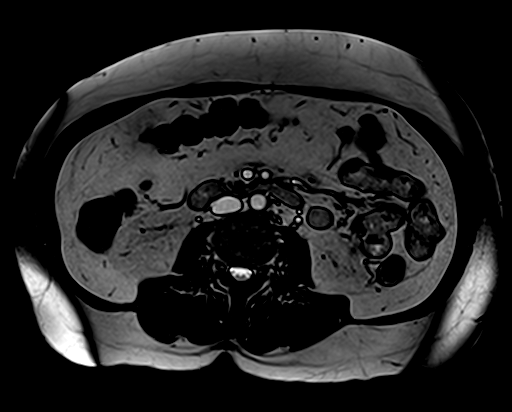

[Series 12: T1 dynamic fat-sat · axial · non-contrast · 3.0mm · 1.19mm/px · z∈[-79,+110]mm · 3 of 64 slices shown (1 of 5)]
[im 1/64]
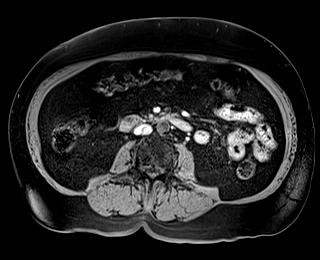
[im 32/64]
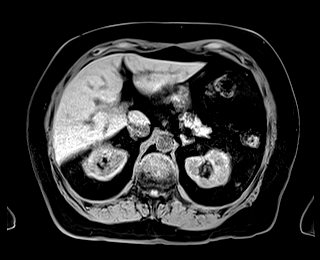
[im 64/64]
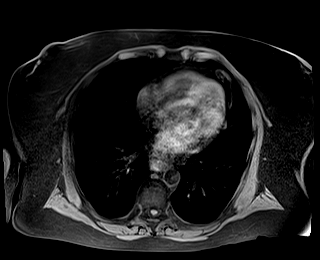

[Series 13: T1 dynamic fat-sat post-contrast · axial · 3.0mm · 1.19mm/px · z∈[-79,+110]mm · 3 of 64 slices shown (1 of 4)]
[im 1/64]
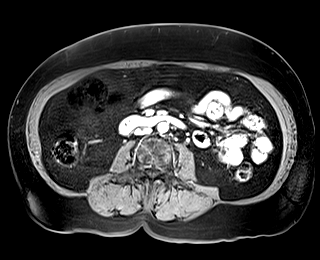
[im 32/64]
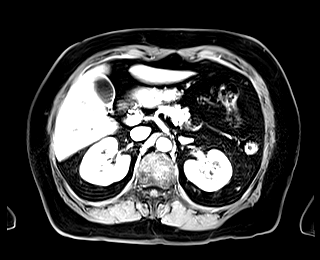
[im 64/64]
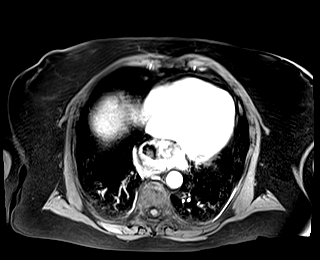

[Series 14: T1 dynamic fat-sat · axial · 3.0mm · 1.19mm/px · z∈[-79,+110]mm · 3 of 64 slices shown (2 of 5)]
[im 1/64]
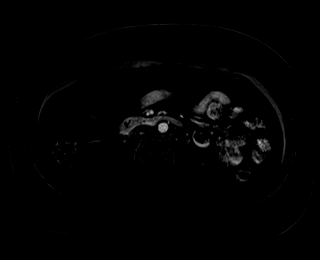
[im 32/64]
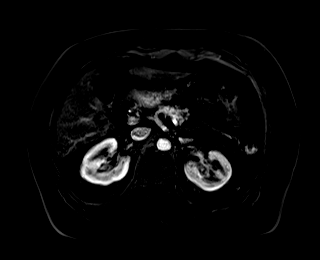
[im 64/64]
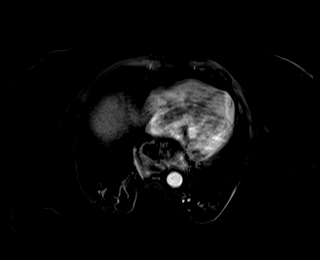

[Series 15: T1 dynamic fat-sat post-contrast · axial · 3.0mm · 1.19mm/px · z∈[-79,+110]mm · 3 of 64 slices shown (2 of 4)]
[im 1/64]
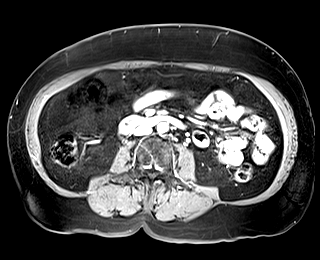
[im 32/64]
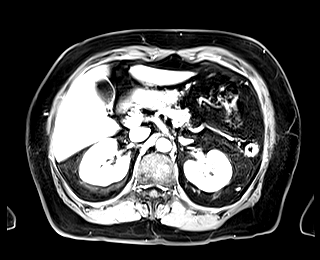
[im 64/64]
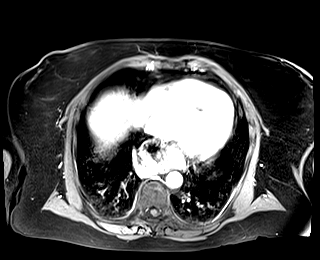

[Series 16: T1 dynamic fat-sat · axial · 3.0mm · 1.19mm/px · z∈[-79,+110]mm · 3 of 64 slices shown (3 of 5)]
[im 1/64]
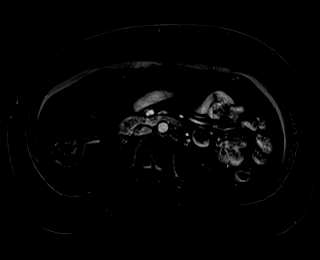
[im 32/64]
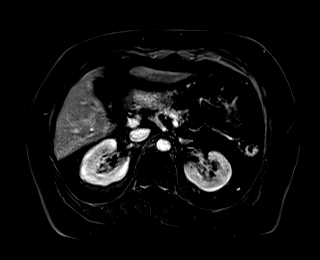
[im 64/64]
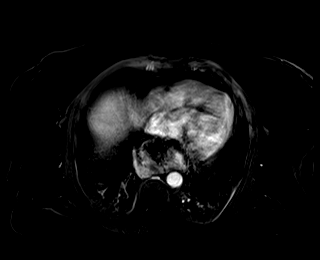

[Series 17: T1 dynamic fat-sat post-contrast · axial · 3.0mm · 1.19mm/px · z∈[-79,+110]mm · 3 of 64 slices shown (3 of 4)]
[im 1/64]
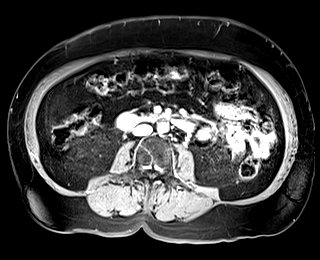
[im 32/64]
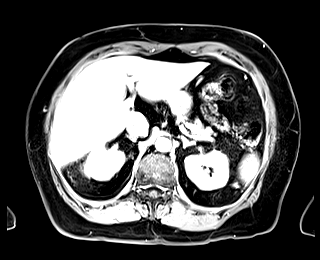
[im 64/64]
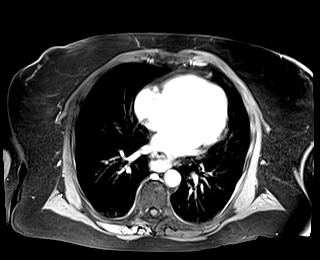

[Series 18: T1 dynamic fat-sat · axial · 3.0mm · 1.19mm/px · z∈[-79,+110]mm · 3 of 64 slices shown (4 of 5)]
[im 1/64]
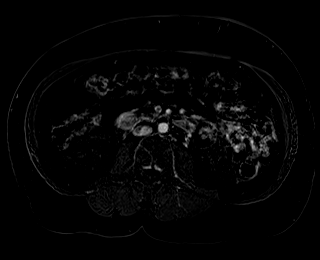
[im 32/64]
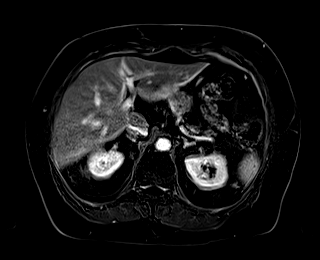
[im 64/64]
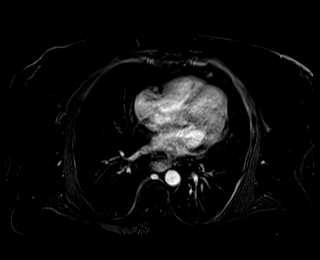

[Series 19: T1 dynamic post-contrast · coronal · 3.0mm · 1.31mm/px · 3 of 72 slices shown]
[im 1/72]
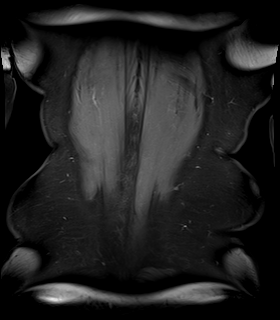
[im 36/72]
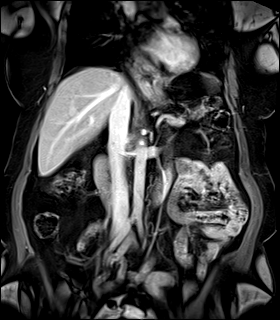
[im 72/72]
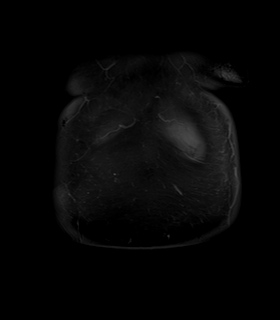

[Series 20: T1 dynamic fat-sat post-contrast · axial · 3.0mm · 1.19mm/px · z∈[-79,+110]mm · 3 of 64 slices shown (4 of 4)]
[im 1/64]
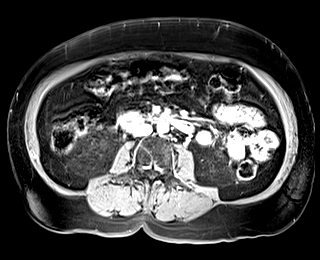
[im 32/64]
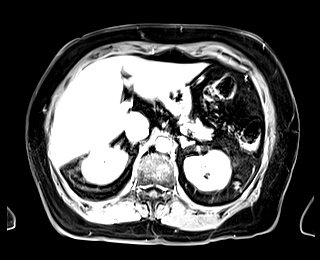
[im 64/64]
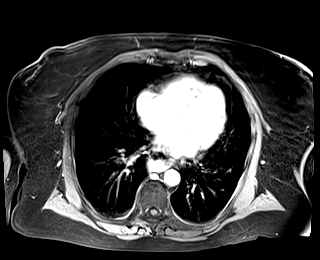

[Series 21: T1 dynamic fat-sat · axial · 3.0mm · 1.19mm/px · z∈[-79,+110]mm · 3 of 64 slices shown (5 of 5)]
[im 1/64]
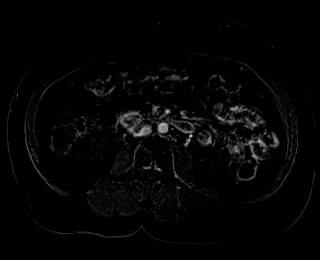
[im 32/64]
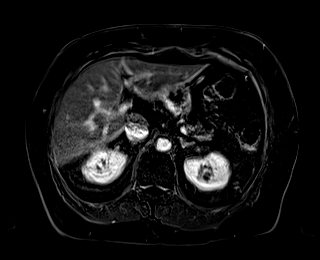
[im 64/64]
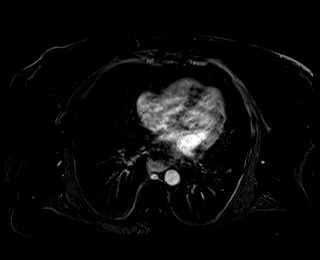

[48 of 48 positions shown; findings below may reference images not displayed]

FINDINGS: Lower chest: Moderate hiatal hernia.

Hepatobiliary: No suspicious focal abnormality within the liver
parenchyma. There is no evidence for gallstones, gallbladder wall
thickening, or pericholecystic fluid. No intrahepatic or
extrahepatic biliary dilation.

Pancreas: Stable 9 mm cystic focus near the junction of the
pancreatic body and tail. Unchanged. No main duct dilatation.

Spleen:  No splenomegaly. No focal mass lesion.

Adrenals/Urinary Tract:  No adrenal nodule or mass.

Right kidney unremarkable.

Stable 7 mm T1 hyperintense nonenhancing subcapsular lesion lateral
interpolar left kidney compatible with Bosniak II cyst.

Stable 1.1 cm simple (Bosniak I) cyst noted posterior aspect of the
lower pole left kidney. Immediately lateral and adjacent to this
cyst is a stable 1.5 cm T1 intermediate signal intensity, T2
intermediate signal intensity lesion showing no discernible
enhancement on postcontrast imaging. This lesion shows no restricted
diffusion. There is some subtle presume misregistration along the
posterior wall of this lesion on today's study that was not evident
on previous subtraction imaging. This lesion remains compatible with
Bosniak II cyst.

The tiny (5 mm) subcapsular focus of T2 hyperintensity described
previously in the posterior interpolar right kidney is stable on
axial T2 image 21 of series 6 today.

There is no new suspicious enhancing lesion in either kidney on
today's exam. No hydronephrosis.

Stomach/Bowel: Moderate to large hiatal hernia with approximately
50% of the stomach contained in the chest. No small bowel or colonic
dilatation within the visualized abdomen.

Vascular/Lymphatic: No abdominal aortic aneurysm. There is no
gastrohepatic or hepatoduodenal ligament lymphadenopathy. No
retroperitoneal or mesenteric lymphadenopathy.

Other:  No intraperitoneal free fluid.

Musculoskeletal: No focal suspicious marrow enhancement within the
visualized bony anatomy.
IMPRESSION: 1. Stable appearance of the 1.5 cm previously characterized Bosniak
II cyst in the posterior aspect of the lower pole left kidney.
Imaging features remain compatible with a Bosniak II lesion today.
No restricted diffusion or discernible enhancement in the lesion.
2. Additional tiny Bosniak I and II cysts in the left kidney are
stable.
3. Moderate to large hiatal hernia.
4. Stable 9 mm cystic focus near the junction of the pancreatic body
and tail. Based on lesion size and patient age follow-up
recommendations are for repeat imaging in 2 years by MRI. This
recommendation follows ACR consensus guidelines: Management of
Incidental Pancreatic Cysts: A White Paper of the ACR Incidental
Findings Committee. [HOSPITAL] [9A];[DATE].

## 2020-12-06 MED ORDER — GADOBUTROL 1 MMOL/ML IV SOLN
8.0000 mL | Freq: Once | INTRAVENOUS | Status: AC | PRN
  Administered 2020-12-06: 7.5 mL via INTRAVENOUS

## 2020-12-10 ENCOUNTER — Ambulatory Visit: Payer: Self-pay | Admitting: Urology

## 2020-12-12 ENCOUNTER — Other Ambulatory Visit: Payer: Self-pay

## 2020-12-12 ENCOUNTER — Encounter: Payer: Self-pay | Admitting: Urology

## 2020-12-12 ENCOUNTER — Ambulatory Visit: Payer: Medicare HMO | Admitting: Urology

## 2020-12-12 VITALS — BP 110/57 | HR 79 | Ht 65.0 in | Wt 167.6 lb

## 2020-12-12 DIAGNOSIS — N2889 Other specified disorders of kidney and ureter: Secondary | ICD-10-CM | POA: Diagnosis not present

## 2020-12-12 NOTE — Progress Notes (Signed)
   12/12/2020 9:33 AM   Gery Pray 1945/09/26 [id number]  Reason for visit: Follow up small left renal mass   HPI: I saw Ms. Pugmire and her husband in urology clinic today for follow-up of her small 1 cm left renal mass.  She has a number of comorbidities including multiple hip replacements, diabetes, history of stroke, CKD and baseline EGFR of 30.  She had a CT scan in July 2019 that showed a 1 cm heterogenous possibly enhancing lesion at the lower pole of the left kidney.  I had recommended active surveillance with a follow-up renal ultrasound in July 2020 which suggested possible enlargement of the lesion to 2.4 cm.  This was followed by an MRI of the abdomen in June 2020 which suggested a 1.3 cm unchanged lesion in the left lower pole that had not enlarged from prior.  There is no lymphadenopathy or metastatic disease.   With her comorbidities, she opted for ongoing active surveillance with a repeat MRI in a year.  MRI last year showed unchanged size of a 1.1 cm cyst, and this was felt to be more compatible with a Bosniak 2 cyst as opposed to malignancy.  She denies any changes in her health over the last year.  I personally viewed and interpreted the MRI dated 12/06/2020 that shows stable appearance of a 1.5 cm Bosniak 2 cyst in the lower pole of the left kidney, with no significant enhancement worrisome for malignancy.  There is also a stable 9 mm cystic focus near the pancreas and repeat MRI recommended in 2 years.  We reviewed these results at length.  Based on her multiple stable MRIs and CTs over the last 3 years, I am comfortable discontinuing any surveillance at this time per the guidelines regarding Bosniak 2 cysts.  She denies any gross hematuria or other urinary complaints.  Follow-up with urology as needed.  Consider repeat MRI abdomen with PCP in 2 years for 9 mm cystic lesion near the pancreas per radiology report  [place] [place], [place] [address], [place], [place] [address] [phone]

## 2021-01-21 DIAGNOSIS — Z0100 Encounter for examination of eyes and vision without abnormal findings: Secondary | ICD-10-CM | POA: Diagnosis not present

## 2021-01-21 DIAGNOSIS — H26491 Other secondary cataract, right eye: Secondary | ICD-10-CM | POA: Diagnosis not present

## 2021-01-21 DIAGNOSIS — H35371 Puckering of macula, right eye: Secondary | ICD-10-CM | POA: Diagnosis not present

## 2021-02-04 DIAGNOSIS — E11319 Type 2 diabetes mellitus with unspecified diabetic retinopathy without macular edema: Secondary | ICD-10-CM | POA: Diagnosis not present

## 2021-02-20 DIAGNOSIS — E1122 Type 2 diabetes mellitus with diabetic chronic kidney disease: Secondary | ICD-10-CM | POA: Diagnosis not present

## 2021-02-20 DIAGNOSIS — I25118 Atherosclerotic heart disease of native coronary artery with other forms of angina pectoris: Secondary | ICD-10-CM | POA: Diagnosis not present

## 2021-02-20 DIAGNOSIS — N184 Chronic kidney disease, stage 4 (severe): Secondary | ICD-10-CM | POA: Diagnosis not present

## 2021-02-20 DIAGNOSIS — Z794 Long term (current) use of insulin: Secondary | ICD-10-CM | POA: Diagnosis not present

## 2021-02-20 DIAGNOSIS — E039 Hypothyroidism, unspecified: Secondary | ICD-10-CM | POA: Diagnosis not present

## 2021-02-22 DIAGNOSIS — E875 Hyperkalemia: Secondary | ICD-10-CM | POA: Diagnosis not present

## 2021-02-25 DIAGNOSIS — E875 Hyperkalemia: Secondary | ICD-10-CM | POA: Diagnosis not present

## 2021-02-25 DIAGNOSIS — I10 Essential (primary) hypertension: Secondary | ICD-10-CM | POA: Diagnosis not present

## 2021-02-27 DIAGNOSIS — E1122 Type 2 diabetes mellitus with diabetic chronic kidney disease: Secondary | ICD-10-CM | POA: Diagnosis not present

## 2021-02-27 DIAGNOSIS — Z794 Long term (current) use of insulin: Secondary | ICD-10-CM | POA: Diagnosis not present

## 2021-02-27 DIAGNOSIS — E039 Hypothyroidism, unspecified: Secondary | ICD-10-CM | POA: Diagnosis not present

## 2021-02-27 DIAGNOSIS — N184 Chronic kidney disease, stage 4 (severe): Secondary | ICD-10-CM | POA: Diagnosis not present

## 2021-02-27 DIAGNOSIS — I25118 Atherosclerotic heart disease of native coronary artery with other forms of angina pectoris: Secondary | ICD-10-CM | POA: Diagnosis not present

## 2021-02-27 DIAGNOSIS — Z1389 Encounter for screening for other disorder: Secondary | ICD-10-CM | POA: Diagnosis not present

## 2021-02-27 DIAGNOSIS — Z0000 Encounter for general adult medical examination without abnormal findings: Secondary | ICD-10-CM | POA: Diagnosis not present

## 2021-02-27 DIAGNOSIS — I700 Atherosclerosis of aorta: Secondary | ICD-10-CM | POA: Diagnosis not present

## 2021-03-16 ENCOUNTER — Inpatient Hospital Stay
Admission: EM | Admit: 2021-03-16 | Discharge: 2021-03-18 | DRG: 281 | Disposition: A | Discharge status: Other Acute Inpatient Hospital | Payer: Medicare HMO | Attending: Family Medicine | Admitting: Family Medicine

## 2021-03-16 DIAGNOSIS — T84018S Broken internal joint prosthesis, other site, sequela: Secondary | ICD-10-CM

## 2021-03-16 DIAGNOSIS — N2581 Secondary hyperparathyroidism of renal origin: Secondary | ICD-10-CM | POA: Diagnosis present

## 2021-03-16 DIAGNOSIS — E860 Dehydration: Secondary | ICD-10-CM | POA: Diagnosis present

## 2021-03-16 DIAGNOSIS — R072 Precordial pain: Secondary | ICD-10-CM

## 2021-03-16 DIAGNOSIS — J45909 Unspecified asthma, uncomplicated: Secondary | ICD-10-CM | POA: Diagnosis present

## 2021-03-16 DIAGNOSIS — E878 Other disorders of electrolyte and fluid balance, not elsewhere classified: Secondary | ICD-10-CM | POA: Diagnosis present

## 2021-03-16 DIAGNOSIS — Z96649 Presence of unspecified artificial hip joint: Secondary | ICD-10-CM

## 2021-03-16 DIAGNOSIS — I509 Heart failure, unspecified: Secondary | ICD-10-CM | POA: Diagnosis not present

## 2021-03-16 DIAGNOSIS — R739 Hyperglycemia, unspecified: Secondary | ICD-10-CM | POA: Diagnosis not present

## 2021-03-16 DIAGNOSIS — N184 Chronic kidney disease, stage 4 (severe): Secondary | ICD-10-CM | POA: Diagnosis present

## 2021-03-16 DIAGNOSIS — I214 Non-ST elevation (NSTEMI) myocardial infarction: Principal | ICD-10-CM | POA: Diagnosis present

## 2021-03-16 DIAGNOSIS — E039 Hypothyroidism, unspecified: Secondary | ICD-10-CM | POA: Diagnosis not present

## 2021-03-16 DIAGNOSIS — J90 Pleural effusion, not elsewhere classified: Secondary | ICD-10-CM | POA: Diagnosis not present

## 2021-03-16 DIAGNOSIS — I25118 Atherosclerotic heart disease of native coronary artery with other forms of angina pectoris: Secondary | ICD-10-CM | POA: Diagnosis not present

## 2021-03-16 DIAGNOSIS — R350 Frequency of micturition: Secondary | ICD-10-CM | POA: Diagnosis present

## 2021-03-16 DIAGNOSIS — E1165 Type 2 diabetes mellitus with hyperglycemia: Secondary | ICD-10-CM | POA: Diagnosis present

## 2021-03-16 DIAGNOSIS — Z8673 Personal history of transient ischemic attack (TIA), and cerebral infarction without residual deficits: Secondary | ICD-10-CM | POA: Diagnosis not present

## 2021-03-16 DIAGNOSIS — I10 Essential (primary) hypertension: Secondary | ICD-10-CM | POA: Diagnosis not present

## 2021-03-16 DIAGNOSIS — D72828 Other elevated white blood cell count: Secondary | ICD-10-CM | POA: Diagnosis present

## 2021-03-16 DIAGNOSIS — Z794 Long term (current) use of insulin: Secondary | ICD-10-CM | POA: Diagnosis not present

## 2021-03-16 DIAGNOSIS — I2510 Atherosclerotic heart disease of native coronary artery without angina pectoris: Secondary | ICD-10-CM | POA: Diagnosis present

## 2021-03-16 DIAGNOSIS — E871 Hypo-osmolality and hyponatremia: Secondary | ICD-10-CM | POA: Diagnosis not present

## 2021-03-16 DIAGNOSIS — D631 Anemia in chronic kidney disease: Secondary | ICD-10-CM | POA: Diagnosis not present

## 2021-03-16 DIAGNOSIS — Z20822 Contact with and (suspected) exposure to covid-19: Secondary | ICD-10-CM | POA: Diagnosis not present

## 2021-03-16 DIAGNOSIS — Z79890 Hormone replacement therapy (postmenopausal): Secondary | ICD-10-CM

## 2021-03-16 DIAGNOSIS — J984 Other disorders of lung: Secondary | ICD-10-CM | POA: Diagnosis not present

## 2021-03-16 DIAGNOSIS — D649 Anemia, unspecified: Secondary | ICD-10-CM | POA: Diagnosis present

## 2021-03-16 DIAGNOSIS — Z7982 Long term (current) use of aspirin: Secondary | ICD-10-CM | POA: Diagnosis not present

## 2021-03-16 DIAGNOSIS — E785 Hyperlipidemia, unspecified: Secondary | ICD-10-CM | POA: Diagnosis present

## 2021-03-16 DIAGNOSIS — I252 Old myocardial infarction: Secondary | ICD-10-CM | POA: Diagnosis not present

## 2021-03-16 DIAGNOSIS — I25119 Atherosclerotic heart disease of native coronary artery with unspecified angina pectoris: Secondary | ICD-10-CM

## 2021-03-16 DIAGNOSIS — J9811 Atelectasis: Secondary | ICD-10-CM | POA: Diagnosis not present

## 2021-03-16 DIAGNOSIS — I130 Hypertensive heart and chronic kidney disease with heart failure and stage 1 through stage 4 chronic kidney disease, or unspecified chronic kidney disease: Secondary | ICD-10-CM | POA: Diagnosis present

## 2021-03-16 DIAGNOSIS — R531 Weakness: Secondary | ICD-10-CM | POA: Diagnosis not present

## 2021-03-16 DIAGNOSIS — R5383 Other fatigue: Secondary | ICD-10-CM | POA: Diagnosis not present

## 2021-03-16 DIAGNOSIS — E1122 Type 2 diabetes mellitus with diabetic chronic kidney disease: Secondary | ICD-10-CM | POA: Diagnosis not present

## 2021-03-16 DIAGNOSIS — R918 Other nonspecific abnormal finding of lung field: Secondary | ICD-10-CM | POA: Diagnosis not present

## 2021-03-16 DIAGNOSIS — N179 Acute kidney failure, unspecified: Secondary | ICD-10-CM | POA: Diagnosis present

## 2021-03-16 DIAGNOSIS — R0789 Other chest pain: Secondary | ICD-10-CM | POA: Diagnosis present

## 2021-03-16 DIAGNOSIS — R0902 Hypoxemia: Secondary | ICD-10-CM | POA: Diagnosis not present

## 2021-03-16 DIAGNOSIS — Z951 Presence of aortocoronary bypass graft: Secondary | ICD-10-CM | POA: Diagnosis not present

## 2021-03-16 DIAGNOSIS — R6510 Systemic inflammatory response syndrome (SIRS) of non-infectious origin without acute organ dysfunction: Secondary | ICD-10-CM | POA: Diagnosis present

## 2021-03-16 DIAGNOSIS — I700 Atherosclerosis of aorta: Secondary | ICD-10-CM | POA: Diagnosis not present

## 2021-03-16 DIAGNOSIS — R079 Chest pain, unspecified: Secondary | ICD-10-CM

## 2021-03-16 DIAGNOSIS — I5021 Acute systolic (congestive) heart failure: Secondary | ICD-10-CM | POA: Diagnosis not present

## 2021-03-16 DIAGNOSIS — D72829 Elevated white blood cell count, unspecified: Secondary | ICD-10-CM | POA: Diagnosis present

## 2021-03-16 DIAGNOSIS — N182 Chronic kidney disease, stage 2 (mild): Secondary | ICD-10-CM | POA: Diagnosis present

## 2021-03-16 LAB — CBC WITH DIFFERENTIAL/PLATELET
Abs Immature Granulocytes: 0.07 10*3/uL (ref 0.00–0.07)
Basophils Absolute: 0.1 10*3/uL (ref 0.0–0.1)
Basophils Relative: 0 %
Eosinophils Absolute: 0 10*3/uL (ref 0.0–0.5)
Eosinophils Relative: 0 %
HCT: 34.2 % — ABNORMAL LOW (ref 36.0–46.0)
Hemoglobin: 11.4 g/dL — ABNORMAL LOW (ref 12.0–15.0)
Immature Granulocytes: 0 %
Lymphocytes Relative: 6 %
Lymphs Abs: 1.1 10*3/uL (ref 0.7–4.0)
MCH: 31.9 pg (ref 26.0–34.0)
MCHC: 33.3 g/dL (ref 30.0–36.0)
MCV: 95.8 fL (ref 80.0–100.0)
Monocytes Absolute: 2 10*3/uL — ABNORMAL HIGH (ref 0.1–1.0)
Monocytes Relative: 12 %
Neutro Abs: 13.7 10*3/uL — ABNORMAL HIGH (ref 1.7–7.7)
Neutrophils Relative %: 82 %
Platelets: 163 10*3/uL (ref 150–400)
RBC: 3.57 MIL/uL — ABNORMAL LOW (ref 3.87–5.11)
RDW: 11.9 % (ref 11.5–15.5)
WBC: 16.9 10*3/uL — ABNORMAL HIGH (ref 4.0–10.5)
nRBC: 0 % (ref 0.0–0.2)

## 2021-03-16 LAB — BASIC METABOLIC PANEL
Anion gap: 8 (ref 5–15)
BUN: 46 mg/dL — ABNORMAL HIGH (ref 8–23)
CO2: 25 mmol/L (ref 22–32)
Calcium: 9.5 mg/dL (ref 8.9–10.3)
Chloride: 96 mmol/L — ABNORMAL LOW (ref 98–111)
Creatinine, Ser: 1.98 mg/dL — ABNORMAL HIGH (ref 0.44–1.00)
GFR, Estimated: 26 mL/min — ABNORMAL LOW (ref >60)
Glucose, Bld: 680 mg/dL (ref 70–99)
Potassium: 4.8 mmol/L (ref 3.5–5.1)
Sodium: 129 mmol/L — ABNORMAL LOW (ref 135–145)

## 2021-03-16 LAB — CBG MONITORING, ED: Glucose-Capillary: >600 mg/dL (ref 70–99)

## 2021-03-16 LAB — URINALYSIS, COMPLETE (UACMP) WITH MICROSCOPIC
Bilirubin Urine: NEGATIVE
Glucose, UA: >=500 mg/dL — AB
Ketones, ur: NEGATIVE mg/dL
Nitrite: POSITIVE — AB
Protein, ur: NEGATIVE mg/dL
Specific Gravity, Urine: 1.025 (ref 1.005–1.030)
pH: 5 (ref 5.0–8.0)

## 2021-03-16 LAB — BLOOD GAS, VENOUS
Acid-Base Excess: 3.1 mmol/L — ABNORMAL HIGH (ref 0.0–2.0)
Bicarbonate: 30.2 mmol/L — ABNORMAL HIGH (ref 20.0–28.0)
O2 Saturation: 43.2 %
Patient temperature: 37
pCO2, Ven: 56 mmHg (ref 44.0–60.0)
pH, Ven: 7.34 (ref 7.250–7.430)
pO2, Ven: <31 mmHg — CL (ref 32.0–45.0)

## 2021-03-16 MED ORDER — INSULIN ASPART 100 UNIT/ML IJ SOLN: 0.0000 [IU] | Freq: Three times a day (TID) | INTRAMUSCULAR | Status: DC

## 2021-03-16 MED ORDER — LEVOTHYROXINE SODIUM 88 MCG PO TABS
88.0000 ug | ORAL_TABLET | Freq: Every day | ORAL | Status: DC
  Administered 2021-03-17 – 2021-03-18 (×2): 88 ug via ORAL
  Filled 2021-03-16 (×3): qty 1

## 2021-03-16 MED ORDER — ASPIRIN EC 81 MG PO TBEC
81.0000 mg | DELAYED_RELEASE_TABLET | Freq: Every day | ORAL | Status: DC
  Administered 2021-03-17: 81 mg via ORAL
  Filled 2021-03-16: qty 1

## 2021-03-16 MED ORDER — SODIUM CHLORIDE 0.9 % IV SOLN: INTRAVENOUS | Status: DC

## 2021-03-16 MED ORDER — HEPARIN SODIUM (PORCINE) 5000 UNIT/ML IJ SOLN: 5000.0000 [IU] | Freq: Three times a day (TID) | INTRAMUSCULAR | Status: DC

## 2021-03-16 MED ORDER — SODIUM CHLORIDE 0.9 % IV SOLN: Freq: Once | INTRAVENOUS | Status: AC

## 2021-03-16 MED ORDER — HYDRALAZINE HCL 20 MG/ML IJ SOLN
10.0000 mg | Freq: Four times a day (QID) | INTRAMUSCULAR | Status: DC
  Filled 2021-03-16: qty 1

## 2021-03-16 MED ORDER — INSULIN ASPART 100 UNIT/ML IJ SOLN
10.0000 [IU] | Freq: Once | INTRAMUSCULAR | Status: AC
  Administered 2021-03-16: 10 [IU] via INTRAVENOUS
  Filled 2021-03-16: qty 1

## 2021-03-16 MED ORDER — SODIUM CHLORIDE 0.9 % IV BOLUS
1000.0000 mL | Freq: Once | INTRAVENOUS | Status: AC
  Administered 2021-03-16: 1000 mL via INTRAVENOUS

## 2021-03-16 NOTE — ED Notes (Signed)
Assisted patient to the toilet.

## 2021-03-16 NOTE — ED Triage Notes (Signed)
Pt arrives via ems from home. Reports her glucometer has been reading HI since Thursday. Reports fatigue, generalized weakness, increased urinary output and increased thirst.

## 2021-03-16 NOTE — H&P (Addendum)
History and Physical    Mckenzie Wright [id number] DOB: 02-26-1946 DOA: 03/16/2021  PCP: Kirk Ruths, MD    Patient coming from:  Home    Chief Complaint:  Hyperglycemia    HPI: Mckenzie Wright is a 75 y.o. female seen in ed with complaints of hyperglycemia since her diabetes medication change 2 days ago by her PCP.  Patient also reports polyuria polydipsia. Pt went to PCP last week and was taken off insulin and changed to a pen that made her sugars uncontrolled. Pt started the chest pain midsternal when she changed to new insulin. C/P is 10/10 , started since Thursday , intermittent , no alleviating or aggravating factors, pt is visibly dyspneic, pt has not tried any remedies for her chest pain, pt has sob, no nausea or vomiting, NR, worse with activity per patient. No travel, but pt has had four hip surgeries last one was may 2018. Pt does see nephrology- Dr.K.  Pt has past medical history of diabetes mellitus type 2, asthma, hypertension, history of stroke, heart disease, hyperlipidemia, chronic kidney disease, secondary hyperparathyroidism.  ED Course:  Vitals:   03/16/21 2330 03/17/21 0000 03/17/21 0030 03/17/21 0052  BP: 114/62 126/64 121/62 117/62  Pulse:    88  Resp: (!) 26 (!) 24 (!) 27 (!) 26  Temp:      SpO2: 96%   95%  In the emergency room patient is alert awake oriented afebrile oxygenating 94% on room air.  Patient does meet SIRS criteria with a heart rate of 90s respirations of 23 and a white count. Venous blood gas shows normal pH, PCO2 of 56, PO2 of 31, CMP shows hyponatremia of 129 hypochloremia of 96 and hyperglycemia with a glucose of 680, creatinine of 1.98, trend as below. Lab Results  Component Value Date   CREATININE 1.98 (H) 03/16/2021   CREATININE 1.50 (H) 11/24/2018   CREATININE 1.70 (H) 01/18/2018  CBC shows leukocytosis with a white count of 16.9, hemoglobin of 11.4 and platelet count of 163. In Ed pt received IVF and insulin, TNI and EKG  ordered.  On my eval pt is uncomfortable and dyspneic and is c/o chest pain.    Review of Systems:  Review of Systems  Constitutional: Negative.   HENT: Negative.    Eyes: Negative.   Respiratory:  Positive for shortness of breath.   Cardiovascular:  Positive for chest pain.  Gastrointestinal: Negative.   Genitourinary: Negative.   Musculoskeletal: Negative.   Skin: Negative.   Neurological: Negative.   Endo/Heme/Allergies: Negative.   Psychiatric/Behavioral: Negative.      Past Medical History:  Diagnosis Date   Asthma    Diabetes ([place])    Hypertension    Stroke [facility])     Past Surgical History:  Procedure Laterality Date   TOTAL HIP ARTHROPLASTY  2000   TOTAL HIP ARTHROPLASTY  2002   TOTAL HIP ARTHROPLASTY  2017   TOTAL HIP ARTHROPLASTY  2018     reports that she has never smoked. She has never used smokeless tobacco. She reports that she does not currently use alcohol. She reports that she does not use drugs.  No Known Allergies  Family History  Problem Relation Age of Onset   Breast cancer Neg Hx     Prior to Admission medications   Medication Sig Start Date End Date Taking? Authorizing Provider  aspirin EC 81 MG tablet Take 81 mg by mouth daily.    [provider]  Calcium Carbonate-Vitamin D  600-200 MG-UNIT TABS Take by mouth.    [provider]  insulin NPH Human (NOVOLIN N) 100 UNIT/ML injection INJECT 45 UNITS SUBCUTANEOUSLY IN THE MORNING WITH BREAKFAST AND 28 UNITS IN THE EVENING WITH DINNER 10/06/18   [provider]  levothyroxine (SYNTHROID) 88 MCG tablet Take by mouth. 11/29/19 11/28/20  [provider]  levothyroxine (SYNTHROID) 88 MCG tablet Take by mouth. 06/19/20   [provider]  lisinopril-hydrochlorothiazide (ZESTORETIC) 20-12.5 MG tablet Take 1 tablet by mouth daily. 11/29/19   [provider]  lovastatin (MEVACOR) 40 MG tablet Take 40 mg by mouth at bedtime.    [provider]   lovastatin (MEVACOR) 40 MG tablet Take by mouth. 06/19/20   [provider]  torsemide (DEMADEX) 20 MG tablet TAKE 1 TABLET BY MOUTH ONCE DAILY IN THE MORNING AS NEEDED FOR EDEMA IN LEGS 05/26/18   [provider]  vitamin B-12 (CYANOCOBALAMIN) 1000 MCG tablet Take 1,000 mcg by mouth daily.    [provider]    Physical Exam: Vitals:   03/16/21 2330 03/17/21 0000 03/17/21 0030 03/17/21 0052  BP: 114/62 126/64 121/62 117/62  Pulse:    88  Resp: (!) 26 (!) 24 (!) 27 (!) 26  Temp:      SpO2: 96%   95%   Physical Exam Vitals and nursing note reviewed.  Constitutional:      General: She is in acute distress.     Appearance: She is not ill-appearing.  HENT:     Head: Normocephalic and atraumatic.     Right Ear: External ear normal.     Left Ear: External ear normal.     Nose: Nose normal.     Mouth/Throat:     Mouth: Mucous membranes are dry.  Eyes:     Extraocular Movements: Extraocular movements intact.     Pupils: Pupils are equal, round, and reactive to light.  Neck:     Vascular: No carotid bruit.  Cardiovascular:     Rate and Rhythm: Normal rate and regular rhythm.     Pulses: Normal pulses.     Heart sounds: Normal heart sounds.  Pulmonary:     Effort: Pulmonary effort is normal.     Breath sounds: Normal breath sounds.  Abdominal:     General: Bowel sounds are normal. There is no distension.     Palpations: Abdomen is soft. There is no mass.     Tenderness: There is no abdominal tenderness. There is no guarding.     Hernia: No hernia is present.  Musculoskeletal:     Right lower leg: No edema.     Left lower leg: No edema.  Skin:    General: Skin is warm.  Neurological:     General: No focal deficit present.     Mental Status: She is alert and oriented to person, place, and time.  Psychiatric:        Mood and Affect: Mood normal.        Behavior: Behavior normal.   Labs on Admission: I have personally reviewed following labs and  imaging studies  No results for input(s): CKTOTAL, CKMB, TROPONINI in the last 72 hours. Lab Results  Component Value Date   WBC 16.9 (H) 03/16/2021   HGB 11.4 (L) 03/16/2021   HCT 34.2 (L) 03/16/2021   MCV 95.8 03/16/2021   PLT 163 03/16/2021    Recent Labs  Lab 03/16/21 2200  NA 129*  K 4.8  CL 96*  CO2  25  BUN 46*  CREATININE 1.98*  CALCIUM 9.5  GLUCOSE 680*    COVID-19 Labs No results found for: SARSCOV2NAA Pending   Radiological Exams on Admission: DG Chest 1 View  Result Date: 03/17/2021 CLINICAL DATA:  Weakness and fatigue. EXAM: CHEST  1 VIEW COMPARISON:  May 07, 2009 FINDINGS: Mild, diffusely increased interstitial lung markings are seen. There is no evidence of a pleural effusion or pneumothorax. The heart size and mediastinal contours are within normal limits. There is mild calcification of the aortic arch. Degenerative changes are noted throughout the thoracic spine. IMPRESSION: Findings suggestive of mild interstitial edema. Electronically Signed   By: Virgina Norfolk M.D.   On: 03/17/2021 00:33   Chest xray ordered pending.  EKG: Independently reviewed.  Pending.   Assessment/Plan Principal Problem:   Hyperglycemia due to diabetes mellitus (HCC) Active Problems:   Type 2 diabetes mellitus with stage 4 chronic kidney disease, with long-term current use of insulin (HCC)   Chest pain   CAD (coronary artery disease)   Failed total hip arthroplasty (HCC)   Leucocytosis   SIRS without infection or organ dysfunction (HCC)   Acquired hypothyroidism   Anemia   Essential hypertension   Acute-on-chronic kidney injury ([place])   Hyperlipidemia   Hyperglycemia due to diabetes mellitus type 2/diabetes mellitus type 2: Patient found to have hyperglycemia with blood sugars in the 600s to 700s upon arrival to the emergency room patient given 1 L of normal saline IV fluid bolus along with 10 units of insulin IV. We will stop patient's home regimen with NPH  obtain an A1c and continue patient on IV fluid rehydration along with glycemic protocol and a consistent carb diet.  Chest pain/CAD/history of failed arthroplasties x 4:  Pt reports midsternal chest pain since her medication change. Differentials of chest pain in this patient include pneumonia, pulmonary embolism, ACS. Due to abnormal kidney function and acute worsening of kidney function CT angio chest not performed, D-dimer and VQ scan ordered.  We will also obtain a stat chest x-ray, 2D echocardiogram and a Lexiscan stress test as patient is at high risk due to her CAD history. As needed nitroglycerin, morphine, continue aspirin, patient started on Crestor. EKG and chest x-ray ordered.  Less likely to I suspect that this chest pain may be related to any biliary etiology we will obtain a LFTs/GGT.  If patient's troponin is elevated we will proceed with ACS protocol and heparin drip. 1:03 AM: EKG ADDENDUM: -Sinus rhythm 89 -Q-wave in lead III -T wave inversions in lead II, III, aVF new from 2015 EKG. -T wave inversions also in lead V3, V4, V5, V6 new from 2015 EKG. Cardiology Dr. Erin Fulling is informed and consult requested.    Leukocytosis/SIRS without infection or organ dysfunction identified: Patient meets SIRS criteria and has leukocytosis which could be related to her stress and hyperglycemia.  Patient also could have pneumonia or an infectious cause of leukocytosis. I have ordered chest x-ray which is pending, we will also order a procalcitonin level.  Acquired hypothyroidism: Free T4/TSH level. Continue patient on her levothyroxine.  Anemia: Patient reports that she has a 30-year history of anemia, she has not seen a hematologist and does not specifically know why she has anemia.  Patient also reports that she has had multiple upper and lower GI evaluations.  Differentials also include anemia of chronic kidney disease.  We will monitor patient's H&H start PPI therapy empirically for  any underlying esophagitis or peptic ulcer disease in  light of acute and current clinical chest pain.  Essential hypertension: Blood pressure 117/62, pulse 88, temperature 99.2 F (37.3 C), resp. rate (!) 26, SpO2 95 %. Blood pressure is well controlled with patient's current regimen she is currently on ACE inhibitor and HCTZ in addition to Demadex.  I have discussed with spouse at bedside about need for stopping ACE inhibitor and diuretic therapy secondary to acute kidney injury especially with her baseline chronic kidney disease he verbalized understanding and I answered all questions, patient reports that she does have a nephrologist whose name starts with a K here in town. For blood pressure control overnight for the next 24 to 48 hours we will start patient on scheduled hydralazine IV 10 mg every 6 hours and make patient n.p.o. for imaging studies in the morning.  Acute on chronic kidney disease: Attribute to worsening dehydration secondary to hyperglycemia. I will however hold patient's ACE inhibitor and HCTZ and Demadex overnight and monitor for improvement.  With goal to restart ACE inhibitor possibly at a lower dose to prevent future Episodes of worsening kidney function.  We will renally dose all needed medications.  We will avoid any nephrotoxic agents and contrast and studies.  Hyperlipidemia: Patient is currently on Mevacor, patient states that she has taken something called "Lipitril" and was changed by her doctor.  I have therefore started patient on Crestor.  With a.m. lipid panel.   DVT prophylaxis:  Heparin GTT.   Code Status:  Full code    Family Communication:  Karle,Bennie (Spouse)  [phone] (Mobile)   Disposition Plan:  Home.    Consults called:  Cardiology.  Admission status: Inpatient.    Para Skeans MD [facility] [phone] How to contact the [facility] Attending or Consulting provider [place] or covering provider during after hours [place], for  this patient.    Check the care team in [facility] and look for a) attending/consulting [facility] provider listed and b) the [facility] team listed Log into [email or web address] and use [place]'s universal password to access. If you do not have the password, please contact the hospital operator. Locate the [facility] provider you are looking for under [facility] and page to a number that you can be directly reached. If you still have difficulty reaching the provider, please page the [facility] (Director on Call) for the Hospitalists listed on amion for assistance. [email or web address] Password [email or web address] 03/17/2021, 1:12 AM

## 2021-03-16 NOTE — ED Notes (Signed)
Pt ambulatory to toilet with standby assist. 

## 2021-03-16 NOTE — ED Provider Notes (Signed)
[facility] Emergency Department Provider Note  ____________________________________________   I have reviewed the triage vital signs and the nursing notes.   HISTORY  Chief Complaint Hyperglycemia   History limited by: Not Limited   HPI Mckenzie Wright is a 75 y.o. female who presents to the emergency department today because of concerns for elevated blood sugar.  The patient states that her doctor switched her diabetic medications 2 days ago.  She states that since that time her blood sugars have been running very high.  This has been accompanied by increased thirst.  Additionally since her medication switch she has been describing some burning at central chest pain.  Has been accompanied by some cough.  No vomiting.  Patient denies any fevers.    Records reviewed. Per medical record review patient has a history of DM, HTN, asthma.   Past Medical History:  Diagnosis Date   Asthma    Diabetes ([place])    Hypertension    Stroke [facility])     Patient Active Problem List   Diagnosis Date Noted   Healthcare maintenance 01/12/2019   CAD (coronary artery disease) 05/10/2018   Secondary hyperparathyroidism of renal origin ([place]) 04/18/2018   Aortic atherosclerosis ([place]) 04/16/2018   Renal mass, left 01/07/2018   Type 2 diabetes mellitus with stage 4 chronic kidney disease, with long-term current use of insulin ([place]) 01/07/2018   Osteopenia of multiple sites 10/08/2017   Failed total hip arthroplasty ([place]) 10/14/2016   Pure hypercholesterolemia 05/03/2016   Anemia 06/29/2015   Tachycardia 06/28/2015   Acquired hypothyroidism 06/25/2015   Chronic kidney disease, stage II (mild) 06/25/2015   Essential hypertension 06/25/2015   Hyperlipidemia 06/25/2015   Status post total hip replacement, right 06/25/2015   TIA on medication 06/25/2015    Past Surgical History:  Procedure Laterality Date   TOTAL HIP ARTHROPLASTY  2000   TOTAL HIP ARTHROPLASTY  2002   TOTAL  HIP ARTHROPLASTY  2017   TOTAL HIP ARTHROPLASTY  2018    Prior to Admission medications   Medication Sig Start Date End Date Taking? Authorizing Provider  aspirin EC 81 MG tablet Take 81 mg by mouth daily.    [provider]  Calcium Carbonate-Vitamin D 600-200 MG-UNIT TABS Take by mouth.    [provider]  insulin NPH Human (NOVOLIN N) 100 UNIT/ML injection INJECT 45 UNITS SUBCUTANEOUSLY IN THE MORNING WITH BREAKFAST AND 28 UNITS IN THE EVENING WITH DINNER 10/06/18   [provider]  levothyroxine (SYNTHROID) 88 MCG tablet Take by mouth. 11/29/19 11/28/20  [provider]  levothyroxine (SYNTHROID) 88 MCG tablet Take by mouth. 06/19/20   [provider]  lisinopril-hydrochlorothiazide (ZESTORETIC) 20-12.5 MG tablet Take 1 tablet by mouth daily. 11/29/19   [provider]  lovastatin (MEVACOR) 40 MG tablet Take 40 mg by mouth at bedtime.    [provider]  lovastatin (MEVACOR) 40 MG tablet Take by mouth. 06/19/20   [provider]  torsemide (DEMADEX) 20 MG tablet TAKE 1 TABLET BY MOUTH ONCE DAILY IN THE MORNING AS NEEDED FOR EDEMA IN LEGS 05/26/18   [provider]  vitamin B-12 (CYANOCOBALAMIN) 1000 MCG tablet Take 1,000 mcg by mouth daily.    [provider]    Allergies Patient has no known allergies.  Family History  Problem Relation Age of Onset   Breast cancer Neg Hx     Social History Social History   Tobacco Use   Smoking status: Never   Smokeless  tobacco: Never  Substance Use Topics   Alcohol use: Not Currently   Drug use: Never    Review of Systems Constitutional: No fever/chills Eyes: No visual changes. ENT: No sore throat. Cardiovascular: Positive for chest pain. Respiratory: Denies shortness of breath. Gastrointestinal: No abdominal pain.  No nausea, no vomiting.  No diarrhea.   Genitourinary: Negative for dysuria. Musculoskeletal: Negative for back pain. Skin: Negative for  rash. Neurological: Negative for headaches, focal weakness or numbness.  ____________________________________________   PHYSICAL EXAM:  VITAL SIGNS: ED Triage Vitals  Enc Vitals Group     BP 03/16/21 2102 133/72     Pulse Rate 03/16/21 2102 90     Resp 03/16/21 2102 17     Temp 03/16/21 2102 99.2 F (37.3 C)     Temp src --      SpO2 03/16/21 2102 94 %     Weight --      Height --      Head Circumference --      Peak Flow --      Pain Score 03/16/21 2101 0   Constitutional: Alert and oriented.  Eyes: Conjunctivae are normal.  ENT      Head: Normocephalic and atraumatic.      Nose: No congestion/rhinnorhea.      Mouth/Throat: Mucous membranes are moist.      Neck: No stridor. Hematological/Lymphatic/Immunilogical: No cervical lymphadenopathy. Cardiovascular: Normal rate, regular rhythm.  No murmurs, rubs, or gallops.  Respiratory: Normal respiratory effort without tachypnea nor retractions. Breath sounds are clear and equal bilaterally. No wheezes/rales/rhonchi. Gastrointestinal: Soft and non tender. No rebound. No guarding.  Genitourinary: Deferred Musculoskeletal: Normal range of motion in all extremities. No lower extremity edema. Neurologic:  Normal speech and language. No gross focal neurologic deficits are appreciated.  Skin:  Skin is warm, dry and intact. No rash noted. Psychiatric: Mood and affect are normal. Speech and behavior are normal. Patient exhibits appropriate insight and judgment.  ____________________________________________    LABS (pertinent positives/negatives)  CBC wbc 16.9, hgb 11.4, plt 163 BMP na 129, k 4.8, glu 680, cr 1.98 VBG pH 7.34   RADIOLOGY  None  ____________________________________________   PROCEDURES  Procedures  ____________________________________________   INITIAL IMPRESSION / ASSESSMENT AND PLAN / ED COURSE  Pertinent labs & imaging results that were available during my care of the patient were reviewed by me  and considered in my medical decision making (see chart for details).   Patient presented to the emergency department today because of concerns for high blood sugar in the setting of recent medication change.  Blood work here does show significantly elevated blood sugar.  Fortunately no indication that the patient is in DKA at this time.  Do think patient would benefit from admission for blood sugar control.  Will start IV fluids and IV insulin bolus here in the emergency department.  ____________________________________________   FINAL CLINICAL IMPRESSION(S) / ED DIAGNOSES  Final diagnoses:  Hyperglycemia     Note: This dictation was prepared with Dragon dictation. Any transcriptional errors that result from this process are unintentional     Nance Pear, MD 03/17/21 [phone]

## 2021-03-17 ENCOUNTER — Other Ambulatory Visit: Payer: Self-pay

## 2021-03-17 ENCOUNTER — Inpatient Hospital Stay: Payer: Medicare HMO

## 2021-03-17 ENCOUNTER — Inpatient Hospital Stay
Admit: 2021-03-17 | Discharge: 2021-03-17 | Disposition: A | Discharge status: Home / Self Care | Payer: Medicare HMO | Attending: Internal Medicine | Admitting: Internal Medicine

## 2021-03-17 DIAGNOSIS — N179 Acute kidney failure, unspecified: Secondary | ICD-10-CM | POA: Diagnosis present

## 2021-03-17 DIAGNOSIS — D649 Anemia, unspecified: Secondary | ICD-10-CM

## 2021-03-17 DIAGNOSIS — N189 Chronic kidney disease, unspecified: Secondary | ICD-10-CM | POA: Diagnosis present

## 2021-03-17 DIAGNOSIS — E039 Hypothyroidism, unspecified: Secondary | ICD-10-CM | POA: Diagnosis not present

## 2021-03-17 DIAGNOSIS — I10 Essential (primary) hypertension: Secondary | ICD-10-CM

## 2021-03-17 DIAGNOSIS — I25119 Atherosclerotic heart disease of native coronary artery with unspecified angina pectoris: Secondary | ICD-10-CM | POA: Diagnosis not present

## 2021-03-17 DIAGNOSIS — R739 Hyperglycemia, unspecified: Secondary | ICD-10-CM | POA: Diagnosis present

## 2021-03-17 DIAGNOSIS — I214 Non-ST elevation (NSTEMI) myocardial infarction: Principal | ICD-10-CM

## 2021-03-17 LAB — COMPREHENSIVE METABOLIC PANEL
ALT: 57 U/L — ABNORMAL HIGH (ref 0–44)
AST: 63 U/L — ABNORMAL HIGH (ref 15–41)
Albumin: 3.5 g/dL (ref 3.5–5.0)
Alkaline Phosphatase: 94 U/L (ref 38–126)
Anion gap: 7 (ref 5–15)
BUN: 37 mg/dL — ABNORMAL HIGH (ref 8–23)
CO2: 25 mmol/L (ref 22–32)
Calcium: 9.3 mg/dL (ref 8.9–10.3)
Chloride: 105 mmol/L (ref 98–111)
Creatinine, Ser: 1.52 mg/dL — ABNORMAL HIGH (ref 0.44–1.00)
GFR, Estimated: 35 mL/min — ABNORMAL LOW (ref >60)
Glucose, Bld: 388 mg/dL — ABNORMAL HIGH (ref 70–99)
Potassium: 3.8 mmol/L (ref 3.5–5.1)
Sodium: 137 mmol/L (ref 135–145)
Total Bilirubin: 1.4 mg/dL — ABNORMAL HIGH (ref 0.3–1.2)
Total Protein: 6.7 g/dL (ref 6.5–8.1)

## 2021-03-17 LAB — TYPE AND SCREEN
ABO/RH(D): O NEG
Antibody Screen: NEGATIVE

## 2021-03-17 LAB — GLUCOSE, CAPILLARY
Glucose-Capillary: 411 mg/dL — ABNORMAL HIGH (ref 70–99)
Glucose-Capillary: 369 mg/dL — ABNORMAL HIGH (ref 70–99)
Glucose-Capillary: 312 mg/dL — ABNORMAL HIGH (ref 70–99)
Glucose-Capillary: 162 mg/dL — ABNORMAL HIGH (ref 70–99)
Glucose-Capillary: 169 mg/dL — ABNORMAL HIGH (ref 70–99)
Glucose-Capillary: 219 mg/dL — ABNORMAL HIGH (ref 70–99)
Glucose-Capillary: 182 mg/dL — ABNORMAL HIGH (ref 70–99)

## 2021-03-17 LAB — ECHOCARDIOGRAM COMPLETE
AR max vel: 1.46 cm2
AV Peak grad: 6.7 mmHg
Ao pk vel: 1.29 m/s
Area-P 1/2: 6.6 cm2
Height: 67 in
S' Lateral: 3.3 cm
Single Plane A4C EF: 60.7 %
Weight: 2585.55 oz

## 2021-03-17 LAB — CBC
HCT: 31.7 % — ABNORMAL LOW (ref 36.0–46.0)
Hemoglobin: 10.8 g/dL — ABNORMAL LOW (ref 12.0–15.0)
MCH: 32.8 pg (ref 26.0–34.0)
MCHC: 34.1 g/dL (ref 30.0–36.0)
MCV: 96.4 fL (ref 80.0–100.0)
Platelets: 153 10*3/uL (ref 150–400)
RBC: 3.29 MIL/uL — ABNORMAL LOW (ref 3.87–5.11)
RDW: 11.9 % (ref 11.5–15.5)
WBC: 16.8 10*3/uL — ABNORMAL HIGH (ref 4.0–10.5)
nRBC: 0 % (ref 0.0–0.2)

## 2021-03-17 LAB — RESP PANEL BY RT-PCR (FLU A&B, COVID) ARPGX2
Influenza A by PCR: NEGATIVE
Influenza B by PCR: NEGATIVE
SARS Coronavirus 2 by RT PCR: NEGATIVE

## 2021-03-17 LAB — LIPID PANEL
Cholesterol: 126 mg/dL (ref 0–200)
HDL: 41 mg/dL (ref >40)
LDL Cholesterol: 64 mg/dL (ref 0–99)
Total CHOL/HDL Ratio: 3.1 RATIO
Triglycerides: 103 mg/dL (ref <150)
VLDL: 21 mg/dL (ref 0–40)

## 2021-03-17 LAB — BRAIN NATRIURETIC PEPTIDE: B Natriuretic Peptide: 553.5 pg/mL — ABNORMAL HIGH (ref 0.0–100.0)

## 2021-03-17 LAB — HEPARIN LEVEL (UNFRACTIONATED)
Heparin Unfractionated: 0.27 IU/mL — ABNORMAL LOW (ref 0.30–0.70)
Heparin Unfractionated: 0.43 IU/mL (ref 0.30–0.70)

## 2021-03-17 LAB — TSH: TSH: 2.248 u[IU]/mL (ref 0.350–4.500)

## 2021-03-17 LAB — TROPONIN I (HIGH SENSITIVITY)
Troponin I (High Sensitivity): 6466 ng/L (ref <18)
Troponin I (High Sensitivity): 5884 ng/L (ref <18)
Troponin I (High Sensitivity): 5154 ng/L (ref <18)
Troponin I (High Sensitivity): 5157 ng/L (ref <18)

## 2021-03-17 LAB — CK: Total CK: 262 U/L — ABNORMAL HIGH (ref 38–234)

## 2021-03-17 LAB — CBG MONITORING, ED: Glucose-Capillary: 416 mg/dL — ABNORMAL HIGH (ref 70–99)

## 2021-03-17 LAB — APTT: aPTT: 26 seconds (ref 24–36)

## 2021-03-17 LAB — T4, FREE: Free T4: 1.03 ng/dL (ref 0.61–1.12)

## 2021-03-17 LAB — PROTIME-INR
INR: 1.2 (ref 0.8–1.2)
Prothrombin Time: 14.7 seconds (ref 11.4–15.2)

## 2021-03-17 LAB — GAMMA GT: GGT: 61 U/L — ABNORMAL HIGH (ref 7–50)

## 2021-03-17 LAB — LIPASE, BLOOD: Lipase: 23 U/L (ref 11–51)

## 2021-03-17 LAB — D-DIMER, QUANTITATIVE: D-Dimer, Quant: 1.41 ug/mL-FEU — ABNORMAL HIGH (ref 0.00–0.50)

## 2021-03-17 LAB — MAGNESIUM: Magnesium: 1.6 mg/dL — ABNORMAL LOW (ref 1.7–2.4)

## 2021-03-17 LAB — PROCALCITONIN: Procalcitonin: 0.9 ng/mL

## 2021-03-17 IMAGING — DX DG CHEST 1V
1 series · 1 of 1 positions shown · non-contrast
Comparison: [DATE]

CLINICAL DATA: Weakness and fatigue.

EXAM:
CHEST  1 VIEW

[chest ap]
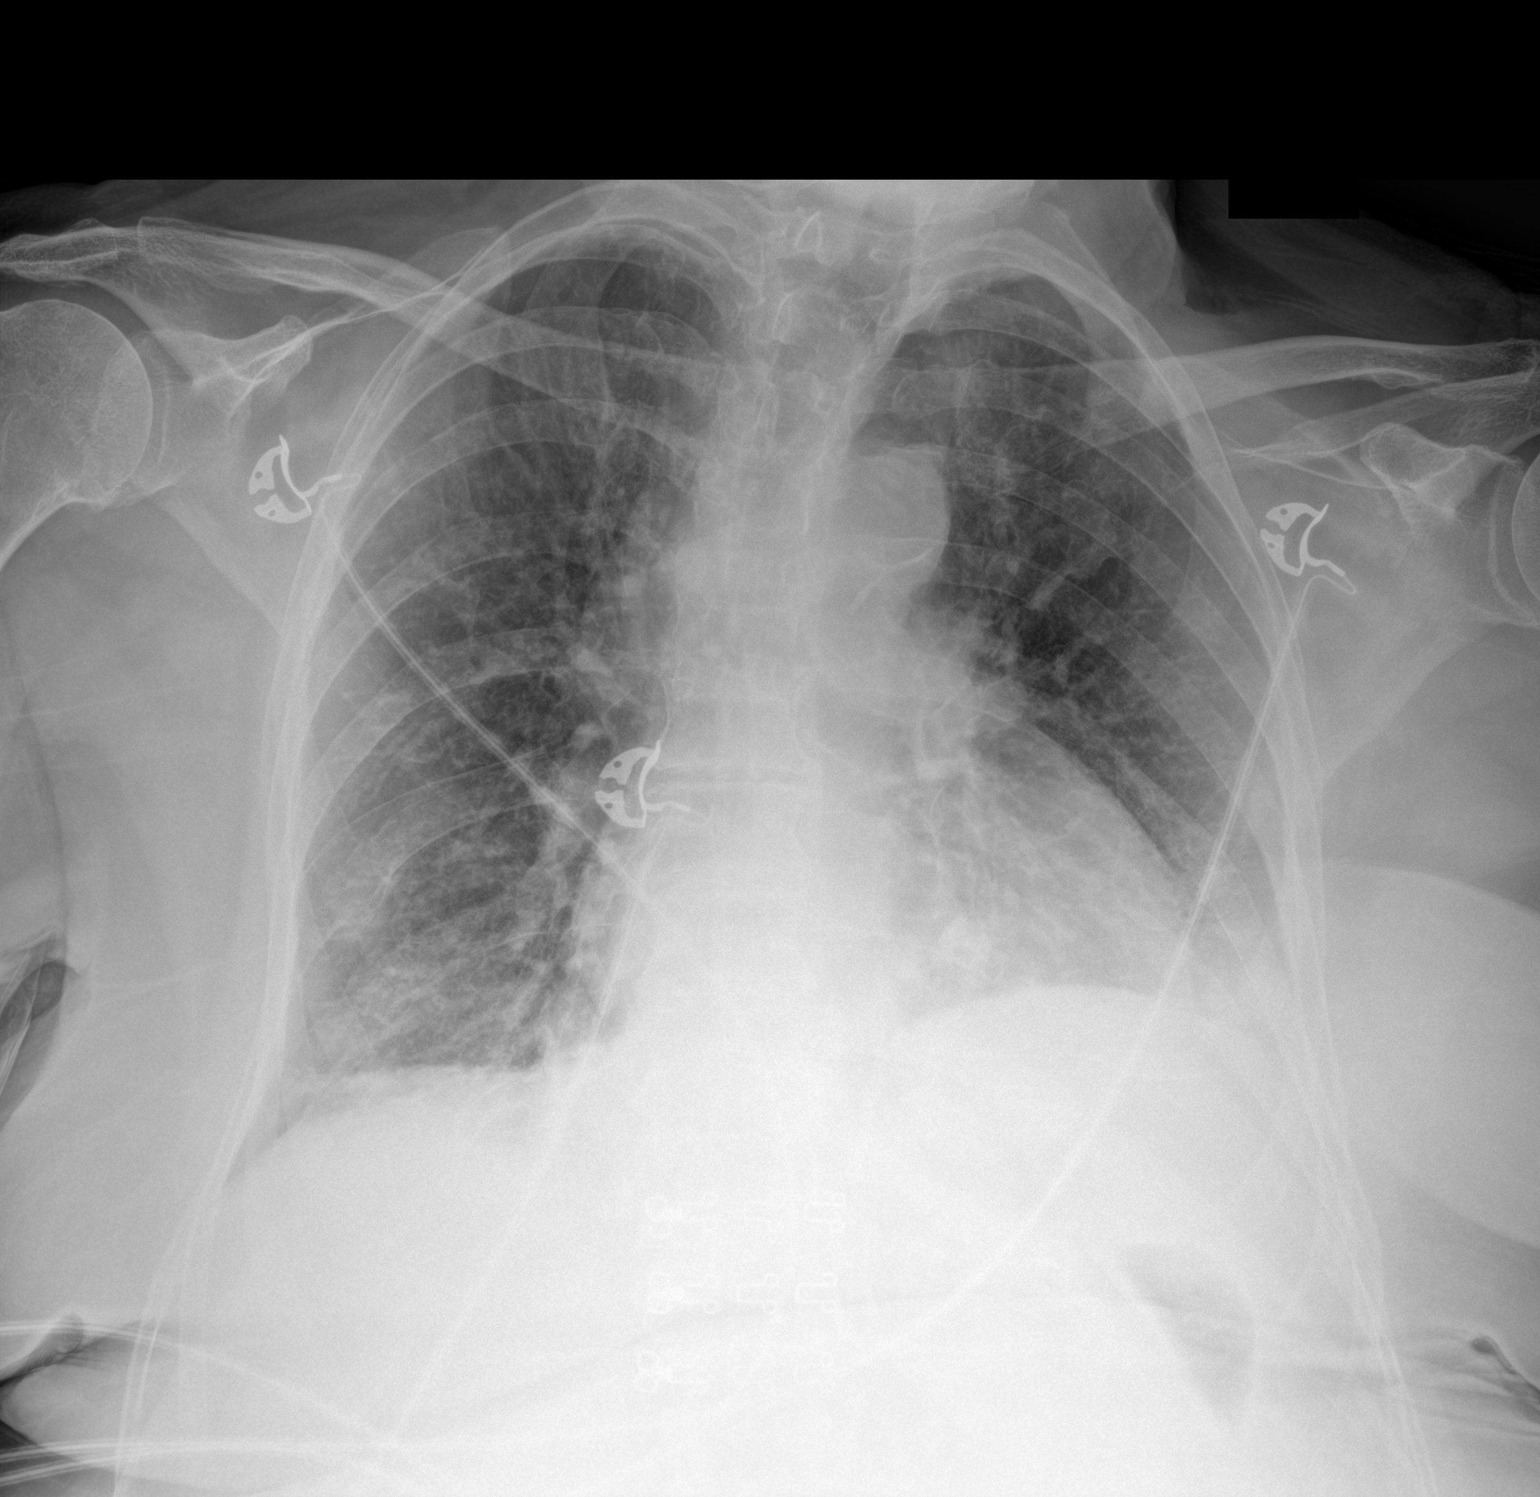

[1 of 1 positions shown; findings below may reference images not displayed]

FINDINGS: Mild, diffusely increased interstitial lung markings are seen. There
is no evidence of a pleural effusion or pneumothorax. The heart size
and mediastinal contours are within normal limits. There is mild
calcification of the aortic arch. Degenerative changes are noted
throughout the thoracic spine.
IMPRESSION: Findings suggestive of mild interstitial edema.

## 2021-03-17 MED ORDER — SODIUM CHLORIDE 0.9% FLUSH
3.0000 mL | Freq: Two times a day (BID) | INTRAVENOUS | Status: DC
  Administered 2021-03-17 – 2021-03-18 (×2): 3 mL via INTRAVENOUS

## 2021-03-17 MED ORDER — HEPARIN BOLUS VIA INFUSION
1100.0000 [IU] | Freq: Once | INTRAVENOUS | Status: AC
  Administered 2021-03-17: 1100 [IU] via INTRAVENOUS
  Filled 2021-03-17: qty 1100

## 2021-03-17 MED ORDER — INSULIN DETEMIR 100 UNIT/ML ~~LOC~~ SOLN
20.0000 [IU] | Freq: Two times a day (BID) | SUBCUTANEOUS | Status: DC
  Administered 2021-03-17: 20 [IU] via SUBCUTANEOUS
  Filled 2021-03-17 (×2): qty 0.2

## 2021-03-17 MED ORDER — PANTOPRAZOLE SODIUM 40 MG IV SOLR
40.0000 mg | INTRAVENOUS | Status: DC
  Administered 2021-03-17 – 2021-03-18 (×2): 40 mg via INTRAVENOUS
  Filled 2021-03-17 (×2): qty 40

## 2021-03-17 MED ORDER — NITROGLYCERIN 0.4 MG SL SUBL: 0.4000 mg | SUBLINGUAL_TABLET | SUBLINGUAL | Status: DC | PRN

## 2021-03-17 MED ORDER — HEPARIN BOLUS VIA INFUSION
4000.0000 [IU] | Freq: Once | INTRAVENOUS | Status: AC
  Administered 2021-03-17: 4000 [IU] via INTRAVENOUS
  Filled 2021-03-17: qty 4000

## 2021-03-17 MED ORDER — ROSUVASTATIN CALCIUM 20 MG PO TABS
20.0000 mg | ORAL_TABLET | Freq: Every day | ORAL | Status: DC
  Filled 2021-03-17: qty 1

## 2021-03-17 MED ORDER — INSULIN ASPART 100 UNIT/ML IJ SOLN
0.0000 [IU] | INTRAMUSCULAR | Status: DC
  Administered 2021-03-17: 15 [IU] via SUBCUTANEOUS
  Administered 2021-03-17: 5 [IU] via SUBCUTANEOUS
  Administered 2021-03-17: 3 [IU] via SUBCUTANEOUS
  Administered 2021-03-17: 15 [IU] via SUBCUTANEOUS
  Administered 2021-03-17: 11 [IU] via SUBCUTANEOUS
  Administered 2021-03-17 – 2021-03-18 (×4): 3 [IU] via SUBCUTANEOUS
  Filled 2021-03-17 (×9): qty 1

## 2021-03-17 MED ORDER — METOPROLOL TARTRATE 25 MG PO TABS
12.5000 mg | ORAL_TABLET | Freq: Two times a day (BID) | ORAL | Status: DC
  Administered 2021-03-17 – 2021-03-18 (×2): 12.5 mg via ORAL
  Filled 2021-03-17 (×2): qty 1

## 2021-03-17 MED ORDER — MAGNESIUM SULFATE 2 GM/50ML IV SOLN
2.0000 g | Freq: Once | INTRAVENOUS | Status: AC
  Administered 2021-03-17: 2 g via INTRAVENOUS
  Filled 2021-03-17: qty 50

## 2021-03-17 MED ORDER — ROSUVASTATIN CALCIUM 10 MG PO TABS
20.0000 mg | ORAL_TABLET | Freq: Every day | ORAL | Status: DC
  Administered 2021-03-17 – 2021-03-18 (×2): 20 mg via ORAL
  Filled 2021-03-17 (×2): qty 2
  Filled 2021-03-17 (×2): qty 1

## 2021-03-17 MED ORDER — MORPHINE SULFATE (PF) 2 MG/ML IV SOLN: 1.0000 mg | INTRAVENOUS | Status: DC | PRN

## 2021-03-17 MED ORDER — HEPARIN (PORCINE) 25000 UT/250ML-% IV SOLN
1200.0000 [IU]/h | INTRAVENOUS | Status: DC
  Administered 2021-03-17: 900 [IU]/h via INTRAVENOUS
  Administered 2021-03-18: 1200 [IU]/h via INTRAVENOUS
  Administered 2021-03-18: 1050 [IU]/h via INTRAVENOUS
  Filled 2021-03-17 (×2): qty 250

## 2021-03-17 NOTE — Assessment & Plan Note (Signed)
-  Hold ARB - STart BB -Continue aspirin, crestor

## 2021-03-17 NOTE — Hospital Course (Addendum)
Mrs. Maphis is a 75 y.o. F with DM, HTN, asthma and hx stroke without residuals who presented with chest pain and shortness of breath, waxing and waning over several days, now more severe.  In the ER, ECG showed Q waves and anterior TWI.  Troponin >6000 and trended down.  Cr 1.9.  Glucose 600.   10/1 Started on heparin and admitted for NSTEMI 10/3 LHC showed 3V disease, Cardiology recommended evaluation for coronary bypass grafting

## 2021-03-17 NOTE — Assessment & Plan Note (Signed)
With severe hyperglycemia at arrival, due to NSTEMI.  Glucose down to 300s this AM.  NPO this AM. - Start levemir 20 BID (uses ~80 units 70/30 at home daily), expect to increase levemir as she starts diet -Continue SS corrections -Hold home 70/30

## 2021-03-17 NOTE — ED Notes (Addendum)
Critical troponin--6466--MD notified and aware.

## 2021-03-17 NOTE — Assessment & Plan Note (Signed)
-  Continue heparin ggt -Continue aspirin, Crestor - Start low dose BB - obtain echo - Consult cardiology, appreciate cares

## 2021-03-17 NOTE — Consult Note (Signed)
[facility] Consultation Note  Patient ID: Mckenzie Wright, MRN: [id number], DOB/AGE: 1946/01/31 75 y.o. Admit date: 03/16/2021   Date of Consult: 03/17/2021 Primary Physician: Kirk Ruths, MD Primary Cardiologist: None  Chief Complaint:  Chief Complaint  Patient presents with   Hyperglycemia   Reason for Consult:  Myocardial infarction  HPI: 75 y.o. female with known chronic kidney disease stage IV and hypertension who has had new onset of substernal chest discomfort shortness of breath weakness and fatigue over the last several days with waxing and waning over time.  This has culminated in the patient needing to be seen in the emergency room.  At which time the patient has had a troponin of 6466/5884 consistent with non-ST elevation myocardial infarction.  Additionally she had a BNP of 553 glomerular filtration rate of 26 and a chest x-ray showing mild edema.  EKG has shown normal sinus rhythm left atrial enlargement inferior infarct age undetermined with anterior precordial T wave inversions.  The patient was placed on heparin for which patient has done fairly well with no evidence of significant further chest discomfort over the last 24 hours.  The patient is feeling well.  We have discussed at length other diagnostic testing and treatment options including the possibility of cardiac catheterization.  Patient understands that there are significant concerns for chronic kidney disease with dye as well as other risk factor management  Past Medical History:  Diagnosis Date   Asthma    Diabetes ([place])    Hypertension    Stroke [facility])       Surgical History:  Past Surgical History:  Procedure Laterality Date   TOTAL HIP ARTHROPLASTY  2000   TOTAL HIP ARTHROPLASTY  2002   TOTAL HIP ARTHROPLASTY  2017   TOTAL HIP ARTHROPLASTY  2018     Home Meds: Prior to Admission medications   Medication Sig Start Date End Date Taking? Authorizing Provider  aspirin EC 81 MG tablet  Take 81 mg by mouth daily.    [provider]  Calcium Carbonate-Vitamin D 600-200 MG-UNIT TABS Take by mouth.    [provider]  insulin NPH Human (NOVOLIN N) 100 UNIT/ML injection INJECT 45 UNITS SUBCUTANEOUSLY IN THE MORNING WITH BREAKFAST AND 28 UNITS IN THE EVENING WITH DINNER 10/06/18   [provider]  levothyroxine (SYNTHROID) 88 MCG tablet Take by mouth. 11/29/19 11/28/20  [provider]  levothyroxine (SYNTHROID) 88 MCG tablet Take by mouth. 06/19/20   [provider]  lisinopril-hydrochlorothiazide (ZESTORETIC) 20-12.5 MG tablet Take 1 tablet by mouth daily. 11/29/19   [provider]  lovastatin (MEVACOR) 40 MG tablet Take 40 mg by mouth at bedtime.    [provider]  lovastatin (MEVACOR) 40 MG tablet Take by mouth. 06/19/20   [provider]  torsemide (DEMADEX) 20 MG tablet TAKE 1 TABLET BY MOUTH ONCE DAILY IN THE MORNING AS NEEDED FOR EDEMA IN LEGS 05/26/18   [provider]  vitamin B-12 (CYANOCOBALAMIN) 1000 MCG tablet Take 1,000 mcg by mouth daily.    [provider]    Inpatient Medications:   aspirin EC  81 mg Oral Daily   hydrALAZINE  10 mg Intravenous Q6H   insulin aspart  0-15 Units Subcutaneous Q4H   levothyroxine  88 mcg Oral Q0600   pantoprazole (PROTONIX) IV  40 mg Intravenous Q24H   rosuvastatin  20 mg Oral Q0600   sodium chloride flush  3 mL Intravenous Q12H    sodium chloride 50  mL/hr at 03/17/21 0324   heparin 900 Units/hr (03/17/21 0326)    Allergies: No Known Allergies  Social History   Socioeconomic History   Marital status: Widowed    Spouse name: Not on file   Number of children: Not on file   Years of education: Not on file   Highest education level: Not on file  Occupational History   Not on file  Tobacco Use   Smoking status: Never   Smokeless tobacco: Never  Substance and Sexual Activity   Alcohol use: Not Currently   Drug use: Never   Sexual  activity: Yes    Birth control/protection: None, Post-menopausal  Other Topics Concern   Not on file  Social History Narrative   Not on file   Social Determinants of Health   Financial Resource Strain: Not on file  Food Insecurity: Not on file  Transportation Needs: Not on file  Physical Activity: Not on file  Stress: Not on file  Social Connections: Not on file  Intimate Partner Violence: Not on file     Family History  Problem Relation Age of Onset   Breast cancer Neg Hx      Review of Systems Positive for shortness of breath chest pain Negative for: General:  chills, fever, night sweats or weight changes.  Cardiovascular: PND orthopnea syncope dizziness  Dermatological skin lesions rashes Respiratory: Cough congestion Urologic: Frequent urination urination at night and hematuria Abdominal: negative for nausea, vomiting, diarrhea, bright red blood per rectum, melena, or hematemesis Neurologic: negative for visual changes, and/or hearing changes  All other systems reviewed and are otherwise negative except as noted above.  Labs: No results for input(s): CKTOTAL, CKMB, TROPONINI in the last 72 hours. Lab Results  Component Value Date   WBC 16.8 (H) 03/17/2021   HGB 10.8 (L) 03/17/2021   HCT 31.7 (L) 03/17/2021   MCV 96.4 03/17/2021   PLT 153 03/17/2021    Recent Labs  Lab 03/16/21 2200  NA 129*  K 4.8  CL 96*  CO2 25  BUN 46*  CREATININE 1.98*  CALCIUM 9.5  GLUCOSE 680*   Lab Results  Component Value Date   CHOL 126 03/17/2021   HDL 41 03/17/2021   LDLCALC 64 03/17/2021   TRIG 103 03/17/2021   Lab Results  Component Value Date   DDIMER 1.41 (H) 03/17/2021    Radiology/Studies:  DG Chest 1 View  Result Date: 03/17/2021 CLINICAL DATA:  Weakness and fatigue. EXAM: CHEST  1 VIEW COMPARISON:  May 07, 2009 FINDINGS: Mild, diffusely increased interstitial lung markings are seen. There is no evidence of a pleural effusion or pneumothorax. The heart  size and mediastinal contours are within normal limits. There is mild calcification of the aortic arch. Degenerative changes are noted throughout the thoracic spine. IMPRESSION: Findings suggestive of mild interstitial edema. Electronically Signed   By: Virgina Norfolk M.D.   On: 03/17/2021 00:33    EKG: Normal sinus rhythm left atrial enlargement inferior infarct age undetermined anterior precordial T wave inversions  Weights: Filed Weights   03/17/21 0139  Weight: 73.3 kg     Physical Exam: Blood pressure 139/71, pulse 90, temperature 98.8 F (37.1 C), resp. rate 18, height '5\' 7"'$  (1.702 m), weight 73.3 kg, SpO2 99 %. Body mass index is 25.31 kg/m. General: Well developed, well nourished, in no acute distress. Head eyes ears nose throat: Normocephalic, atraumatic, sclera non-icteric, no xanthomas, nares are without discharge. No apparent thyromegaly and/or mass  Lungs: Normal respiratory  effort.  no wheezes, no rales, no rhonchi.  Heart: RRR with normal S1 S2. no murmur gallop, no rub, PMI is normal size and placement, carotid upstroke normal without bruit, jugular venous pressure is normal Abdomen: Soft, non-tender, non-distended with normoactive bowel sounds. No hepatomegaly. No rebound/guarding. No obvious abdominal masses. Abdominal aorta is normal size without bruit Extremities: No edema. no cyanosis, no clubbing, no ulcers  Peripheral : 2+ bilateral upper extremity pulses, 2+ bilateral femoral pulses, 2+ bilateral dorsal pedal pulse Neuro: Alert and oriented. No facial asymmetry. No focal deficit. Moves all extremities spontaneously. Musculoskeletal: Normal muscle tone without kyphosis Psych:  Responds to questions appropriately with a normal affect.    Assessment: 75 year old female with chronic kidney disease and acute non-ST elevation myocardial infarction with mild congestive heart failure stable at this time needing further treatment options  Plan: 1.  Continue heparin  for further risk reduction and acute non-ST elevation myocardial infarction 2.  Beta-blocker low-dose for heart rate control and further risk reduction cardiovascular event 3.  Echocardiogram for LV systolic dysfunction valvular heart disease contributing to above 4.  Proceed to cardiac catheterization to assess coronary anatomy and further treatment thereof is necessary.  Patient understands risk and benefits of cardiac catheterization.  This includes a possibility GastroGard attack infection bleeding or blood clot and kidney dysfunction.  Patient is at low risk for moderate sedation 5.  Further treatment options after above  Signed, Corey Skains M.D. [facility] 03/17/2021, 7:05 AM

## 2021-03-17 NOTE — Progress Notes (Signed)
ANTICOAGULATION CONSULT NOTE - Initial Consult  Pharmacy Consult for Heparin  Indication: chest pain/ACS  No Known Allergies  Patient Measurements: Height: '5\' 7"'$  (170.2 cm) Weight: 73.3 kg (161 lb 9.6 oz) IBW/kg (Calculated) : 61.6 Heparin Dosing Weight: 73.3 kg   Vital Signs: Temp: 99.2 F (37.3 C) (10/01 2102) BP: 117/62 (10/02 0052) Pulse Rate: 88 (10/02 0052)  Labs: Recent Labs    03/16/21 2200  HGB 11.4*  HCT 34.2*  PLT 163  CREATININE 1.98*  TROPONINIHS 6,466*    Estimated Creatinine Clearance: 23.1 mL/min (A) (by C-G formula based on SCr of 1.98 mg/dL (H)).   Medical History: Past Medical History:  Diagnosis Date   Asthma    Diabetes ([place])    Hypertension    Stroke [facility])     Medications:  (Not in a hospital admission)   Assessment: Pharmacy consulted to dose heparin in this 75 year old female admitted with ACS/NSTEMI.  CrCl = 23.1 ml/min No prior anticoag noted.   Goal of Therapy:  Heparin level 0.3-0.7 units/ml Monitor platelets by anticoagulation protocol: Yes   Plan:  Give 4000 units bolus x 1 Start heparin infusion at 900 units/hr Check anti-Xa level in 8 hours and daily while on heparin Continue to monitor H&H and platelets  Diron Haddon D 03/17/2021,1:44 AM

## 2021-03-17 NOTE — Assessment & Plan Note (Signed)
Continue levothyroxine 

## 2021-03-17 NOTE — Assessment & Plan Note (Signed)
Anemia of CKD IV.  Hgb at baseline

## 2021-03-17 NOTE — Assessment & Plan Note (Signed)
BP normal -Hold losartan and HCT -start BB

## 2021-03-17 NOTE — Progress Notes (Signed)
ANTICOAGULATION CONSULT NOTE - Initial Consult  Pharmacy Consult for Heparin  Indication: chest pain/ACS  No Known Allergies  Patient Measurements: Height: '5\' 7"'$  (170.2 cm) Weight: 73.3 kg (161 lb 9.6 oz) IBW/kg (Calculated) : 61.6 Heparin Dosing Weight: 73.3 kg   Vital Signs: Temp: 99.3 F (37.4 C) (10/02 1934) Temp Source: Oral (10/02 1230) BP: 109/54 (10/02 1934) Pulse Rate: 81 (10/02 1934)  Labs: Recent Labs    03/16/21 2200 03/17/21 0153 03/17/21 0547 03/17/21 0750 03/17/21 1131 03/17/21 2041  HGB 11.4*  --  10.8*  --   --   --   HCT 34.2*  --  31.7*  --   --   --   PLT 163  --  153  --   --   --   APTT  --  26  --   --   --   --   LABPROT  --  14.7  --   --   --   --   INR  --  1.2  --   --   --   --   HEPARINUNFRC  --   --   --   --  0.27* 0.43  CREATININE 1.98*  --  1.52*  --   --   --   CKTOTAL  --   --  262*  --   --   --   TROPONINIHS [id number]* 5,154* 5,157*  --   --      Estimated Creatinine Clearance: 30.1 mL/min (A) (by C-G formula based on SCr of 1.52 mg/dL (H)).   Medical History: Past Medical History:  Diagnosis Date   Asthma    Diabetes ([place])    Hypertension    Stroke ([place])     Medications:  Medications Prior to Admission  Medication Sig Dispense Refill Last Dose   aspirin EC 81 MG tablet Take 81 mg by mouth daily.   03/16/2021   Calcium Carbonate-Vitamin D 600-200 MG-UNIT TABS Take by mouth.   03/16/2021   insulin NPH Human (NOVOLIN N) 100 UNIT/ML injection INJECT 45 UNITS SUBCUTANEOUSLY IN THE MORNING WITH BREAKFAST AND 28 UNITS IN THE EVENING WITH DINNER (Patient not taking: Reported on 03/17/2021)   Not Taking   levothyroxine (SYNTHROID) 88 MCG tablet Take by mouth.      levothyroxine (SYNTHROID) 88 MCG tablet Take by mouth.      lisinopril-hydrochlorothiazide (ZESTORETIC) 20-12.5 MG tablet Take 1 tablet by mouth daily.      lovastatin (MEVACOR) 40 MG tablet Take 40 mg by mouth at bedtime.      lovastatin (MEVACOR) 40 MG tablet  Take by mouth.      torsemide (DEMADEX) 20 MG tablet TAKE 1 TABLET BY MOUTH ONCE DAILY IN THE MORNING AS NEEDED FOR EDEMA IN LEGS      vitamin B-12 (CYANOCOBALAMIN) 1000 MCG tablet Take 1,000 mcg by mouth daily.       Assessment: Pharmacy consulted to dose heparin in this 75 year old female admitted with ACS/NSTEMI. No prior anticoag noted. Heparin Dosing Weight: 73.3 kg   Date Time   HL Rate/Comment 10/02 1131 0.27 Subthera; 900>1050 un/hr  10/02 2041 0.43 Therapeutic, 1050 un/hr     Baseline Labs: aPTT - 26s INR - 1.2 Hgb - 11.4>10.8 Plts - 163>153 Trop - 6466> 5157  Goal of Therapy:  Heparin level 0.3-0.7 units/ml Monitor platelets by anticoagulation protocol: Yes   Plan:  Heparin level therapeutic at 0.43  Continue heparin infusion at 1050 units/hr Check  confirmatory anti-Xa level in 8 hours and daily while on heparin Continue to monitor H&H and platelets  Darnelle Bos, PharmD Clinical Pharmacist 03/17/2021,9:12 PM

## 2021-03-17 NOTE — Assessment & Plan Note (Signed)
Baseline Cr 1.5-1/8, stable relative to baseline

## 2021-03-17 NOTE — Progress Notes (Signed)
*  PRELIMINARY RESULTS* Echocardiogram 2D Echocardiogram has been performed.  Mckenzie Wright 03/17/2021, 10:58 AM

## 2021-03-17 NOTE — Progress Notes (Signed)
  Progress Note    Mckenzie Wright   [id number]  DOB: [birth year]  DOA: 03/16/2021     1 Date of Service: 03/17/2021   Brief summary: Mckenzie Wright is a 75 y.o. F with DM, HTN, asthma and hx stroke without residuals who presented with chest pain and shortness of breath, waxing and waning over several days, now more severe.  In the ER, ECG showed Q waves and anterior TWI.  Troponin >6000 and trended down.  Cr 1.9.  Glucose 600.   10/1 Started on heparin and admitted for NSTEMI       Subjective:  No chest pain, no dyspnea, no swelling, no confusion.  Very HOH. No vomiting, orthopnea.   Hospital Problems * NSTEMI (non-ST elevated myocardial infarction) ([place]) -Continue heparin ggt -Continue aspirin, Crestor - Start low dose BB - obtain echo - Consult cardiology, appreciate cares   Essential hypertension BP normal -Hold losartan and HCT -start BB  CAD (coronary artery disease) -Hold ARB - STart BB -Continue aspirin, crestor  Type 2 diabetes mellitus with stage 4 chronic kidney disease, with long-term current use of insulin ([place]) With severe hyperglycemia at arrival, due to NSTEMI.  Glucose down to 300s this AM.  NPO this AM. - Start levemir 20 BID (uses ~80 units 70/30 at home daily), expect to increase levemir as she starts diet -Continue SS corrections -Hold home 70/30  CKD (chronic kidney disease), stage IV ([facility]) Baseline Cr 1.5-1/8, stable relative to baseline  Anemia Anemia of CKD IV.  Hgb at baseline  Acquired hypothyroidism -Continue levothyroxine  Hypomagnesemia - Supplement Mag        Objective Vital signs were reviewed and unremarkable except for: Respirations: elevated.  BP (!) 110/55 (BP Location: Right Arm)   Pulse 85   Temp 98.1 F (36.7 C)   Resp 17   Ht '5\' 7"'$  (1.702 m)   Wt 73.3 kg   SpO2 94%   BMI 25.31 kg/m       Exam General appearance: Elderly adult female, lying in bed, no acute distress.     HEENT: Anicteric,  conjunctival pink, lids and lashes normal.  No nasal forming, discharge, or epistaxis.  Dentures in place, oropharynx tacky dry, no oral lesions, hearing very diminished. Skin:  Cardiac: RRR, no murmurs, no lower extremity edema, no JVD Respiratory: Increased respiratory rate, normal rhythm, no rales or wheezing appreciated Abdomen: Abdomen soft without tenderness palpation or guarding, no ascites or distention MSK:  Neuro: Awake and alert, extraocular movements intact, moves all extremities with generalized weakness but symmetric strength Psych: Attention normal, affect appropriate, judgment insight appear normal    Labs / Other Information My review of labs, imaging, notes and other tests is significant for elevated troponin, LDL good, Cr down to 1.5, Glucose elevated, anemia, Hgb 11, Mag low, BNP elevated     Time spent: 35 minutes [facility] 03/17/2021, 4:05 PM

## 2021-03-17 NOTE — Progress Notes (Signed)
ANTICOAGULATION CONSULT NOTE - Initial Consult  Pharmacy Consult for Heparin  Indication: chest pain/ACS  No Known Allergies  Patient Measurements: Height: '5\' 7"'$  (170.2 cm) Weight: 73.3 kg (161 lb 9.6 oz) IBW/kg (Calculated) : 61.6 Heparin Dosing Weight: 73.3 kg   Vital Signs: Temp: 98.7 F (37.1 C) (10/02 0717) BP: 118/62 (10/02 0717) Pulse Rate: 88 (10/02 0717)  Labs: Recent Labs    03/16/21 2200 03/17/21 0153 03/17/21 0547 03/17/21 0750 03/17/21 1131  HGB 11.4*  --  10.8*  --   --   HCT 34.2*  --  31.7*  --   --   PLT 163  --  153  --   --   APTT  --  26  --   --   --   LABPROT  --  14.7  --   --   --   INR  --  1.2  --   --   --   HEPARINUNFRC  --   --   --   --  0.27*  CREATININE 1.98*  --  1.52*  --   --   CKTOTAL  --   --  262*  --   --   TROPONINIHS 6,466* 5,884* 5,154* 5,157*  --      Estimated Creatinine Clearance: 30.1 mL/min (A) (by C-G formula based on SCr of 1.52 mg/dL (H)).   Medical History: Past Medical History:  Diagnosis Date   Asthma    Diabetes ([place])    Hypertension    Stroke ([place])     Medications:  Medications Prior to Admission  Medication Sig Dispense Refill Last Dose   aspirin EC 81 MG tablet Take 81 mg by mouth daily.      Calcium Carbonate-Vitamin D 600-200 MG-UNIT TABS Take by mouth.      insulin NPH Human (NOVOLIN N) 100 UNIT/ML injection INJECT 45 UNITS SUBCUTANEOUSLY IN THE MORNING WITH BREAKFAST AND 28 UNITS IN THE EVENING WITH DINNER      levothyroxine (SYNTHROID) 88 MCG tablet Take by mouth.      levothyroxine (SYNTHROID) 88 MCG tablet Take by mouth.      lisinopril-hydrochlorothiazide (ZESTORETIC) 20-12.5 MG tablet Take 1 tablet by mouth daily.      lovastatin (MEVACOR) 40 MG tablet Take 40 mg by mouth at bedtime.      lovastatin (MEVACOR) 40 MG tablet Take by mouth.      torsemide (DEMADEX) 20 MG tablet TAKE 1 TABLET BY MOUTH ONCE DAILY IN THE MORNING AS NEEDED FOR EDEMA IN LEGS      vitamin B-12 (CYANOCOBALAMIN) 1000  MCG tablet Take 1,000 mcg by mouth daily.       Assessment: Pharmacy consulted to dose heparin in this 75 year old female admitted with ACS/NSTEMI. No prior anticoag noted. Heparin Dosing Weight: 73.3 kg   Date Time   HL Rate/Comment 1002 1131 0.27 Subthera; 900>1050 un/hr     Baseline Labs: aPTT - 26s INR - 1.2 Hgb - 11.4>10.8 Plts - 163>153 Trop - 6466> 5157  Goal of Therapy:  Heparin level 0.3-0.7 units/ml Monitor platelets by anticoagulation protocol: Yes   Plan:  Heparin level slightly  subtherapeutic at 0.27 (goal 0.3-0.7). Give 1100 units bolus x 1; increase heparin infusion to 1050 units/hr Check anti-Xa level in 8 hours and daily while on heparin Continue to monitor H&H and platelets  Lorna Dibble, PharmD, [facility] Clinical Pharmacist 03/17/2021,12:19 PM

## 2021-03-18 ENCOUNTER — Encounter: Payer: Self-pay | Admitting: Internal Medicine

## 2021-03-18 ENCOUNTER — Encounter
Admission: EM | Disposition: A | Discharge status: Other Acute Inpatient Hospital | Payer: Self-pay | Source: Home / Self Care | Attending: Family Medicine

## 2021-03-18 DIAGNOSIS — I4892 Unspecified atrial flutter: Secondary | ICD-10-CM | POA: Diagnosis not present

## 2021-03-18 DIAGNOSIS — D631 Anemia in chronic kidney disease: Secondary | ICD-10-CM | POA: Diagnosis not present

## 2021-03-18 DIAGNOSIS — E039 Hypothyroidism, unspecified: Secondary | ICD-10-CM | POA: Diagnosis not present

## 2021-03-18 DIAGNOSIS — J811 Chronic pulmonary edema: Secondary | ICD-10-CM | POA: Diagnosis not present

## 2021-03-18 DIAGNOSIS — I5021 Acute systolic (congestive) heart failure: Secondary | ICD-10-CM | POA: Diagnosis not present

## 2021-03-18 DIAGNOSIS — Z452 Encounter for adjustment and management of vascular access device: Secondary | ICD-10-CM | POA: Diagnosis not present

## 2021-03-18 DIAGNOSIS — R34 Anuria and oliguria: Secondary | ICD-10-CM | POA: Diagnosis not present

## 2021-03-18 DIAGNOSIS — E1165 Type 2 diabetes mellitus with hyperglycemia: Secondary | ICD-10-CM | POA: Diagnosis not present

## 2021-03-18 DIAGNOSIS — R918 Other nonspecific abnormal finding of lung field: Secondary | ICD-10-CM | POA: Diagnosis not present

## 2021-03-18 DIAGNOSIS — Z794 Long term (current) use of insulin: Secondary | ICD-10-CM | POA: Diagnosis not present

## 2021-03-18 DIAGNOSIS — E1122 Type 2 diabetes mellitus with diabetic chronic kidney disease: Secondary | ICD-10-CM | POA: Diagnosis not present

## 2021-03-18 DIAGNOSIS — Z4682 Encounter for fitting and adjustment of non-vascular catheter: Secondary | ICD-10-CM | POA: Diagnosis not present

## 2021-03-18 DIAGNOSIS — N189 Chronic kidney disease, unspecified: Secondary | ICD-10-CM | POA: Diagnosis not present

## 2021-03-18 DIAGNOSIS — Z01818 Encounter for other preprocedural examination: Secondary | ICD-10-CM | POA: Diagnosis not present

## 2021-03-18 DIAGNOSIS — I214 Non-ST elevation (NSTEMI) myocardial infarction: Secondary | ICD-10-CM | POA: Diagnosis not present

## 2021-03-18 DIAGNOSIS — E042 Nontoxic multinodular goiter: Secondary | ICD-10-CM | POA: Diagnosis not present

## 2021-03-18 DIAGNOSIS — I25118 Atherosclerotic heart disease of native coronary artery with other forms of angina pectoris: Secondary | ICD-10-CM | POA: Diagnosis not present

## 2021-03-18 DIAGNOSIS — D649 Anemia, unspecified: Secondary | ICD-10-CM | POA: Diagnosis not present

## 2021-03-18 DIAGNOSIS — I25119 Atherosclerotic heart disease of native coronary artery with unspecified angina pectoris: Secondary | ICD-10-CM | POA: Diagnosis not present

## 2021-03-18 DIAGNOSIS — I10 Essential (primary) hypertension: Secondary | ICD-10-CM | POA: Diagnosis not present

## 2021-03-18 DIAGNOSIS — J90 Pleural effusion, not elsewhere classified: Secondary | ICD-10-CM | POA: Diagnosis not present

## 2021-03-18 DIAGNOSIS — J9811 Atelectasis: Secondary | ICD-10-CM | POA: Diagnosis not present

## 2021-03-18 DIAGNOSIS — M47814 Spondylosis without myelopathy or radiculopathy, thoracic region: Secondary | ICD-10-CM | POA: Diagnosis not present

## 2021-03-18 DIAGNOSIS — I517 Cardiomegaly: Secondary | ICD-10-CM | POA: Diagnosis not present

## 2021-03-18 DIAGNOSIS — N184 Chronic kidney disease, stage 4 (severe): Secondary | ICD-10-CM | POA: Diagnosis not present

## 2021-03-18 DIAGNOSIS — Z9889 Other specified postprocedural states: Secondary | ICD-10-CM | POA: Diagnosis not present

## 2021-03-18 DIAGNOSIS — I482 Chronic atrial fibrillation, unspecified: Secondary | ICD-10-CM | POA: Diagnosis not present

## 2021-03-18 DIAGNOSIS — Z951 Presence of aortocoronary bypass graft: Secondary | ICD-10-CM | POA: Diagnosis not present

## 2021-03-18 DIAGNOSIS — I2510 Atherosclerotic heart disease of native coronary artery without angina pectoris: Secondary | ICD-10-CM | POA: Diagnosis not present

## 2021-03-18 DIAGNOSIS — J984 Other disorders of lung: Secondary | ICD-10-CM | POA: Diagnosis not present

## 2021-03-18 DIAGNOSIS — N179 Acute kidney failure, unspecified: Secondary | ICD-10-CM | POA: Diagnosis not present

## 2021-03-18 HISTORY — PX: LEFT HEART CATH AND CORONARY ANGIOGRAPHY: CATH118249

## 2021-03-18 LAB — BASIC METABOLIC PANEL
Anion gap: 5 (ref 5–15)
BUN: 34 mg/dL — ABNORMAL HIGH (ref 8–23)
CO2: 25 mmol/L (ref 22–32)
Calcium: 8.5 mg/dL — ABNORMAL LOW (ref 8.9–10.3)
Chloride: 108 mmol/L (ref 98–111)
Creatinine, Ser: 1.48 mg/dL — ABNORMAL HIGH (ref 0.44–1.00)
GFR, Estimated: 36 mL/min — ABNORMAL LOW (ref >60)
Glucose, Bld: 156 mg/dL — ABNORMAL HIGH (ref 70–99)
Potassium: 3.6 mmol/L (ref 3.5–5.1)
Sodium: 138 mmol/L (ref 135–145)

## 2021-03-18 LAB — CBC
HCT: 30 % — ABNORMAL LOW (ref 36.0–46.0)
Hemoglobin: 10 g/dL — ABNORMAL LOW (ref 12.0–15.0)
MCH: 32.5 pg (ref 26.0–34.0)
MCHC: 33.3 g/dL (ref 30.0–36.0)
MCV: 97.4 fL (ref 80.0–100.0)
Platelets: 139 10*3/uL — ABNORMAL LOW (ref 150–400)
RBC: 3.08 MIL/uL — ABNORMAL LOW (ref 3.87–5.11)
RDW: 12.2 % (ref 11.5–15.5)
WBC: 13.6 10*3/uL — ABNORMAL HIGH (ref 4.0–10.5)
nRBC: 0 % (ref 0.0–0.2)

## 2021-03-18 LAB — HEMOGLOBIN A1C
Hgb A1c MFr Bld: 9.2 % — ABNORMAL HIGH (ref 4.8–5.6)
Mean Plasma Glucose: 217 mg/dL

## 2021-03-18 LAB — HEPARIN LEVEL (UNFRACTIONATED): Heparin Unfractionated: 0.23 IU/mL — ABNORMAL LOW (ref 0.30–0.70)

## 2021-03-18 LAB — GLUCOSE, CAPILLARY
Glucose-Capillary: 193 mg/dL — ABNORMAL HIGH (ref 70–99)
Glucose-Capillary: 180 mg/dL — ABNORMAL HIGH (ref 70–99)
Glucose-Capillary: 197 mg/dL — ABNORMAL HIGH (ref 70–99)
Glucose-Capillary: 314 mg/dL — ABNORMAL HIGH (ref 70–99)

## 2021-03-18 LAB — MAGNESIUM: Magnesium: 2.3 mg/dL (ref 1.7–2.4)

## 2021-03-18 SURGERY — LEFT HEART CATH AND CORONARY ANGIOGRAPHY
Anesthesia: Moderate Sedation

## 2021-03-18 MED ORDER — ROSUVASTATIN CALCIUM 20 MG PO TABS: 20.0000 mg | ORAL_TABLET | Freq: Every day | ORAL | Status: AC

## 2021-03-18 MED ORDER — MIDAZOLAM HCL 2 MG/2ML IJ SOLN
INTRAMUSCULAR | Status: AC
  Filled 2021-03-18: qty 2

## 2021-03-18 MED ORDER — SODIUM CHLORIDE 0.9 % IV SOLN: 250.0000 mL | INTRAVENOUS | Status: DC | PRN

## 2021-03-18 MED ORDER — LIDOCAINE HCL (PF) 1 % IJ SOLN
INTRAMUSCULAR | Status: DC | PRN
  Administered 2021-03-18: 20 mL

## 2021-03-18 MED ORDER — LIDOCAINE HCL 1 % IJ SOLN
INTRAMUSCULAR | Status: AC
  Filled 2021-03-18: qty 20

## 2021-03-18 MED ORDER — IOHEXOL 350 MG/ML SOLN
INTRAVENOUS | Status: DC | PRN
  Administered 2021-03-18: 50 mL

## 2021-03-18 MED ORDER — SODIUM CHLORIDE 0.9 % WEIGHT BASED INFUSION
3.0000 mL/kg/h | INTRAVENOUS | Status: DC
  Administered 2021-03-18: 3 mL/kg/h via INTRAVENOUS

## 2021-03-18 MED ORDER — HEPARIN (PORCINE) 25000 UT/250ML-% IV SOLN: 1200.0000 [IU]/h | INTRAVENOUS | Status: AC

## 2021-03-18 MED ORDER — ASPIRIN 81 MG PO CHEW
81.0000 mg | CHEWABLE_TABLET | ORAL | Status: AC
  Administered 2021-03-18: 81 mg via ORAL

## 2021-03-18 MED ORDER — HEPARIN (PORCINE) IN NACL 1000-0.9 UT/500ML-% IV SOLN
INTRAVENOUS | Status: AC
  Filled 2021-03-18: qty 1000

## 2021-03-18 MED ORDER — SODIUM CHLORIDE 0.9% FLUSH: 3.0000 mL | INTRAVENOUS | Status: DC | PRN

## 2021-03-18 MED ORDER — INSULIN DETEMIR 100 UNIT/ML ~~LOC~~ SOLN
20.0000 [IU] | Freq: Two times a day (BID) | SUBCUTANEOUS | Status: DC
  Administered 2021-03-18: 20 [IU] via SUBCUTANEOUS
  Filled 2021-03-18 (×3): qty 0.2

## 2021-03-18 MED ORDER — LABETALOL HCL 5 MG/ML IV SOLN: 10.0000 mg | INTRAVENOUS | Status: DC | PRN

## 2021-03-18 MED ORDER — SODIUM CHLORIDE 0.9 % WEIGHT BASED INFUSION: 1.0000 mL/kg/h | INTRAVENOUS | Status: DC

## 2021-03-18 MED ORDER — ASPIRIN 81 MG PO CHEW: 81.0000 mg | CHEWABLE_TABLET | ORAL | Status: DC

## 2021-03-18 MED ORDER — ASPIRIN 81 MG PO CHEW
CHEWABLE_TABLET | ORAL | Status: AC
  Filled 2021-03-18: qty 1

## 2021-03-18 MED ORDER — HEPARIN BOLUS VIA INFUSION
1100.0000 [IU] | Freq: Once | INTRAVENOUS | Status: AC
  Administered 2021-03-18: 1100 [IU] via INTRAVENOUS
  Filled 2021-03-18: qty 1100

## 2021-03-18 MED ORDER — ONDANSETRON HCL 4 MG/2ML IJ SOLN: 4.0000 mg | Freq: Four times a day (QID) | INTRAMUSCULAR | Status: DC | PRN

## 2021-03-18 MED ORDER — ACETAMINOPHEN 325 MG PO TABS: 650.0000 mg | ORAL_TABLET | ORAL | Status: DC | PRN

## 2021-03-18 MED ORDER — METOPROLOL TARTRATE 25 MG PO TABS: 12.5000 mg | ORAL_TABLET | Freq: Two times a day (BID) | ORAL | Status: AC

## 2021-03-18 MED ORDER — FENTANYL CITRATE (PF) 100 MCG/2ML IJ SOLN
INTRAMUSCULAR | Status: AC
  Filled 2021-03-18: qty 2

## 2021-03-18 MED ORDER — HEPARIN (PORCINE) IN NACL 1000-0.9 UT/500ML-% IV SOLN
INTRAVENOUS | Status: DC | PRN
  Administered 2021-03-18 (×2): 500 mL

## 2021-03-18 MED ORDER — HYDRALAZINE HCL 20 MG/ML IJ SOLN: 10.0000 mg | INTRAMUSCULAR | Status: DC | PRN

## 2021-03-18 SURGICAL SUPPLY — 12 items
CATH INFINITI 5 FR 3DRC (CATHETERS) ×2 IMPLANT
CATH INFINITI 5FR JL4 (CATHETERS) ×2 IMPLANT
CATH INFINITI JR4 5F (CATHETERS) ×2 IMPLANT
DEVICE CLOSURE MYNXGRIP 5F (Vascular Products) ×2 IMPLANT
DRAPE BRACHIAL (DRAPES) ×2 IMPLANT
NEEDLE PERC 18GX7CM (NEEDLE) ×2 IMPLANT
PACK CARDIAC CATH (CUSTOM PROCEDURE TRAY) ×2 IMPLANT
PROTECTION STATION PRESSURIZED (MISCELLANEOUS) ×2
SET ATX SIMPLICITY (MISCELLANEOUS) ×2 IMPLANT
SHEATH AVANTI 5FR X 11CM (SHEATH) ×2 IMPLANT
STATION PROTECTION PRESSURIZED (MISCELLANEOUS) ×1 IMPLANT
WIRE GUIDERIGHT .035X150 (WIRE) ×2 IMPLANT

## 2021-03-18 NOTE — Assessment & Plan Note (Signed)
Patient was admitted with HsTrop >7000, started on heparin drip, aspirin.  Cardiology consulted.    Lovastatin switched to Crestor.  Low dose BB started.  Echo showed reduced EF.   LHC on 03/18/2021 showed three vessel disease.  Bypass evaluation was recommended.  Transfer arranged by Dr. Nehemiah Massed to [facility].

## 2021-03-18 NOTE — Discharge Summary (Signed)
Physician Discharge Summary   Patient name: Mckenzie Wright  Admit date:     03/16/2021  Discharge date: 03/18/2021  Attending Physician: Cherylann Ratel  Discharge Physician: Edwin Dada   PCP: Kirk Ruths, MD        Follow-up Information     Corey Skains, MD Follow up in 1 week(s).   Specialty: Cardiology Contact information: [address] [place] [place] [address] [phone]                   Discharge Diagnoses Principal Problem:   NSTEMI (non-ST elevated myocardial infarction) [facility]) Active Problems:   CAD (coronary artery disease)   Essential hypertension   Acquired hypothyroidism   Anemia   CKD (chronic kidney disease), stage IV (HCC)   Type 2 diabetes mellitus with stage 4 chronic kidney disease, with long-term current use of insulin ([place])   Hypomagnesemia         Hospital Course   [name] is a 75 y.o. F with DM, HTN, asthma and hx stroke without residuals who presented with chest pain and shortness of breath, waxing and waning over several days, now more severe.  In the ER, ECG showed Q waves and anterior TWI.  Troponin >6000 and trended down.  Cr 1.9.  Glucose 600.   10/1 Started on heparin and admitted for NSTEMI 10/3 LHC showed 3V disease, Cardiology recommended evaluation for coronary bypass grafting       * NSTEMI (non-ST elevated myocardial infarction) [facility]) Patient was admitted with HsTrop >7000, started on heparin drip, aspirin.  Cardiology consulted.    Lovastatin switched to Crestor.  Low dose BB started.  Echo showed reduced EF.   LHC on 03/18/2021 showed three vessel disease.  Bypass evaluation was recommended.  Transfer arranged by Dr. Nehemiah Massed to [facility].   Essential hypertension    CAD (coronary artery disease) Patient's ARB was held while getting IV contrast.    Metoprolol started.  Aspirin and Crestor.  Type 2 diabetes mellitus with stage 4  chronic kidney disease, with long-term current use of insulin ([place]) Patient arrived with glucose >600 mg/dL.  Treated with subCu insulin, resolved to normal.  Take ~75 units of 70/30 NPH/regular daily total.    Here controlled well with Levemir 20 BID and SS corrections.     CKD (chronic kidney disease), stage IV (HCC) Baseline Cr 1.5-1.8, stable here relative to baseline  Anemia Anemia of CKD IV.  Hgb at baseline  Acquired hypothyroidism -Continue levothyroxine  Hypomagnesemia Treated and resolved.         Procedures performed:  Echo 10/2 IMPRESSIONS     1. Left ventricular ejection fraction, by estimation, is 40 to 45%. The  left ventricle has mildly decreased function. The left ventricle  demonstrates regional wall motion abnormalities (see scoring  diagram/findings for description). The left ventricular   internal cavity size was mildly dilated. Left ventricular diastolic  parameters were normal.   2. Right ventricular systolic function is normal. The right ventricular  size is normal.   3. The mitral valve is normal in structure. Mild mitral valve  regurgitation.   4. The aortic valve is normal in structure. Aortic valve regurgitation is  not visualized.     Left heart cath 10/3 Prox RCA lesion is 100% stenosed.   Dist RCA lesion is 100% stenosed.   Ramus lesion is 85% stenosed.   Mid LAD lesion is 90% stenosed.   LV end diastolic  pressure is normal.   75 year old female with diabetes hypertension hyperlipidemia and possible previous inferior myocardial infarction having a non-ST elevation myocardial infarction   Echocardiogram showing mild LV systolic dysfunction with inferior hypokinesis and ejection fraction of 40%   100% occlusion of proximal right coronary artery with collaterals to PDA 90% stenosis of proximal left anterior descending artery 85% stenosis of proximal ramus   Assessment Significant three-vessel coronary artery disease with non-ST  elevation myocardial infarction diabetes hypertension hyperlipidemia needing further discussion for the possibility of coronary artery bypass grafting versus possible PCI and stent placement of left anterior descending artery with further medical management of other lesions depending on patient's wishes High intensity cholesterol therapy Beta-blocker and ACE inhibitor for LV systolic dysfunction Cardiac rehabilitation       Condition at discharge: stable  Exam Physical Exam Constitutional:      General: She is not in acute distress.    Appearance: Normal appearance. She is not toxic-appearing.  HENT:     Mouth/Throat:     Mouth: Mucous membranes are dry.     Pharynx: Oropharynx is clear. No oropharyngeal exudate.     Comments: Dentures in place, lips normal Cardiovascular:     Rate and Rhythm: Normal rate and regular rhythm.     Heart sounds: No murmur heard.   No gallop.  Pulmonary:     Effort: Pulmonary effort is normal.     Breath sounds: Normal breath sounds. No wheezing or rales.  Abdominal:     General: Abdomen is flat.     Palpations: Abdomen is soft.     Tenderness: There is no abdominal tenderness. There is no guarding.  Skin:    General: Skin is warm and dry.     Findings: No rash.  Neurological:     General: No focal deficit present.     Mental Status: She is alert and oriented to person, place, and time.     Comments: Hard of hearing  Psychiatric:        Mood and Affect: Mood normal.        Behavior: Behavior normal.        Thought Content: Thought content normal.      Disposition:  Transfer to another hospital  Discharge time: greater than 30 minutes. Allergies as of 03/18/2021   No Known Allergies      Medication List     STOP taking these medications    insulin NPH Human 100 UNIT/ML injection Commonly known as: NOVOLIN N   lisinopril-hydrochlorothiazide 20-12.5 MG tablet Commonly known as: ZESTORETIC   lovastatin 40 MG tablet Commonly  known as: MEVACOR       TAKE these medications    aspirin EC 81 MG tablet Take 81 mg by mouth daily.   Calcium Carbonate-Vitamin D 600-200 MG-UNIT Tabs Take by mouth.   heparin 25000 UT/250ML infusion Inject 1,200 Units/hr into the vein continuous.   insulin NPH-regular Human (70-30) 100 UNIT/ML injection Inject 50 Units into the skin daily with breakfast.   insulin NPH-regular Human (70-30) 100 UNIT/ML injection Inject 25 Units into the skin daily with supper.   levothyroxine 88 MCG tablet Commonly known as: SYNTHROID Take by mouth. What changed: Another medication with the same name was removed. Continue taking this medication, and follow the directions you see here.   metoprolol tartrate 25 MG tablet Commonly known as: LOPRESSOR Take 0.5 tablets (12.5 mg total) by mouth 2 (two) times daily.   rosuvastatin 20 MG tablet Commonly known as:  CRESTOR Take 1 tablet (20 mg total) by mouth daily at 6 (six) AM. Start taking on: March 19, 2021   torsemide 20 MG tablet Commonly known as: DEMADEX TAKE 1 TABLET BY MOUTH ONCE DAILY IN THE MORNING AS NEEDED FOR EDEMA IN LEGS   vitamin B-12 1000 MCG tablet Commonly known as: CYANOCOBALAMIN Take 1,000 mcg by mouth daily.        DG Chest 1 View  Result Date: 03/17/2021 CLINICAL DATA:  Weakness and fatigue. EXAM: CHEST  1 VIEW COMPARISON:  May 07, 2009 FINDINGS: Mild, diffusely increased interstitial lung markings are seen. There is no evidence of a pleural effusion or pneumothorax. The heart size and mediastinal contours are within normal limits. There is mild calcification of the aortic arch. Degenerative changes are noted throughout the thoracic spine. IMPRESSION: Findings suggestive of mild interstitial edema. Electronically Signed   By: Virgina Norfolk M.D.   On: 03/17/2021 00:33   CARDIAC CATHETERIZATION  Result Date: 03/18/2021   Prox RCA lesion is 100% stenosed.   Dist RCA lesion is 100% stenosed.   Ramus lesion  is 85% stenosed.   Mid LAD lesion is 90% stenosed.   LV end diastolic pressure is normal. 75 year old female with diabetes hypertension hyperlipidemia and possible previous inferior myocardial infarction having a non-ST elevation myocardial infarction Echocardiogram showing mild LV systolic dysfunction with inferior hypokinesis and ejection fraction of 40% 100% occlusion of proximal right coronary artery with collaterals to PDA 90% stenosis of proximal left anterior descending artery 85% stenosis of proximal ramus Assessment Significant three-vessel coronary artery disease with non-ST elevation myocardial infarction diabetes hypertension hyperlipidemia needing further discussion for the possibility of coronary artery bypass grafting versus possible PCI and stent placement of left anterior descending artery with further medical management of other lesions depending on patient's wishes High intensity cholesterol therapy Beta-blocker and ACE inhibitor for LV systolic dysfunction Cardiac rehabilitation   ECHOCARDIOGRAM COMPLETE  Result Date: 03/17/2021    ECHOCARDIOGRAM REPORT   Patient Name:   KAYTLINN OPSAHL Date of Exam: 03/17/2021 Medical Rec #:  [id number]     Height:       67.0 in Accession #:    [id number]    Weight:       161.6 lb Date of Birth:  September 24, 1945     BSA:          1.847 m Patient Age:    1 years      BP:           118/62 mmHg Patient Gender: F             HR:           89 bpm. Exam Location:  [facility] Procedure: 2D Echo and Strain Analysis Indications:     Chest Pain R07.9  History:         Patient has no prior history of Echocardiogram examinations.  Sonographer:     Kathlen Brunswick RDCS Referring Phys:  [place] Diagnosing Phys: Serafina Royals MD  Sonographer Comments: Image acquisition challenging due to uncooperative patient. Global longitudinal strain was attempted. IMPRESSIONS  1. Left ventricular ejection fraction, by estimation, is 40 to 45%. The left ventricle has mildly decreased  function. The left ventricle demonstrates regional wall motion abnormalities (see scoring diagram/findings for description). The left ventricular  internal cavity size was mildly dilated. Left ventricular diastolic parameters were normal.  2. Right ventricular systolic function is normal. The right ventricular size is normal.  3. The  mitral valve is normal in structure. Mild mitral valve regurgitation.  4. The aortic valve is normal in structure. Aortic valve regurgitation is not visualized. FINDINGS  Left Ventricle: Left ventricular ejection fraction, by estimation, is 40 to 45%. The left ventricle has mildly decreased function. The left ventricle demonstrates regional wall motion abnormalities. Severe hypokinesis of the left ventricular, mid inferoseptal wall and inferior wall. The left ventricular internal cavity size was mildly dilated. There is no left ventricular hypertrophy. Left ventricular diastolic parameters were normal. Right Ventricle: The right ventricular size is normal. No increase in right ventricular wall thickness. Right ventricular systolic function is normal. Left Atrium: Left atrial size was normal in size. Right Atrium: Right atrial size was normal in size. Pericardium: There is no evidence of pericardial effusion. Mitral Valve: The mitral valve is normal in structure. Mild mitral valve regurgitation. Tricuspid Valve: The tricuspid valve is normal in structure. Tricuspid valve regurgitation is mild. Aortic Valve: The aortic valve is normal in structure. Aortic valve regurgitation is not visualized. Aortic valve peak gradient measures 6.7 mmHg. Pulmonic Valve: The pulmonic valve was normal in structure. Pulmonic valve regurgitation is not visualized. Aorta: The aortic root and ascending aorta are structurally normal, with no evidence of dilitation. IAS/Shunts: No atrial level shunt detected by color flow Doppler.  LEFT VENTRICLE PLAX 2D LVIDd:         4.20 cm     Diastology LVIDs:         3.30  cm     LV e' medial:    5.44 cm/s LV PW:         1.10 cm     LV E/e' medial:  11.1 LV IVS:        1.00 cm     LV e' lateral:   6.96 cm/s LVOT diam:     1.80 cm     LV E/e' lateral: 8.7 LV SV:         31 LV SV Index:   17 LVOT Area:     2.54 cm  LV Volumes (MOD) LV vol d, MOD A4C: 83.8 ml LV vol s, MOD A4C: 32.9 ml LV SV MOD A4C:     83.8 ml RIGHT VENTRICLE RV Basal diam:  3.30 cm RV S prime:     12.00 cm/s TAPSE (M-mode): 1.8 cm LEFT ATRIUM             Index       RIGHT ATRIUM           Index LA diam:        3.20 cm 1.73 cm/m  RA Area:     14.10 cm LA Vol (A2C):   38.6 ml 20.90 ml/m RA Volume:   41.20 ml  22.31 ml/m LA Vol (A4C):   23.6 ml 12.78 ml/m LA Biplane Vol: 31.5 ml 17.05 ml/m  AORTIC VALVE                PULMONIC VALVE AV Area (Vmax): 1.46 cm    PV Vmax:       0.88 m/s AV Vmax:        129.00 cm/s PV Peak grad:  3.1 mmHg AV Peak Grad:   6.7 mmHg LVOT Vmax:      73.80 cm/s LVOT Vmean:     51.400 cm/s LVOT VTI:       0.122 m  AORTA Ao Root diam: 2.50 cm Ao Asc diam:  2.70 cm MITRAL VALVE  TRICUSPID VALVE MV Area (PHT): 6.60 cm    TV Peak grad:   21.0 mmHg MV Decel Time: 115 msec    TV Vmax:        2.29 m/s MV E velocity: 60.40 cm/s MV A velocity: 86.10 cm/s  SHUNTS MV E/A ratio:  0.70        Systemic VTI:  0.12 m                            Systemic Diam: 1.80 cm Serafina Royals MD Electronically signed by Serafina Royals MD Signature Date/Time: 03/17/2021/2:09:37 PM    Final    Results for orders placed or performed during the hospital encounter of 03/16/21  Resp Panel by RT-PCR (Flu A&B, Covid) Nasopharyngeal Swab     Status: None   Collection Time: 03/17/21  1:30 AM   Specimen: Nasopharyngeal Swab; Nasopharyngeal(NP) swabs in vial transport medium  Result Value Ref Range Status   SARS Coronavirus 2 by RT PCR NEGATIVE NEGATIVE Final    Comment: (NOTE) SARS-CoV-2 target nucleic acids are NOT DETECTED.  The SARS-CoV-2 RNA is generally detectable in upper respiratory specimens  during the acute phase of infection. The lowest concentration of SARS-CoV-2 viral copies this assay can detect is 138 copies/mL. A negative result does not preclude SARS-Cov-2 infection and should not be used as the sole basis for treatment or other patient management decisions. A negative result may occur with  improper specimen collection/handling, submission of specimen other than nasopharyngeal swab, presence of viral mutation(s) within the areas targeted by this assay, and inadequate number of viral copies(<138 copies/mL). A negative result must be combined with clinical observations, patient history, and epidemiological information. The expected result is Negative.  Fact Sheet for Patients:  EntrepreneurPulse.com.au  Fact Sheet for Healthcare Providers:  IncredibleEmployment.be  This test is no t yet approved or cleared by the [place] FDA and  has been authorized for detection and/or diagnosis of SARS-CoV-2 by FDA under an Emergency Use Authorization (EUA). This EUA will remain  in effect (meaning this test can be used) for the duration of the COVID-19 declaration under Section 564(b)(1) of the Act, 21 U.S.C.section 360bbb-3(b)(1), unless the authorization is terminated  or revoked sooner.       Influenza A by PCR NEGATIVE NEGATIVE Final   Influenza B by PCR NEGATIVE NEGATIVE Final    Comment: (NOTE) The Xpert Xpress SARS-CoV-2/FLU/RSV plus assay is intended as an aid in the diagnosis of influenza from Nasopharyngeal swab specimens and should not be used as a sole basis for treatment. Nasal washings and aspirates are unacceptable for Xpert Xpress SARS-CoV-2/FLU/RSV testing.  Fact Sheet for Patients: EntrepreneurPulse.com.au  Fact Sheet for Healthcare Providers: IncredibleEmployment.be  This test is not yet approved or cleared by the [place] FDA and has been authorized for detection  and/or diagnosis of SARS-CoV-2 by FDA under an Emergency Use Authorization (EUA). This EUA will remain in effect (meaning this test can be used) for the duration of the COVID-19 declaration under Section 564(b)(1) of the Act, 21 U.S.C. section 360bbb-3(b)(1), unless the authorization is terminated or revoked.  Performed at [facility], [address] [place], [place] [address]     Signed:  Edwin Dada MD.  [facility] 03/18/2021, 2:52 PM

## 2021-03-18 NOTE — Assessment & Plan Note (Signed)
Anemia of CKD IV.  Hgb at baseline

## 2021-03-18 NOTE — Assessment & Plan Note (Signed)
Baseline Cr 1.5-1.8, stable here relative to baseline

## 2021-03-18 NOTE — Assessment & Plan Note (Signed)
Patient's ARB was held while getting IV contrast.    Metoprolol started.  Aspirin and Crestor.

## 2021-03-18 NOTE — Assessment & Plan Note (Signed)
Continue levothyroxine 

## 2021-03-18 NOTE — Progress Notes (Addendum)
[place] for Heparin  Indication: chest pain/ACS   Patient Measurements: Heparin Dosing Weight: 73.3 kg   Labs: Recent Labs    03/16/21 2200 03/17/21 0153 03/17/21 0547 03/17/21 0750 03/17/21 1131 03/17/21 2041 03/18/21 0502  HGB 11.4*  --  10.8*  --   --   --  10.0*  HCT 34.2*  --  31.7*  --   --   --  30.0*  PLT 163  --  153  --   --   --  139*  APTT  --  26  --   --   --   --   --   LABPROT  --  14.7  --   --   --   --   --   INR  --  1.2  --   --   --   --   --   HEPARINUNFRC  --   --   --   --  0.27* 0.43 0.23*  CREATININE 1.98*  --  1.52*  --   --   --  1.48*  CKTOTAL  --   --  262*  --   --   --   --   TROPONINIHS [id number]* 5,154* 5,157*  --   --   --      Estimated Creatinine Clearance: 31 mL/min (A) (by C-G formula based on SCr of 1.48 mg/dL (H)).   Medical History: Past Medical History:  Diagnosis Date   Asthma    Diabetes ([place])    Hypertension    Stroke [facility])     Assessment: 75yo F with PMH of DM, HTN, asthma and hx stroke without residuals who was admitted to the hospital for NSTEMI. Pharmacy consulted to dose heparin.  No prior anticoag noted.   Troponin [phone]  Heparin infusion was stopped when pt was taken to cath lab and pt was found to have extensive multi-vessel disease, making her eligible for CABG. Pt agreed to proceed and will be transferred for procedure.   Heparin was restarted at 1138  Date Time   HL Rate/Comment 10/02 1131 0.27 Subthera; 900>1050 un/hr  10/02 2041 0.43 Therapeutic, 1050 un/hr  10/03  0502    0.23     Subtherapeutic    Goal of Therapy:  Heparin level 0.3-0.7 units/ml Monitor platelets by anticoagulation protocol: Yes   Plan:  --Will continue heparin at 1200 units/hr and recheck a heparin level 8h after restart (order placed for 2000) --Daily CBC while at [facility] and on heparin infusion  Narda Rutherford, PharmD Pharmacy Resident  03/18/2021 3:44 PM

## 2021-03-18 NOTE — Assessment & Plan Note (Signed)
Treated and resolved °

## 2021-03-18 NOTE — Progress Notes (Addendum)
Patient came back from Cath at 9:33 am. Report received from Carlynn Spry, RN.  Report from nurse has her vitals: BP 117/60. No sedation. Aspirin 81 mg given. Good pulses. Insertion point on Right  groin: intact, no swelling or bleeding. Good pulses on both extremities, but Right pulses are slightly weaker than left.  Patient and children have agreed for her to have bypass surgery instead of a stent. Coronary disease: 85% occlusion on circumflex, 90% on LAD and 100% occlusion on RCA.  Heparin to be started at 10:45 am. Awaiting orders. Doctor contacted.  Vitals post cath on unit: Pulse: 76, BP 116/59, MAP 77, 98 Sat on Room air, temp is 98.7 and RR 15. BP taken on LA and lying in bed.  Heparin administered per doctor's orders. See MAR. Patient to be transferred to [facility] for Surgery.

## 2021-03-18 NOTE — Progress Notes (Signed)
[facility] Encounter Note  Patient: Mckenzie Wright / Admit Date: 03/16/2021 / Date of Encounter: 03/18/2021, 8:30 AM   Subjective: Patient is overall done fairly well since admission with no evidence of significant new chest discomfort or shortness of breath.  Troponin peak at 6466 consistent with non-ST elevation myocardial infarction.  Echocardiogram showing mild LV systolic dysfunction with inferior hypokinesis consistent with previous myocardial infarction and mild valvular heart disease  Cardiac catheterization shows occluded proximal right coronary artery with collaterals to the PDA significant 85% stenosis of ramus and long segment of 95% stenosis of LAD which appears to be the culprit artery.  Review of Systems: Positive for: None Negative for: Vision change, hearing change, syncope, dizziness, nausea, vomiting,diarrhea, bloody stool, stomach pain, cough, congestion, diaphoresis, urinary frequency, urinary pain,skin lesions, skin rashes Others previously listed  Objective: Telemetry: Normal sinus rhythm with none previous myocardial infarction hypertension hyperlipidemia dominant Physical Exam: Blood pressure 120/82, pulse 89, temperature 98.9 F (37.2 C), temperature source Oral, resp. rate (!) 22, height '5\' 7"'$  (1.702 m), weight 73.3 kg, SpO2 92 %. Body mass index is 25.31 kg/m. General: Well developed, well nourished, in no acute distress. Head: Normocephalic, atraumatic, sclera non-icteric, no xanthomas, nares are without discharge. Neck: No apparent masses Lungs: Normal respirations with no wheezes, no rhonchi, no rales , no crackles   Heart: Regular rate and rhythm, normal S1 S2, no murmur, no rub, no gallop, PMI is normal size and placement, carotid upstroke normal without bruit, jugular venous pressure normal Abdomen: Soft, non-tender, non-distended with normoactive bowel sounds. No hepatosplenomegaly. Abdominal aorta is normal size without  bruit Extremities: No edema, no clubbing, no cyanosis, no ulcers,  Peripheral: 2+ radial, 2+ femoral, 2+ dorsal pedal pulses Neuro: Alert and oriented. Moves all extremities spontaneously. Psych:  Responds to questions appropriately with a normal affect.   Intake/Output Summary (Last 24 hours) at 03/18/2021 0830 Last data filed at 03/17/2021 1811 Gross per 24 hour  Intake 844.95 ml  Output --  Net 844.95 ml    Inpatient Medications:   aspirin       [MAR Hold] aspirin EC  81 mg Oral Daily   [MAR Hold] insulin aspart  0-15 Units Subcutaneous Q4H   [MAR Hold] levothyroxine  88 mcg Oral Q0600   [MAR Hold] metoprolol tartrate  12.5 mg Oral BID   [MAR Hold] pantoprazole (PROTONIX) IV  40 mg Intravenous Q24H   [MAR Hold] rosuvastatin  20 mg Oral Q0600   [MAR Hold] sodium chloride flush  3 mL Intravenous Q12H   Infusions:   sodium chloride     [START ON 03/19/2021] sodium chloride 3 mL/kg/hr (03/18/21 0729)   Followed by   Derrill Memo ON 03/19/2021] sodium chloride     heparin 1,200 Units/hr (03/18/21 0638)    Labs: Recent Labs    03/17/21 0547 03/18/21 0502  NA 137 138  K 3.8 3.6  CL 105 108  CO2 25 25  GLUCOSE 388* 156*  BUN 37* 34*  CREATININE 1.52* 1.48*  CALCIUM 9.3 8.5*  MG 1.6* 2.3   Recent Labs    03/17/21 0547  AST 63*  ALT 57*  ALKPHOS 94  BILITOT 1.4*  PROT 6.7  ALBUMIN 3.5   Recent Labs    03/16/21 2200 03/17/21 0547 03/18/21 0502  WBC 16.9* 16.8* 13.6*  NEUTROABS 13.7*  --   --   HGB 11.4* 10.8* 10.0*  HCT 34.2* 31.7* 30.0*  MCV 95.8 96.4 97.4  PLT 163 153 139*  Recent Labs    03/17/21 0547  CKTOTAL 262*   Invalid input(s): POCBNP Recent Labs    03/16/21 2200  HGBA1C 9.2*     Weights: Filed Weights   03/17/21 0139 03/17/21 0717  Weight: 73.3 kg 73.3 kg     Radiology/Studies:  DG Chest 1 View  Result Date: 03/17/2021 CLINICAL DATA:  Weakness and fatigue. EXAM: CHEST  1 VIEW COMPARISON:  May 07, 2009 FINDINGS: Mild, diffusely  increased interstitial lung markings are seen. There is no evidence of a pleural effusion or pneumothorax. The heart size and mediastinal contours are within normal limits. There is mild calcification of the aortic arch. Degenerative changes are noted throughout the thoracic spine. IMPRESSION: Findings suggestive of mild interstitial edema. Electronically Signed   By: Virgina Norfolk M.D.   On: 03/17/2021 00:33   ECHOCARDIOGRAM COMPLETE  Result Date: 03/17/2021    ECHOCARDIOGRAM REPORT   Patient Name:   Mckenzie Wright Date of Exam: 03/17/2021 Medical Rec #:  [id number]     Height:       67.0 in Accession #:    [id number]    Weight:       161.6 lb Date of Birth:  [birth year]     BSA:          1.847 m Patient Age:    63 years      BP:           118/62 mmHg Patient Gender: F             HR:           89 bpm. Exam Location:  [facility] Procedure: 2D Echo and Strain Analysis Indications:     Chest Pain R07.9  History:         Patient has no prior history of Echocardiogram examinations.  Sonographer:     Kathlen Brunswick RDCS Referring Phys:  [place] Diagnosing Phys: Serafina Royals MD  Sonographer Comments: Image acquisition challenging due to uncooperative patient. Global longitudinal strain was attempted. IMPRESSIONS  1. Left ventricular ejection fraction, by estimation, is 40 to 45%. The left ventricle has mildly decreased function. The left ventricle demonstrates regional wall motion abnormalities (see scoring diagram/findings for description). The left ventricular  internal cavity size was mildly dilated. Left ventricular diastolic parameters were normal.  2. Right ventricular systolic function is normal. The right ventricular size is normal.  3. The mitral valve is normal in structure. Mild mitral valve regurgitation.  4. The aortic valve is normal in structure. Aortic valve regurgitation is not visualized. FINDINGS  Left Ventricle: Left ventricular ejection fraction, by estimation, is 40 to 45%. The left  ventricle has mildly decreased function. The left ventricle demonstrates regional wall motion abnormalities. Severe hypokinesis of the left ventricular, mid inferoseptal wall and inferior wall. The left ventricular internal cavity size was mildly dilated. There is no left ventricular hypertrophy. Left ventricular diastolic parameters were normal. Right Ventricle: The right ventricular size is normal. No increase in right ventricular wall thickness. Right ventricular systolic function is normal. Left Atrium: Left atrial size was normal in size. Right Atrium: Right atrial size was normal in size. Pericardium: There is no evidence of pericardial effusion. Mitral Valve: The mitral valve is normal in structure. Mild mitral valve regurgitation. Tricuspid Valve: The tricuspid valve is normal in structure. Tricuspid valve regurgitation is mild. Aortic Valve: The aortic valve is normal in structure. Aortic valve regurgitation is not visualized. Aortic valve peak gradient measures 6.7 mmHg. Pulmonic  Valve: The pulmonic valve was normal in structure. Pulmonic valve regurgitation is not visualized. Aorta: The aortic root and ascending aorta are structurally normal, with no evidence of dilitation. IAS/Shunts: No atrial level shunt detected by color flow Doppler.  LEFT VENTRICLE PLAX 2D LVIDd:         4.20 cm     Diastology LVIDs:         3.30 cm     LV e' medial:    5.44 cm/s LV PW:         1.10 cm     LV E/e' medial:  11.1 LV IVS:        1.00 cm     LV e' lateral:   6.96 cm/s LVOT diam:     1.80 cm     LV E/e' lateral: 8.7 LV SV:         31 LV SV Index:   17 LVOT Area:     2.54 cm  LV Volumes (MOD) LV vol d, MOD A4C: 83.8 ml LV vol s, MOD A4C: 32.9 ml LV SV MOD A4C:     83.8 ml RIGHT VENTRICLE RV Basal diam:  3.30 cm RV S prime:     12.00 cm/s TAPSE (M-mode): 1.8 cm LEFT ATRIUM             Index       RIGHT ATRIUM           Index LA diam:        3.20 cm 1.73 cm/m  RA Area:     14.10 cm LA Vol (A2C):   38.6 ml 20.90 ml/m RA  Volume:   41.20 ml  22.31 ml/m LA Vol (A4C):   23.6 ml 12.78 ml/m LA Biplane Vol: 31.5 ml 17.05 ml/m  AORTIC VALVE                PULMONIC VALVE AV Area (Vmax): 1.46 cm    PV Vmax:       0.88 m/s AV Vmax:        129.00 cm/s PV Peak grad:  3.1 mmHg AV Peak Grad:   6.7 mmHg LVOT Vmax:      73.80 cm/s LVOT Vmean:     51.400 cm/s LVOT VTI:       0.122 m  AORTA Ao Root diam: 2.50 cm Ao Asc diam:  2.70 cm MITRAL VALVE               TRICUSPID VALVE MV Area (PHT): 6.60 cm    TV Peak grad:   21.0 mmHg MV Decel Time: 115 msec    TV Vmax:        2.29 m/s MV E velocity: 60.40 cm/s MV A velocity: 86.10 cm/s  SHUNTS MV E/A ratio:  0.70        Systemic VTI:  0.12 m                            Systemic Diam: 1.80 cm Serafina Royals MD Electronically signed by Serafina Royals MD Signature Date/Time: 03/17/2021/2:09:37 PM    Final      Assessment and Recommendation  75 y.o. female with known diabetes hypertension hyperlipidemia and chronic kidney disease stage IV with acute non-ST elevation myocardial infarction likely secondary to significant stenosis of left anterior descending artery and most appropriate for coronary artery bypass grafting.  We will need discussed with patient and family as to further intervention including the  possibility of PCI and stent placement left anterior descending artery versus coronary to bypass grafting 1.  Discussion with family about options for treatment of significant three-vessel coronary artery disease 2.  Continuation of aspirin beta-blocker ACE inhibitor as able for myocardial infarction 3.  Further treatment options after above  Signed, Serafina Royals M.D. FACC

## 2021-03-18 NOTE — Progress Notes (Signed)
Inpatient Diabetes Program Recommendations  AACE/ADA: New Consensus Statement on Inpatient Glycemic Control (2015)  Target Ranges:  Prepandial:   less than 140 mg/dL      Peak postprandial:   less than 180 mg/dL (1-2 hours)      Critically ill patients:  140 - 180 mg/dL   Results for Mckenzie Wright, Mckenzie Wright (MRN [id number]) as of 03/18/2021 08:15  Ref. Range 03/16/2021 22:00  Glucose Latest Ref Range: 70 - 99 mg/dL [facility])  Results for Mckenzie Wright, Mckenzie Wright (MRN [id number]) as of 03/18/2021 08:15  Ref. Range 03/16/2021 20:59 03/17/2021 02:08 03/17/2021 04:07 03/17/2021 05:49 03/17/2021 07:19 03/17/2021 12:30 03/17/2021 16:26 03/17/2021 20:32  Glucose-Capillary Latest Ref Range: 70 - 99 mg/dL >600 (HH)  10 units Novolog _0  416 (H)  15 units Novolog _1  411 (H)  15 units Novolog _2  369 (H) 312 (H)  11 units Novolog _3   20 units Levemir _4  162 (H)  3 units Novolog _5  169 (H)  3 units Novolog  219 (H)  5 units Novolog   Results for Mckenzie Wright, Mckenzie Wright (MRN [id number]) as of 03/18/2021 08:15  Ref. Range 03/17/2021 23:28 03/18/2021 03:09  Glucose-Capillary Latest Ref Range: 70 - 99 mg/dL 182 (H) 193 (H)  Results for Mckenzie Wright, Mckenzie Wright (MRN [id number]) as of 03/18/2021 08:15  Ref. Range 03/16/2021 22:00  Hemoglobin A1C Latest Ref Range: 4.8 - 5.6 % 9.2 (H)  (217 mg/dl)     Admit with: NSTEMI/ Hyperglycemia  History: DM, CKD  Home DM Meds: NPH Insulin 50 units AM/ 25 units PM (NOT taking)        Regular Insulin 5-15 units TID per SSI  Current Orders: Novolog Moderate Correction Scale/ SSI (0-15 units) Q4 hours     Levemir 20 units BID    Patient saw her PCP Dr. Ouida Sills with Mckenzie Wright on 02/27/2021 --NPH and Regular Insulins were stopped --Patient told to start taking Humulin 70/30 Insulin Pen 50 units AM/ 25 units PM --A1c at that visit was 8.9%  Note Levemir to start this AM    11:45am--Met w/ pt and husband and daughter at bedside this AM.  Reviewed last visit with PCP and  conversion of home NPH and Regular insulins to Humulin 70/30 Insulin BID.  Husband told me pt used up her last NPH and Regular insulins last Wednesday and started the 70/30 Insulin on Thursday.  Noted her CBGs were >400 at home.  Was also having CP at home at the time.  Came to ED on Saturday.  Has been using insulin pens for a long time and has no troubles using the pens, so user error likely not the cause for severe Hyperglycemia on admission.  Discussed with pt and family that Humulin 70/30 insulin is a combination of the 2 insulins pt was using prior (N+R).  Explained how to take and when to take.  Discussed with pt that her PCP likely changed her to 70/30 for ease of dosing at home since it only needs to be dosed BID versus 3-4 times per day.  Explained that pt's severe Hyperglycemia on admission may have been due to the NSTEMI she was having as cardiac events place stress on the body and raise glucose levels.  Discussed with pt that we will give her long and rapid acting insulin in the hospital as 70/30 Insulin not ideal for inpatient use and then when she transfers to [facility] she will likely be placed on an IV Insulin drip for CABG surgery with transition back to SQ  Insulin after surgery.  Also discussed that [facility] will likely make insulin adjustments daily and encouraged pt to follow up with her PCP after discharge from [place] if changes are made to her home insulin regimen.  Encouraged pt to seek care under and ENDO at some point in the future for more specialized diabetes management.  Pt and family appreciative of visit and did not have any further questions for me at this time.    --Will follow patient during hospitalization--  Wyn Quaker RN, MSN, CDE Diabetes Coordinator Inpatient Glycemic Control Team Team Pager: [phone] (8a-5p)

## 2021-03-18 NOTE — Progress Notes (Signed)
Report called to [facility] RN, My Mina Marble. Patients status given to Rn. Packet sent with [facility] life flight. Patient left on RA No distress and no c/o pain. Heparin drip infusing at 33m/hr. Dressing to right groin, clean dry and intact

## 2021-03-18 NOTE — Progress Notes (Signed)
ANTICOAGULATION CONSULT NOTE - Initial Consult  Pharmacy Consult for Heparin  Indication: chest pain/ACS  No Known Allergies  Patient Measurements: Height: '5\' 7"'$  (170.2 cm) Weight: 73.3 kg (161 lb 9.6 oz) IBW/kg (Calculated) : 61.6 Heparin Dosing Weight: 73.3 kg   Vital Signs: Temp: 99.9 F (37.7 C) (10/03 0309) BP: 118/62 (10/03 0309) Pulse Rate: 87 (10/03 0309)  Labs: Recent Labs    03/16/21 2200 03/17/21 0153 03/17/21 0547 03/17/21 0750 03/17/21 1131 03/17/21 2041 03/18/21 0502  HGB 11.4*  --  10.8*  --   --   --  10.0*  HCT 34.2*  --  31.7*  --   --   --  30.0*  PLT 163  --  153  --   --   --  139*  APTT  --  26  --   --   --   --   --   LABPROT  --  14.7  --   --   --   --   --   INR  --  1.2  --   --   --   --   --   HEPARINUNFRC  --   --   --   --  0.27* 0.43 0.23*  CREATININE 1.98*  --  1.52*  --   --   --  1.48*  CKTOTAL  --   --  262*  --   --   --   --   TROPONINIHS [id number]* 5,154* 5,157*  --   --   --      Estimated Creatinine Clearance: 31 mL/min (A) (by C-G formula based on SCr of 1.48 mg/dL (H)).   Medical History: Past Medical History:  Diagnosis Date   Asthma    Diabetes ([place])    Hypertension    Stroke ([place])     Medications:  Medications Prior to Admission  Medication Sig Dispense Refill Last Dose   aspirin EC 81 MG tablet Take 81 mg by mouth daily.   03/16/2021   Calcium Carbonate-Vitamin D 600-200 MG-UNIT TABS Take by mouth.   03/16/2021   insulin NPH Human (NOVOLIN N) 100 UNIT/ML injection INJECT 45 UNITS SUBCUTANEOUSLY IN THE MORNING WITH BREAKFAST AND 28 UNITS IN THE EVENING WITH DINNER (Patient not taking: Reported on 03/17/2021)   Not Taking   levothyroxine (SYNTHROID) 88 MCG tablet Take by mouth.      levothyroxine (SYNTHROID) 88 MCG tablet Take by mouth.      lisinopril-hydrochlorothiazide (ZESTORETIC) 20-12.5 MG tablet Take 1 tablet by mouth daily.      lovastatin (MEVACOR) 40 MG tablet Take 40 mg by mouth at bedtime.       lovastatin (MEVACOR) 40 MG tablet Take by mouth.      torsemide (DEMADEX) 20 MG tablet TAKE 1 TABLET BY MOUTH ONCE DAILY IN THE MORNING AS NEEDED FOR EDEMA IN LEGS      vitamin B-12 (CYANOCOBALAMIN) 1000 MCG tablet Take 1,000 mcg by mouth daily.       Assessment: Pharmacy consulted to dose heparin in this 75 year old female admitted with ACS/NSTEMI. No prior anticoag noted. Heparin Dosing Weight: 73.3 kg   Date Time   HL Rate/Comment 10/02 1131 0.27 Subthera; 900>1050 un/hr  10/02 2041 0.43 Therapeutic, 1050 un/hr  10/02  0502    0.23     Subtherapeutic     Baseline Labs: aPTT - 26s INR - 1.2 Hgb - 11.4>10.8 Plts - 163>153 Trop - 6466> 5157  Goal  of Therapy:  Heparin level 0.3-0.7 units/ml Monitor platelets by anticoagulation protocol: Yes   Plan:  10/3:  HL @ 0502 = 0.28 Will order heparin 1100 units IV X 1 bolus and increase drip rate to 1200 units/hr.  Will recheck HL 8 hrs after rate change.   [place] D, PharmD Clinical Pharmacist 03/18/2021,6:33 AM

## 2021-03-18 NOTE — Assessment & Plan Note (Signed)
Patient arrived with glucose >600 mg/dL.  Treated with subCu insulin, resolved to normal.  Take ~75 units of 70/30 NPH/regular daily total.    Here controlled well with Levemir 20 BID and SS corrections.

## 2021-03-18 NOTE — Plan of Care (Signed)
[facility] transport picked up patient at 4:30 pm. Charge nurse will be contacted with report. Patient Alert and oriented, vitals stable and daughter accompanying transport to [facility].

## 2021-03-19 DIAGNOSIS — I25118 Atherosclerotic heart disease of native coronary artery with other forms of angina pectoris: Secondary | ICD-10-CM | POA: Diagnosis not present

## 2021-03-19 DIAGNOSIS — J9811 Atelectasis: Secondary | ICD-10-CM | POA: Diagnosis not present

## 2021-03-19 DIAGNOSIS — J90 Pleural effusion, not elsewhere classified: Secondary | ICD-10-CM | POA: Diagnosis not present

## 2021-03-19 DIAGNOSIS — Z01818 Encounter for other preprocedural examination: Secondary | ICD-10-CM | POA: Diagnosis not present

## 2021-03-19 DIAGNOSIS — E042 Nontoxic multinodular goiter: Secondary | ICD-10-CM | POA: Diagnosis not present

## 2021-03-19 DIAGNOSIS — I2510 Atherosclerotic heart disease of native coronary artery without angina pectoris: Secondary | ICD-10-CM | POA: Diagnosis not present

## 2021-03-20 DIAGNOSIS — I25118 Atherosclerotic heart disease of native coronary artery with other forms of angina pectoris: Secondary | ICD-10-CM | POA: Diagnosis not present

## 2021-03-21 DIAGNOSIS — J9811 Atelectasis: Secondary | ICD-10-CM | POA: Diagnosis not present

## 2021-03-21 DIAGNOSIS — Z4682 Encounter for fitting and adjustment of non-vascular catheter: Secondary | ICD-10-CM | POA: Diagnosis not present

## 2021-03-21 DIAGNOSIS — J811 Chronic pulmonary edema: Secondary | ICD-10-CM | POA: Diagnosis not present

## 2021-03-21 DIAGNOSIS — Z951 Presence of aortocoronary bypass graft: Secondary | ICD-10-CM | POA: Diagnosis not present

## 2021-03-21 DIAGNOSIS — Z9889 Other specified postprocedural states: Secondary | ICD-10-CM | POA: Diagnosis not present

## 2021-03-21 DIAGNOSIS — I517 Cardiomegaly: Secondary | ICD-10-CM | POA: Diagnosis not present

## 2021-03-21 DIAGNOSIS — J90 Pleural effusion, not elsewhere classified: Secondary | ICD-10-CM | POA: Diagnosis not present

## 2021-03-21 DIAGNOSIS — Z452 Encounter for adjustment and management of vascular access device: Secondary | ICD-10-CM | POA: Diagnosis not present

## 2021-03-21 DIAGNOSIS — I25118 Atherosclerotic heart disease of native coronary artery with other forms of angina pectoris: Secondary | ICD-10-CM | POA: Diagnosis not present

## 2021-03-21 DIAGNOSIS — I214 Non-ST elevation (NSTEMI) myocardial infarction: Secondary | ICD-10-CM | POA: Diagnosis not present

## 2021-03-21 DIAGNOSIS — I2510 Atherosclerotic heart disease of native coronary artery without angina pectoris: Secondary | ICD-10-CM | POA: Diagnosis not present

## 2021-03-22 DIAGNOSIS — J811 Chronic pulmonary edema: Secondary | ICD-10-CM | POA: Diagnosis not present

## 2021-03-22 DIAGNOSIS — Z9889 Other specified postprocedural states: Secondary | ICD-10-CM | POA: Diagnosis not present

## 2021-03-22 DIAGNOSIS — J90 Pleural effusion, not elsewhere classified: Secondary | ICD-10-CM | POA: Diagnosis not present

## 2021-03-22 DIAGNOSIS — I25118 Atherosclerotic heart disease of native coronary artery with other forms of angina pectoris: Secondary | ICD-10-CM | POA: Diagnosis not present

## 2021-03-22 DIAGNOSIS — Z951 Presence of aortocoronary bypass graft: Secondary | ICD-10-CM | POA: Diagnosis not present

## 2021-03-22 DIAGNOSIS — M47814 Spondylosis without myelopathy or radiculopathy, thoracic region: Secondary | ICD-10-CM | POA: Diagnosis not present

## 2021-03-22 DIAGNOSIS — J9811 Atelectasis: Secondary | ICD-10-CM | POA: Diagnosis not present

## 2021-03-23 DIAGNOSIS — M47814 Spondylosis without myelopathy or radiculopathy, thoracic region: Secondary | ICD-10-CM | POA: Diagnosis not present

## 2021-03-23 DIAGNOSIS — Z951 Presence of aortocoronary bypass graft: Secondary | ICD-10-CM | POA: Diagnosis not present

## 2021-03-23 DIAGNOSIS — Z9889 Other specified postprocedural states: Secondary | ICD-10-CM | POA: Diagnosis not present

## 2021-03-23 DIAGNOSIS — J811 Chronic pulmonary edema: Secondary | ICD-10-CM | POA: Diagnosis not present

## 2021-03-23 DIAGNOSIS — J90 Pleural effusion, not elsewhere classified: Secondary | ICD-10-CM | POA: Diagnosis not present

## 2021-03-23 DIAGNOSIS — I25118 Atherosclerotic heart disease of native coronary artery with other forms of angina pectoris: Secondary | ICD-10-CM | POA: Diagnosis not present

## 2021-03-23 DIAGNOSIS — J9811 Atelectasis: Secondary | ICD-10-CM | POA: Diagnosis not present

## 2021-03-24 DIAGNOSIS — Z9889 Other specified postprocedural states: Secondary | ICD-10-CM | POA: Diagnosis not present

## 2021-03-24 DIAGNOSIS — I25118 Atherosclerotic heart disease of native coronary artery with other forms of angina pectoris: Secondary | ICD-10-CM | POA: Diagnosis not present

## 2021-03-24 DIAGNOSIS — J9811 Atelectasis: Secondary | ICD-10-CM | POA: Diagnosis not present

## 2021-03-24 DIAGNOSIS — J90 Pleural effusion, not elsewhere classified: Secondary | ICD-10-CM | POA: Diagnosis not present

## 2021-03-24 DIAGNOSIS — J811 Chronic pulmonary edema: Secondary | ICD-10-CM | POA: Diagnosis not present

## 2021-03-24 NOTE — Progress Notes (Deleted)
   Patient ID: Mckenzie Wright, female    DOB: September 17, 1945, 75 y.o.   MRN: [id number]  HPI  Mckenzie Wright is a 75 y/o female with a history of  Echo report from 03/17/21 reviewed and showed an EF of 40-45% along with mild MR.   LHC done 03/18/21 showed: Prox RCA lesion is 100% stenosed.   Dist RCA lesion is 100% stenosed.   Ramus lesion is 85% stenosed.   Mid LAD lesion is 90% stenosed.   LV end diastolic pressure is normal.  Admitted  Review of Systems    Physical Exam    Assessment & Plan: 1: Chronic heart failure with mildly reduced ejection fraction- - NYHA class

## 2021-03-25 DIAGNOSIS — J9811 Atelectasis: Secondary | ICD-10-CM | POA: Diagnosis not present

## 2021-03-25 DIAGNOSIS — J90 Pleural effusion, not elsewhere classified: Secondary | ICD-10-CM | POA: Diagnosis not present

## 2021-03-25 DIAGNOSIS — E1122 Type 2 diabetes mellitus with diabetic chronic kidney disease: Secondary | ICD-10-CM | POA: Diagnosis not present

## 2021-03-25 DIAGNOSIS — N179 Acute kidney failure, unspecified: Secondary | ICD-10-CM | POA: Diagnosis not present

## 2021-03-25 DIAGNOSIS — Z794 Long term (current) use of insulin: Secondary | ICD-10-CM | POA: Diagnosis not present

## 2021-03-25 DIAGNOSIS — I25118 Atherosclerotic heart disease of native coronary artery with other forms of angina pectoris: Secondary | ICD-10-CM | POA: Diagnosis not present

## 2021-03-25 DIAGNOSIS — N189 Chronic kidney disease, unspecified: Secondary | ICD-10-CM | POA: Diagnosis not present

## 2021-03-25 DIAGNOSIS — Z951 Presence of aortocoronary bypass graft: Secondary | ICD-10-CM | POA: Diagnosis not present

## 2021-03-25 DIAGNOSIS — N184 Chronic kidney disease, stage 4 (severe): Secondary | ICD-10-CM | POA: Diagnosis not present

## 2021-03-26 ENCOUNTER — Ambulatory Visit: Payer: Medicare HMO | Admitting: Family

## 2021-03-26 DIAGNOSIS — J90 Pleural effusion, not elsewhere classified: Secondary | ICD-10-CM | POA: Diagnosis not present

## 2021-03-26 DIAGNOSIS — I25118 Atherosclerotic heart disease of native coronary artery with other forms of angina pectoris: Secondary | ICD-10-CM | POA: Diagnosis not present

## 2021-03-26 DIAGNOSIS — E1122 Type 2 diabetes mellitus with diabetic chronic kidney disease: Secondary | ICD-10-CM | POA: Diagnosis not present

## 2021-03-26 DIAGNOSIS — N184 Chronic kidney disease, stage 4 (severe): Secondary | ICD-10-CM | POA: Diagnosis not present

## 2021-03-26 DIAGNOSIS — Z794 Long term (current) use of insulin: Secondary | ICD-10-CM | POA: Diagnosis not present

## 2021-03-26 DIAGNOSIS — Z951 Presence of aortocoronary bypass graft: Secondary | ICD-10-CM | POA: Diagnosis not present

## 2021-03-26 DIAGNOSIS — N189 Chronic kidney disease, unspecified: Secondary | ICD-10-CM | POA: Diagnosis not present

## 2021-03-26 DIAGNOSIS — R918 Other nonspecific abnormal finding of lung field: Secondary | ICD-10-CM | POA: Diagnosis not present

## 2021-03-26 DIAGNOSIS — N179 Acute kidney failure, unspecified: Secondary | ICD-10-CM | POA: Diagnosis not present

## 2021-03-27 DIAGNOSIS — N179 Acute kidney failure, unspecified: Secondary | ICD-10-CM | POA: Diagnosis not present

## 2021-03-27 DIAGNOSIS — N189 Chronic kidney disease, unspecified: Secondary | ICD-10-CM | POA: Diagnosis not present

## 2021-03-27 DIAGNOSIS — E1122 Type 2 diabetes mellitus with diabetic chronic kidney disease: Secondary | ICD-10-CM | POA: Diagnosis not present

## 2021-03-27 DIAGNOSIS — Z794 Long term (current) use of insulin: Secondary | ICD-10-CM | POA: Diagnosis not present

## 2021-03-27 DIAGNOSIS — N184 Chronic kidney disease, stage 4 (severe): Secondary | ICD-10-CM | POA: Diagnosis not present

## 2021-04-03 DIAGNOSIS — Z23 Encounter for immunization: Secondary | ICD-10-CM | POA: Diagnosis not present

## 2021-04-03 DIAGNOSIS — I25119 Atherosclerotic heart disease of native coronary artery with unspecified angina pectoris: Secondary | ICD-10-CM | POA: Diagnosis not present

## 2021-04-03 DIAGNOSIS — Z794 Long term (current) use of insulin: Secondary | ICD-10-CM | POA: Diagnosis not present

## 2021-04-03 DIAGNOSIS — E1122 Type 2 diabetes mellitus with diabetic chronic kidney disease: Secondary | ICD-10-CM | POA: Diagnosis not present

## 2021-04-03 DIAGNOSIS — N184 Chronic kidney disease, stage 4 (severe): Secondary | ICD-10-CM | POA: Diagnosis not present

## 2021-04-04 DIAGNOSIS — I2510 Atherosclerotic heart disease of native coronary artery without angina pectoris: Secondary | ICD-10-CM | POA: Diagnosis not present

## 2021-04-04 DIAGNOSIS — I4892 Unspecified atrial flutter: Secondary | ICD-10-CM | POA: Diagnosis not present

## 2021-04-04 DIAGNOSIS — Z48812 Encounter for surgical aftercare following surgery on the circulatory system: Secondary | ICD-10-CM | POA: Diagnosis not present

## 2021-04-04 DIAGNOSIS — I4891 Unspecified atrial fibrillation: Secondary | ICD-10-CM | POA: Diagnosis not present

## 2021-04-04 DIAGNOSIS — N184 Chronic kidney disease, stage 4 (severe): Secondary | ICD-10-CM | POA: Diagnosis not present

## 2021-04-04 DIAGNOSIS — I2109 ST elevation (STEMI) myocardial infarction involving other coronary artery of anterior wall: Secondary | ICD-10-CM | POA: Diagnosis not present

## 2021-04-04 DIAGNOSIS — Z951 Presence of aortocoronary bypass graft: Secondary | ICD-10-CM | POA: Diagnosis not present

## 2021-04-04 DIAGNOSIS — E1122 Type 2 diabetes mellitus with diabetic chronic kidney disease: Secondary | ICD-10-CM | POA: Diagnosis not present

## 2021-04-04 DIAGNOSIS — I129 Hypertensive chronic kidney disease with stage 1 through stage 4 chronic kidney disease, or unspecified chronic kidney disease: Secondary | ICD-10-CM | POA: Diagnosis not present

## 2021-04-05 DIAGNOSIS — Z951 Presence of aortocoronary bypass graft: Secondary | ICD-10-CM | POA: Diagnosis not present

## 2021-04-05 DIAGNOSIS — I2510 Atherosclerotic heart disease of native coronary artery without angina pectoris: Secondary | ICD-10-CM | POA: Diagnosis not present

## 2021-04-05 DIAGNOSIS — N184 Chronic kidney disease, stage 4 (severe): Secondary | ICD-10-CM | POA: Diagnosis not present

## 2021-04-05 DIAGNOSIS — E1122 Type 2 diabetes mellitus with diabetic chronic kidney disease: Secondary | ICD-10-CM | POA: Diagnosis not present

## 2021-04-05 DIAGNOSIS — Z48812 Encounter for surgical aftercare following surgery on the circulatory system: Secondary | ICD-10-CM | POA: Diagnosis not present

## 2021-04-05 DIAGNOSIS — I129 Hypertensive chronic kidney disease with stage 1 through stage 4 chronic kidney disease, or unspecified chronic kidney disease: Secondary | ICD-10-CM | POA: Diagnosis not present

## 2021-04-05 DIAGNOSIS — I4891 Unspecified atrial fibrillation: Secondary | ICD-10-CM | POA: Diagnosis not present

## 2021-04-05 DIAGNOSIS — I4892 Unspecified atrial flutter: Secondary | ICD-10-CM | POA: Diagnosis not present

## 2021-04-05 DIAGNOSIS — I2109 ST elevation (STEMI) myocardial infarction involving other coronary artery of anterior wall: Secondary | ICD-10-CM | POA: Diagnosis not present

## 2021-04-08 DIAGNOSIS — Z8701 Personal history of pneumonia (recurrent): Secondary | ICD-10-CM | POA: Diagnosis not present

## 2021-04-08 DIAGNOSIS — Z951 Presence of aortocoronary bypass graft: Secondary | ICD-10-CM | POA: Diagnosis not present

## 2021-04-08 DIAGNOSIS — J9811 Atelectasis: Secondary | ICD-10-CM | POA: Diagnosis not present

## 2021-04-08 DIAGNOSIS — Z48812 Encounter for surgical aftercare following surgery on the circulatory system: Secondary | ICD-10-CM | POA: Diagnosis not present

## 2021-04-08 DIAGNOSIS — I252 Old myocardial infarction: Secondary | ICD-10-CM | POA: Diagnosis not present

## 2021-04-08 DIAGNOSIS — I4891 Unspecified atrial fibrillation: Secondary | ICD-10-CM | POA: Diagnosis not present

## 2021-04-08 DIAGNOSIS — Z9889 Other specified postprocedural states: Secondary | ICD-10-CM | POA: Diagnosis not present

## 2021-04-08 DIAGNOSIS — J90 Pleural effusion, not elsewhere classified: Secondary | ICD-10-CM | POA: Diagnosis not present

## 2021-04-08 DIAGNOSIS — R9431 Abnormal electrocardiogram [ECG] [EKG]: Secondary | ICD-10-CM | POA: Diagnosis not present

## 2021-04-10 DIAGNOSIS — Z48812 Encounter for surgical aftercare following surgery on the circulatory system: Secondary | ICD-10-CM | POA: Diagnosis not present

## 2021-04-10 DIAGNOSIS — I4891 Unspecified atrial fibrillation: Secondary | ICD-10-CM | POA: Diagnosis not present

## 2021-04-10 DIAGNOSIS — I129 Hypertensive chronic kidney disease with stage 1 through stage 4 chronic kidney disease, or unspecified chronic kidney disease: Secondary | ICD-10-CM | POA: Diagnosis not present

## 2021-04-10 DIAGNOSIS — E1122 Type 2 diabetes mellitus with diabetic chronic kidney disease: Secondary | ICD-10-CM | POA: Diagnosis not present

## 2021-04-10 DIAGNOSIS — I2109 ST elevation (STEMI) myocardial infarction involving other coronary artery of anterior wall: Secondary | ICD-10-CM | POA: Diagnosis not present

## 2021-04-10 DIAGNOSIS — I2510 Atherosclerotic heart disease of native coronary artery without angina pectoris: Secondary | ICD-10-CM | POA: Diagnosis not present

## 2021-04-10 DIAGNOSIS — N184 Chronic kidney disease, stage 4 (severe): Secondary | ICD-10-CM | POA: Diagnosis not present

## 2021-04-10 DIAGNOSIS — Z951 Presence of aortocoronary bypass graft: Secondary | ICD-10-CM | POA: Diagnosis not present

## 2021-04-10 DIAGNOSIS — I4892 Unspecified atrial flutter: Secondary | ICD-10-CM | POA: Diagnosis not present

## 2021-04-11 DIAGNOSIS — E1122 Type 2 diabetes mellitus with diabetic chronic kidney disease: Secondary | ICD-10-CM | POA: Diagnosis not present

## 2021-04-11 DIAGNOSIS — I129 Hypertensive chronic kidney disease with stage 1 through stage 4 chronic kidney disease, or unspecified chronic kidney disease: Secondary | ICD-10-CM | POA: Diagnosis not present

## 2021-04-11 DIAGNOSIS — E039 Hypothyroidism, unspecified: Secondary | ICD-10-CM | POA: Diagnosis not present

## 2021-04-11 DIAGNOSIS — I4891 Unspecified atrial fibrillation: Secondary | ICD-10-CM | POA: Diagnosis not present

## 2021-04-11 DIAGNOSIS — I2109 ST elevation (STEMI) myocardial infarction involving other coronary artery of anterior wall: Secondary | ICD-10-CM | POA: Diagnosis not present

## 2021-04-11 DIAGNOSIS — I4892 Unspecified atrial flutter: Secondary | ICD-10-CM | POA: Diagnosis not present

## 2021-04-11 DIAGNOSIS — Z951 Presence of aortocoronary bypass graft: Secondary | ICD-10-CM | POA: Diagnosis not present

## 2021-04-11 DIAGNOSIS — I2510 Atherosclerotic heart disease of native coronary artery without angina pectoris: Secondary | ICD-10-CM | POA: Diagnosis not present

## 2021-04-11 DIAGNOSIS — Z48812 Encounter for surgical aftercare following surgery on the circulatory system: Secondary | ICD-10-CM | POA: Diagnosis not present

## 2021-04-11 DIAGNOSIS — Z794 Long term (current) use of insulin: Secondary | ICD-10-CM | POA: Diagnosis not present

## 2021-04-11 DIAGNOSIS — I25119 Atherosclerotic heart disease of native coronary artery with unspecified angina pectoris: Secondary | ICD-10-CM | POA: Diagnosis not present

## 2021-04-11 DIAGNOSIS — N184 Chronic kidney disease, stage 4 (severe): Secondary | ICD-10-CM | POA: Diagnosis not present

## 2021-04-12 DIAGNOSIS — I2510 Atherosclerotic heart disease of native coronary artery without angina pectoris: Secondary | ICD-10-CM | POA: Diagnosis not present

## 2021-04-12 DIAGNOSIS — E1122 Type 2 diabetes mellitus with diabetic chronic kidney disease: Secondary | ICD-10-CM | POA: Diagnosis not present

## 2021-04-12 DIAGNOSIS — I129 Hypertensive chronic kidney disease with stage 1 through stage 4 chronic kidney disease, or unspecified chronic kidney disease: Secondary | ICD-10-CM | POA: Diagnosis not present

## 2021-04-12 DIAGNOSIS — Z48812 Encounter for surgical aftercare following surgery on the circulatory system: Secondary | ICD-10-CM | POA: Diagnosis not present

## 2021-04-12 DIAGNOSIS — I2109 ST elevation (STEMI) myocardial infarction involving other coronary artery of anterior wall: Secondary | ICD-10-CM | POA: Diagnosis not present

## 2021-04-12 DIAGNOSIS — I4891 Unspecified atrial fibrillation: Secondary | ICD-10-CM | POA: Diagnosis not present

## 2021-04-12 DIAGNOSIS — Z951 Presence of aortocoronary bypass graft: Secondary | ICD-10-CM | POA: Diagnosis not present

## 2021-04-12 DIAGNOSIS — I4892 Unspecified atrial flutter: Secondary | ICD-10-CM | POA: Diagnosis not present

## 2021-04-12 DIAGNOSIS — N184 Chronic kidney disease, stage 4 (severe): Secondary | ICD-10-CM | POA: Diagnosis not present

## 2021-04-15 DIAGNOSIS — I700 Atherosclerosis of aorta: Secondary | ICD-10-CM | POA: Diagnosis not present

## 2021-04-15 DIAGNOSIS — N184 Chronic kidney disease, stage 4 (severe): Secondary | ICD-10-CM | POA: Diagnosis not present

## 2021-04-15 DIAGNOSIS — E7800 Pure hypercholesterolemia, unspecified: Secondary | ICD-10-CM | POA: Diagnosis not present

## 2021-04-15 DIAGNOSIS — Z794 Long term (current) use of insulin: Secondary | ICD-10-CM | POA: Diagnosis not present

## 2021-04-15 DIAGNOSIS — Z951 Presence of aortocoronary bypass graft: Secondary | ICD-10-CM | POA: Diagnosis not present

## 2021-04-15 DIAGNOSIS — I25119 Atherosclerotic heart disease of native coronary artery with unspecified angina pectoris: Secondary | ICD-10-CM | POA: Diagnosis not present

## 2021-04-15 DIAGNOSIS — E1122 Type 2 diabetes mellitus with diabetic chronic kidney disease: Secondary | ICD-10-CM | POA: Diagnosis not present

## 2021-04-15 DIAGNOSIS — I4891 Unspecified atrial fibrillation: Secondary | ICD-10-CM | POA: Diagnosis not present

## 2021-04-15 DIAGNOSIS — I10 Essential (primary) hypertension: Secondary | ICD-10-CM | POA: Diagnosis not present

## 2021-04-17 DIAGNOSIS — Z951 Presence of aortocoronary bypass graft: Secondary | ICD-10-CM | POA: Diagnosis not present

## 2021-04-17 DIAGNOSIS — Z48812 Encounter for surgical aftercare following surgery on the circulatory system: Secondary | ICD-10-CM | POA: Diagnosis not present

## 2021-04-17 DIAGNOSIS — N184 Chronic kidney disease, stage 4 (severe): Secondary | ICD-10-CM | POA: Diagnosis not present

## 2021-04-17 DIAGNOSIS — I2510 Atherosclerotic heart disease of native coronary artery without angina pectoris: Secondary | ICD-10-CM | POA: Diagnosis not present

## 2021-04-17 DIAGNOSIS — I4891 Unspecified atrial fibrillation: Secondary | ICD-10-CM | POA: Diagnosis not present

## 2021-04-17 DIAGNOSIS — E1122 Type 2 diabetes mellitus with diabetic chronic kidney disease: Secondary | ICD-10-CM | POA: Diagnosis not present

## 2021-04-17 DIAGNOSIS — I2109 ST elevation (STEMI) myocardial infarction involving other coronary artery of anterior wall: Secondary | ICD-10-CM | POA: Diagnosis not present

## 2021-04-17 DIAGNOSIS — I129 Hypertensive chronic kidney disease with stage 1 through stage 4 chronic kidney disease, or unspecified chronic kidney disease: Secondary | ICD-10-CM | POA: Diagnosis not present

## 2021-04-17 DIAGNOSIS — I4892 Unspecified atrial flutter: Secondary | ICD-10-CM | POA: Diagnosis not present

## 2021-04-19 DIAGNOSIS — I2109 ST elevation (STEMI) myocardial infarction involving other coronary artery of anterior wall: Secondary | ICD-10-CM | POA: Diagnosis not present

## 2021-04-19 DIAGNOSIS — I2510 Atherosclerotic heart disease of native coronary artery without angina pectoris: Secondary | ICD-10-CM | POA: Diagnosis not present

## 2021-04-19 DIAGNOSIS — E1122 Type 2 diabetes mellitus with diabetic chronic kidney disease: Secondary | ICD-10-CM | POA: Diagnosis not present

## 2021-04-19 DIAGNOSIS — I4892 Unspecified atrial flutter: Secondary | ICD-10-CM | POA: Diagnosis not present

## 2021-04-19 DIAGNOSIS — Z48812 Encounter for surgical aftercare following surgery on the circulatory system: Secondary | ICD-10-CM | POA: Diagnosis not present

## 2021-04-19 DIAGNOSIS — N184 Chronic kidney disease, stage 4 (severe): Secondary | ICD-10-CM | POA: Diagnosis not present

## 2021-04-19 DIAGNOSIS — Z951 Presence of aortocoronary bypass graft: Secondary | ICD-10-CM | POA: Diagnosis not present

## 2021-04-19 DIAGNOSIS — I4891 Unspecified atrial fibrillation: Secondary | ICD-10-CM | POA: Diagnosis not present

## 2021-04-19 DIAGNOSIS — I129 Hypertensive chronic kidney disease with stage 1 through stage 4 chronic kidney disease, or unspecified chronic kidney disease: Secondary | ICD-10-CM | POA: Diagnosis not present

## 2021-04-24 DIAGNOSIS — I4892 Unspecified atrial flutter: Secondary | ICD-10-CM | POA: Diagnosis not present

## 2021-04-24 DIAGNOSIS — Z951 Presence of aortocoronary bypass graft: Secondary | ICD-10-CM | POA: Diagnosis not present

## 2021-04-24 DIAGNOSIS — I4891 Unspecified atrial fibrillation: Secondary | ICD-10-CM | POA: Diagnosis not present

## 2021-04-24 DIAGNOSIS — I129 Hypertensive chronic kidney disease with stage 1 through stage 4 chronic kidney disease, or unspecified chronic kidney disease: Secondary | ICD-10-CM | POA: Diagnosis not present

## 2021-04-24 DIAGNOSIS — I2510 Atherosclerotic heart disease of native coronary artery without angina pectoris: Secondary | ICD-10-CM | POA: Diagnosis not present

## 2021-04-24 DIAGNOSIS — I2109 ST elevation (STEMI) myocardial infarction involving other coronary artery of anterior wall: Secondary | ICD-10-CM | POA: Diagnosis not present

## 2021-04-24 DIAGNOSIS — Z48812 Encounter for surgical aftercare following surgery on the circulatory system: Secondary | ICD-10-CM | POA: Diagnosis not present

## 2021-04-24 DIAGNOSIS — E1122 Type 2 diabetes mellitus with diabetic chronic kidney disease: Secondary | ICD-10-CM | POA: Diagnosis not present

## 2021-04-24 DIAGNOSIS — N184 Chronic kidney disease, stage 4 (severe): Secondary | ICD-10-CM | POA: Diagnosis not present

## 2021-04-25 ENCOUNTER — Other Ambulatory Visit (HOSPITAL_BASED_OUTPATIENT_CLINIC_OR_DEPARTMENT_OTHER): Payer: Self-pay | Admitting: Nephrology

## 2021-04-25 ENCOUNTER — Other Ambulatory Visit: Payer: Self-pay | Admitting: Nephrology

## 2021-04-25 DIAGNOSIS — E785 Hyperlipidemia, unspecified: Secondary | ICD-10-CM

## 2021-04-25 DIAGNOSIS — I4892 Unspecified atrial flutter: Secondary | ICD-10-CM | POA: Diagnosis not present

## 2021-04-25 DIAGNOSIS — I10 Essential (primary) hypertension: Secondary | ICD-10-CM | POA: Diagnosis not present

## 2021-04-25 DIAGNOSIS — E1122 Type 2 diabetes mellitus with diabetic chronic kidney disease: Secondary | ICD-10-CM

## 2021-04-25 DIAGNOSIS — E875 Hyperkalemia: Secondary | ICD-10-CM

## 2021-04-25 DIAGNOSIS — Z951 Presence of aortocoronary bypass graft: Secondary | ICD-10-CM

## 2021-04-25 DIAGNOSIS — I2510 Atherosclerotic heart disease of native coronary artery without angina pectoris: Secondary | ICD-10-CM | POA: Diagnosis not present

## 2021-04-25 DIAGNOSIS — I129 Hypertensive chronic kidney disease with stage 1 through stage 4 chronic kidney disease, or unspecified chronic kidney disease: Secondary | ICD-10-CM | POA: Diagnosis not present

## 2021-04-25 DIAGNOSIS — I4821 Permanent atrial fibrillation: Secondary | ICD-10-CM | POA: Diagnosis not present

## 2021-04-25 DIAGNOSIS — I509 Heart failure, unspecified: Secondary | ICD-10-CM

## 2021-04-25 DIAGNOSIS — Z48812 Encounter for surgical aftercare following surgery on the circulatory system: Secondary | ICD-10-CM | POA: Diagnosis not present

## 2021-04-25 DIAGNOSIS — I259 Chronic ischemic heart disease, unspecified: Secondary | ICD-10-CM

## 2021-04-25 DIAGNOSIS — N184 Chronic kidney disease, stage 4 (severe): Secondary | ICD-10-CM

## 2021-04-25 DIAGNOSIS — N2581 Secondary hyperparathyroidism of renal origin: Secondary | ICD-10-CM | POA: Diagnosis not present

## 2021-04-25 DIAGNOSIS — I2109 ST elevation (STEMI) myocardial infarction involving other coronary artery of anterior wall: Secondary | ICD-10-CM | POA: Diagnosis not present

## 2021-04-25 DIAGNOSIS — D631 Anemia in chronic kidney disease: Secondary | ICD-10-CM | POA: Diagnosis not present

## 2021-04-25 DIAGNOSIS — R609 Edema, unspecified: Secondary | ICD-10-CM

## 2021-04-25 DIAGNOSIS — I4891 Unspecified atrial fibrillation: Secondary | ICD-10-CM | POA: Diagnosis not present

## 2021-05-03 ENCOUNTER — Ambulatory Visit
Admission: RE | Admit: 2021-05-03 | Discharge: 2021-05-03 | Disposition: A | Discharge status: Home / Self Care | Payer: Medicare HMO | Source: Ambulatory Visit | Attending: Nephrology | Admitting: Nephrology

## 2021-05-03 ENCOUNTER — Other Ambulatory Visit: Payer: Self-pay

## 2021-05-03 DIAGNOSIS — E785 Hyperlipidemia, unspecified: Secondary | ICD-10-CM | POA: Diagnosis not present

## 2021-05-03 DIAGNOSIS — R609 Edema, unspecified: Secondary | ICD-10-CM | POA: Insufficient documentation

## 2021-05-03 DIAGNOSIS — E1122 Type 2 diabetes mellitus with diabetic chronic kidney disease: Secondary | ICD-10-CM | POA: Insufficient documentation

## 2021-05-03 DIAGNOSIS — N2581 Secondary hyperparathyroidism of renal origin: Secondary | ICD-10-CM | POA: Insufficient documentation

## 2021-05-03 DIAGNOSIS — N184 Chronic kidney disease, stage 4 (severe): Secondary | ICD-10-CM | POA: Diagnosis not present

## 2021-05-03 DIAGNOSIS — Z951 Presence of aortocoronary bypass graft: Secondary | ICD-10-CM | POA: Insufficient documentation

## 2021-05-03 DIAGNOSIS — I10 Essential (primary) hypertension: Secondary | ICD-10-CM | POA: Diagnosis not present

## 2021-05-03 DIAGNOSIS — N281 Cyst of kidney, acquired: Secondary | ICD-10-CM | POA: Diagnosis not present

## 2021-05-03 DIAGNOSIS — I259 Chronic ischemic heart disease, unspecified: Secondary | ICD-10-CM | POA: Diagnosis present

## 2021-05-03 DIAGNOSIS — I509 Heart failure, unspecified: Secondary | ICD-10-CM | POA: Diagnosis present

## 2021-05-03 DIAGNOSIS — E875 Hyperkalemia: Secondary | ICD-10-CM | POA: Insufficient documentation

## 2021-05-03 DIAGNOSIS — I4821 Permanent atrial fibrillation: Secondary | ICD-10-CM | POA: Diagnosis not present

## 2021-05-03 IMAGING — US US RENAL
1 series · 14 of 25 positions shown · non-contrast
Comparison: [DATE]

CLINICAL DATA: Post CABG.  Hyperlipidemia.  Hyperkalemia.

EXAM:
RENAL / URINARY TRACT ULTRASOUND COMPLETE

[Series 1: us renal · 0.22mm/px · 14 of 46 slices shown]
[im 1/46]
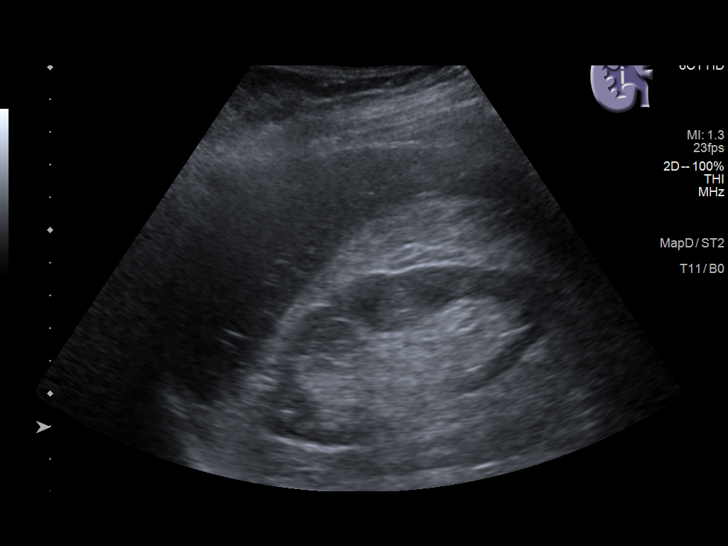
[im 4/46]
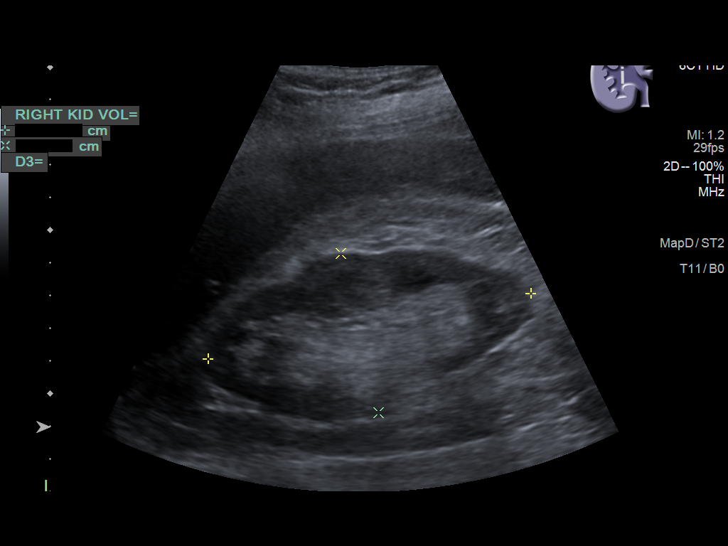
[im 8/46]
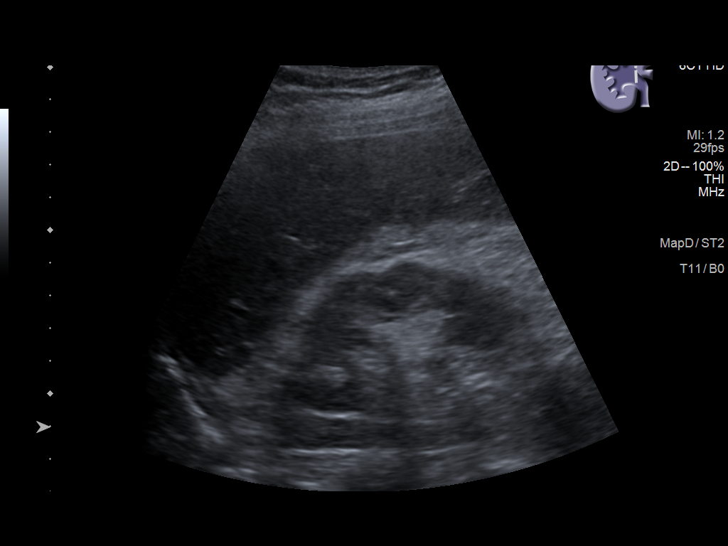
[im 12/46]
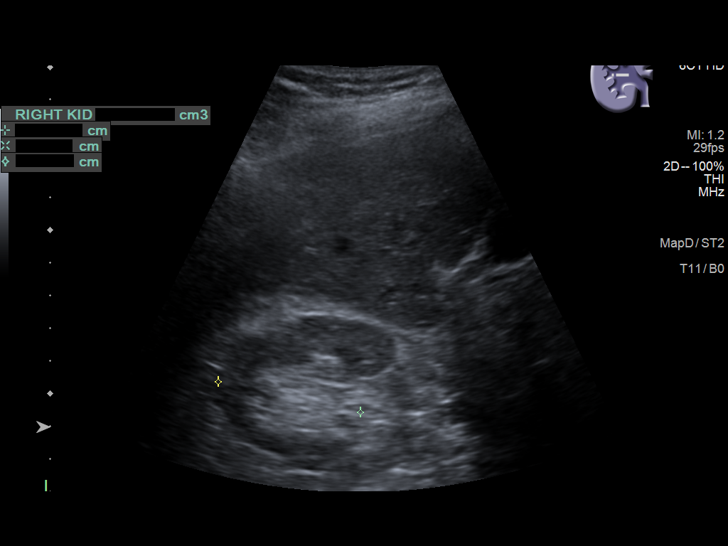
[im 16/46]
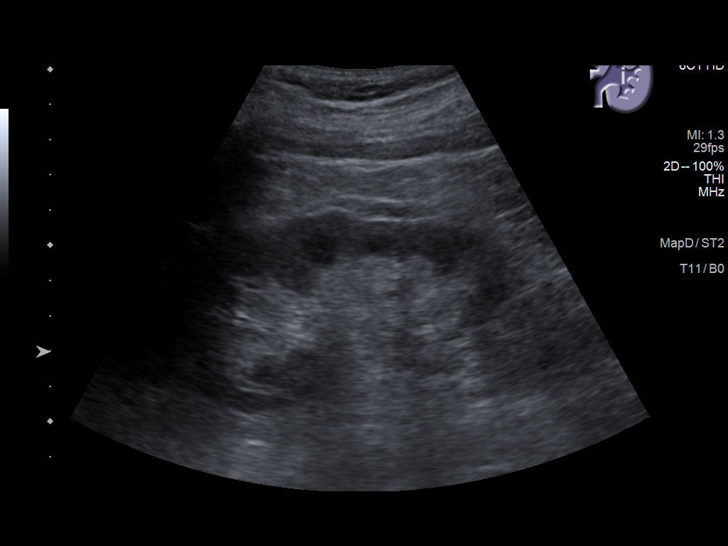
[im 17/46]
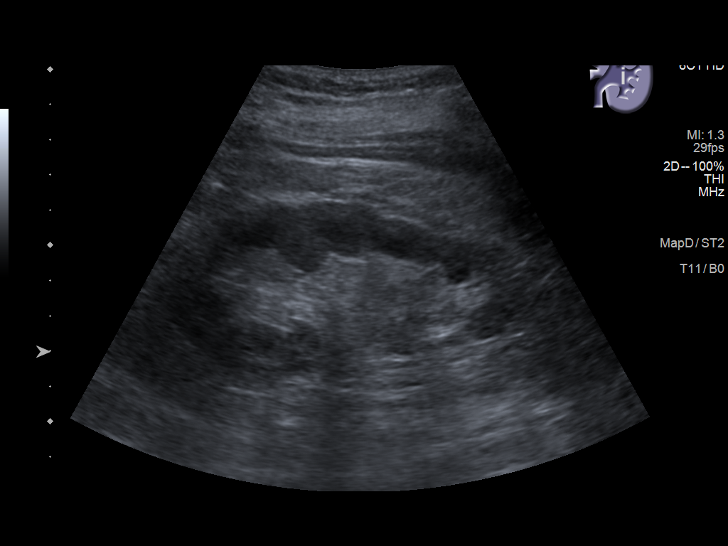
[im 21/46]
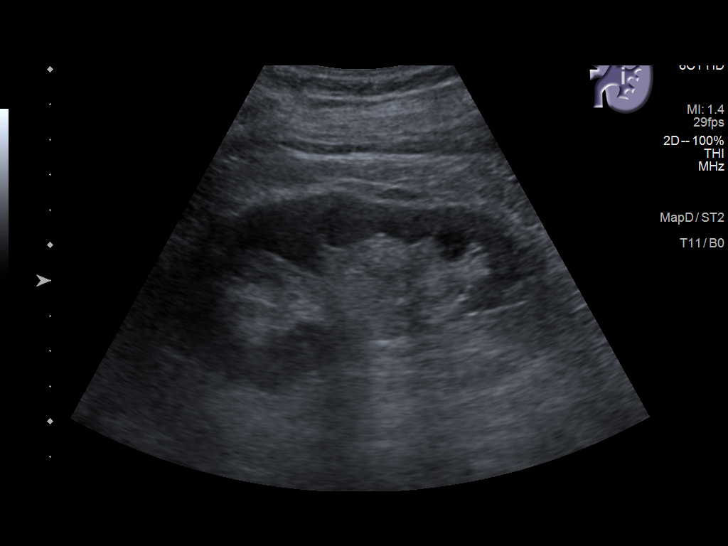
[im 25/46]
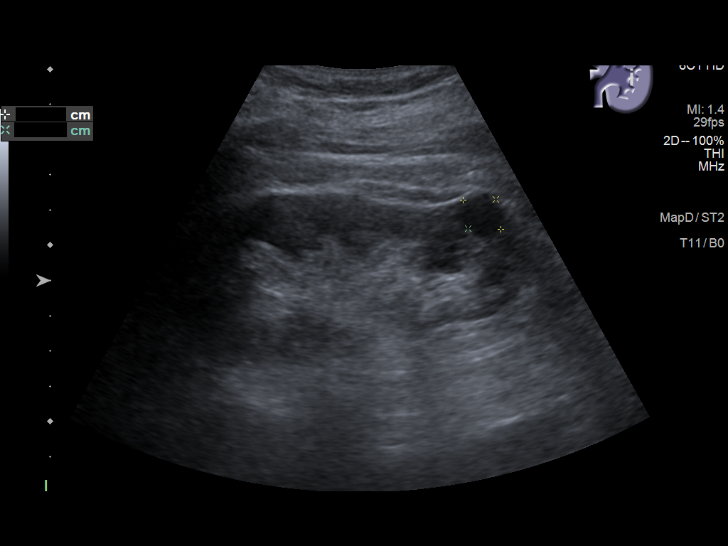
[im 29/46]
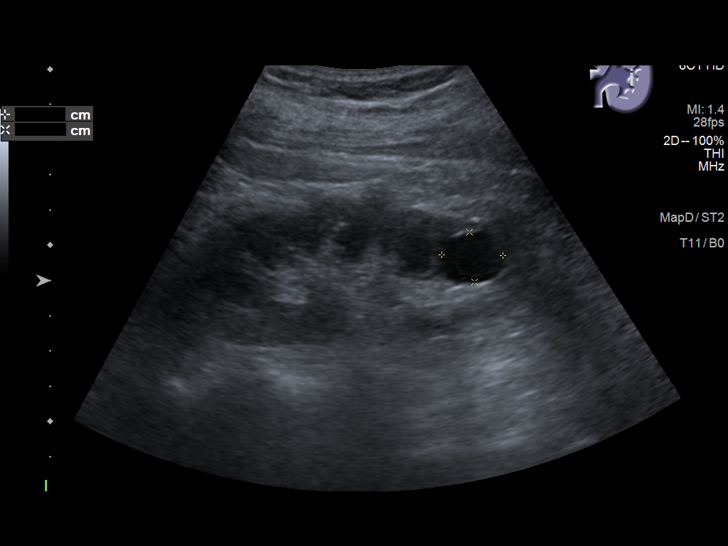
[im 31/46]
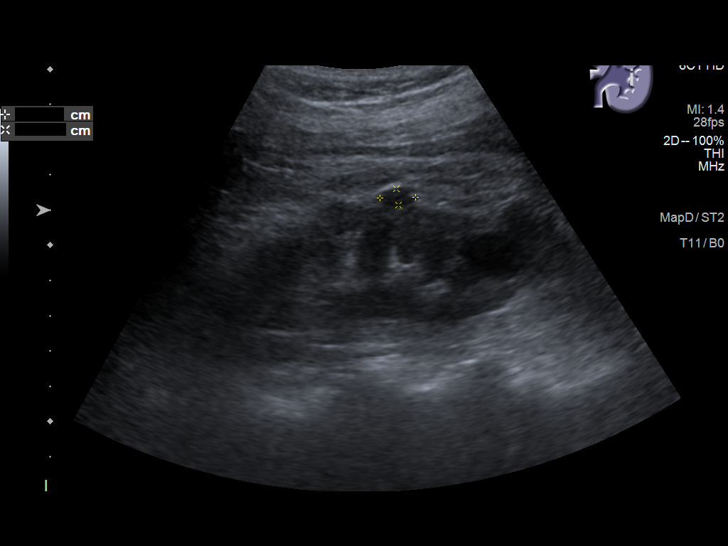
[im 34/46]
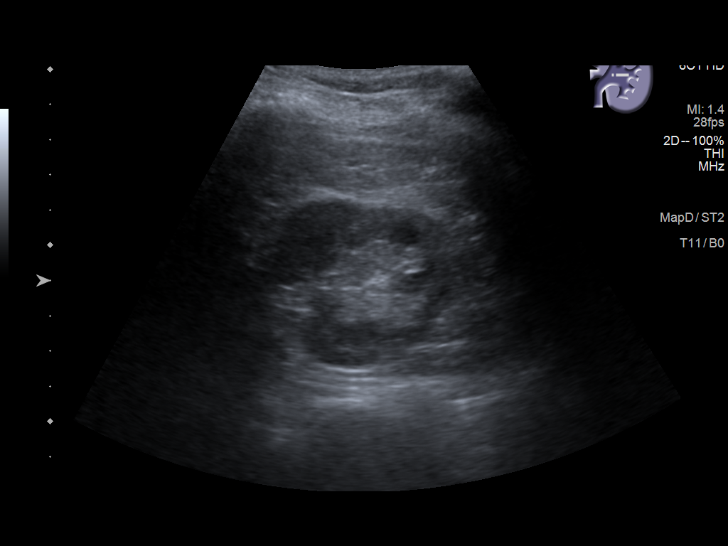
[im 38/46]
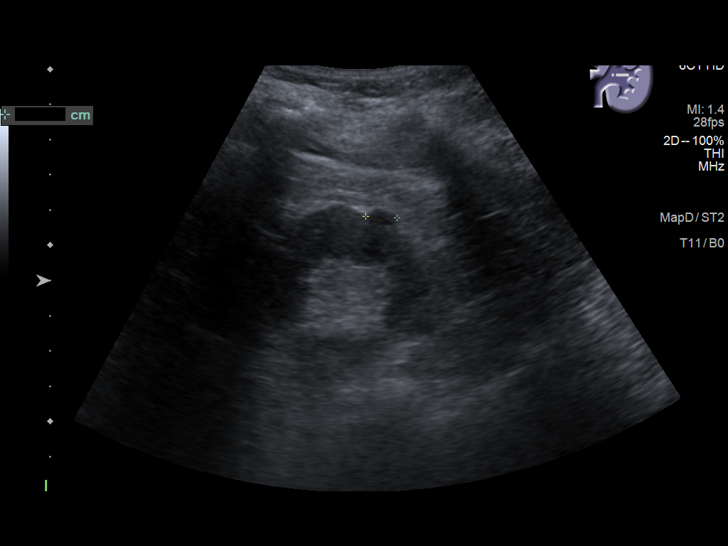
[im 42/46]
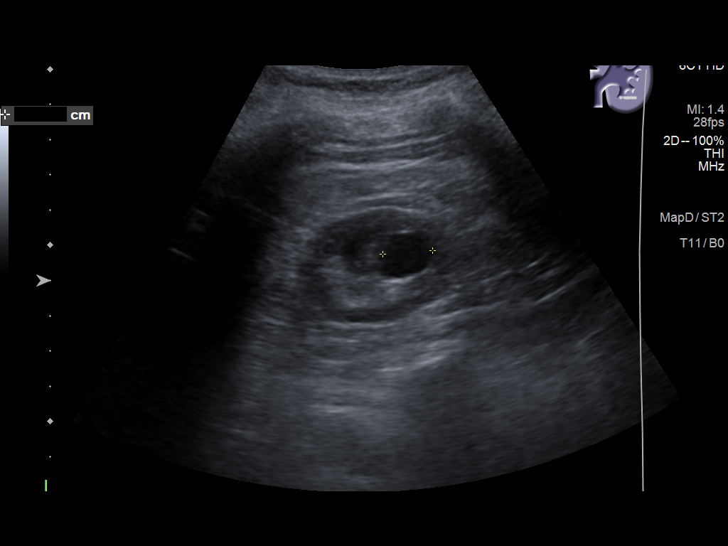
[im 46/46]
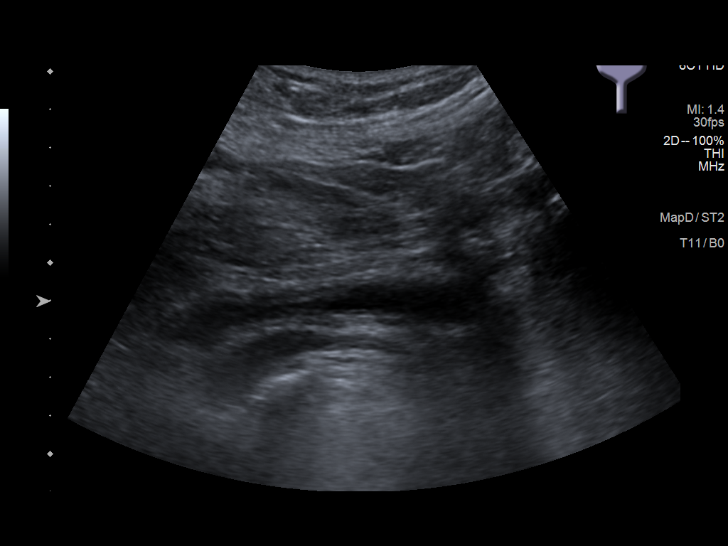

[14 of 25 positions shown; findings below may reference images not displayed]

FINDINGS: Right Kidney:

Renal measurements: 10.1 x 5.0 x 4.5 cm = volume: 118 mL.
Echogenicity within normal limits. No mass or hydronephrosis
visualized.

Left Kidney:

Renal measurements: 10.6 x 4.4 x 4.9 cm = volume: 120 ML.
Echogenicity within normal limits. No mass or hydronephrosis
visualized. There multiple cysts present. Largest cyst is over the
inferior pole measuring 1.7 cm.

Bladder:

Appears normal for degree of bladder distention.

Other:

None.
IMPRESSION: Normal size kidneys without hydronephrosis. Several left renal cysts
with the largest measuring 1.7 cm over the lower pole.

## 2021-05-23 DIAGNOSIS — N2581 Secondary hyperparathyroidism of renal origin: Secondary | ICD-10-CM | POA: Diagnosis not present

## 2021-05-23 DIAGNOSIS — I4821 Permanent atrial fibrillation: Secondary | ICD-10-CM | POA: Diagnosis not present

## 2021-05-23 DIAGNOSIS — I10 Essential (primary) hypertension: Secondary | ICD-10-CM | POA: Diagnosis not present

## 2021-05-23 DIAGNOSIS — Z951 Presence of aortocoronary bypass graft: Secondary | ICD-10-CM | POA: Diagnosis not present

## 2021-05-23 DIAGNOSIS — E785 Hyperlipidemia, unspecified: Secondary | ICD-10-CM | POA: Diagnosis not present

## 2021-05-23 DIAGNOSIS — I509 Heart failure, unspecified: Secondary | ICD-10-CM | POA: Diagnosis not present

## 2021-05-23 DIAGNOSIS — N184 Chronic kidney disease, stage 4 (severe): Secondary | ICD-10-CM | POA: Diagnosis not present

## 2021-05-23 DIAGNOSIS — R609 Edema, unspecified: Secondary | ICD-10-CM | POA: Diagnosis not present

## 2021-05-23 DIAGNOSIS — E875 Hyperkalemia: Secondary | ICD-10-CM | POA: Diagnosis not present

## 2021-05-24 ENCOUNTER — Encounter: Payer: Self-pay | Admitting: Emergency Medicine

## 2021-05-24 ENCOUNTER — Other Ambulatory Visit: Payer: Self-pay

## 2021-05-24 ENCOUNTER — Emergency Department
Admission: EM | Admit: 2021-05-24 | Discharge: 2021-05-24 | Disposition: A | Discharge status: Home / Self Care | Payer: Medicare HMO | Attending: Emergency Medicine | Admitting: Emergency Medicine

## 2021-05-24 DIAGNOSIS — N184 Chronic kidney disease, stage 4 (severe): Secondary | ICD-10-CM | POA: Diagnosis not present

## 2021-05-24 DIAGNOSIS — R799 Abnormal finding of blood chemistry, unspecified: Secondary | ICD-10-CM | POA: Diagnosis present

## 2021-05-24 DIAGNOSIS — E871 Hypo-osmolality and hyponatremia: Secondary | ICD-10-CM | POA: Diagnosis not present

## 2021-05-24 DIAGNOSIS — Z7982 Long term (current) use of aspirin: Secondary | ICD-10-CM | POA: Insufficient documentation

## 2021-05-24 DIAGNOSIS — E039 Hypothyroidism, unspecified: Secondary | ICD-10-CM | POA: Diagnosis not present

## 2021-05-24 DIAGNOSIS — Z794 Long term (current) use of insulin: Secondary | ICD-10-CM | POA: Diagnosis not present

## 2021-05-24 DIAGNOSIS — Z96641 Presence of right artificial hip joint: Secondary | ICD-10-CM | POA: Insufficient documentation

## 2021-05-24 DIAGNOSIS — E875 Hyperkalemia: Secondary | ICD-10-CM | POA: Insufficient documentation

## 2021-05-24 DIAGNOSIS — Z79899 Other long term (current) drug therapy: Secondary | ICD-10-CM | POA: Insufficient documentation

## 2021-05-24 DIAGNOSIS — I2510 Atherosclerotic heart disease of native coronary artery without angina pectoris: Secondary | ICD-10-CM | POA: Diagnosis not present

## 2021-05-24 DIAGNOSIS — N1832 Chronic kidney disease, stage 3b: Secondary | ICD-10-CM | POA: Diagnosis not present

## 2021-05-24 DIAGNOSIS — E1122 Type 2 diabetes mellitus with diabetic chronic kidney disease: Secondary | ICD-10-CM | POA: Insufficient documentation

## 2021-05-24 DIAGNOSIS — R809 Proteinuria, unspecified: Secondary | ICD-10-CM | POA: Diagnosis not present

## 2021-05-24 DIAGNOSIS — I129 Hypertensive chronic kidney disease with stage 1 through stage 4 chronic kidney disease, or unspecified chronic kidney disease: Secondary | ICD-10-CM | POA: Diagnosis not present

## 2021-05-24 DIAGNOSIS — J45909 Unspecified asthma, uncomplicated: Secondary | ICD-10-CM | POA: Insufficient documentation

## 2021-05-24 LAB — BASIC METABOLIC PANEL
Anion gap: 5 (ref 5–15)
BUN: 18 mg/dL (ref 8–23)
CO2: 22 mmol/L (ref 22–32)
Calcium: 8.8 mg/dL — ABNORMAL LOW (ref 8.9–10.3)
Chloride: 107 mmol/L (ref 98–111)
Creatinine, Ser: 1.32 mg/dL — ABNORMAL HIGH (ref 0.44–1.00)
GFR, Estimated: 42 mL/min — ABNORMAL LOW (ref >60)
Glucose, Bld: 107 mg/dL — ABNORMAL HIGH (ref 70–99)
Potassium: 6.2 mmol/L — ABNORMAL HIGH (ref 3.5–5.1)
Sodium: 134 mmol/L — ABNORMAL LOW (ref 135–145)

## 2021-05-24 LAB — CBC
HCT: 39.7 % (ref 36.0–46.0)
Hemoglobin: 12.1 g/dL (ref 12.0–15.0)
MCH: 29.4 pg (ref 26.0–34.0)
MCHC: 30.5 g/dL (ref 30.0–36.0)
MCV: 96.6 fL (ref 80.0–100.0)
Platelets: 175 10*3/uL (ref 150–400)
RBC: 4.11 MIL/uL (ref 3.87–5.11)
RDW: 15.6 % — ABNORMAL HIGH (ref 11.5–15.5)
WBC: 5.3 10*3/uL (ref 4.0–10.5)
nRBC: 0 % (ref 0.0–0.2)

## 2021-05-24 LAB — TROPONIN I (HIGH SENSITIVITY)
Troponin I (High Sensitivity): 13 ng/L (ref <18)
Troponin I (High Sensitivity): 11 ng/L (ref <18)

## 2021-05-24 MED ORDER — PATIROMER SORBITEX CALCIUM 8.4 G PO PACK
16.8000 g | PACK | Freq: Every day | ORAL | Status: DC
  Administered 2021-05-24: 16.8 g via ORAL
  Filled 2021-05-24: qty 2

## 2021-05-24 NOTE — Progress Notes (Signed)
[facility]  ROUNDING NOTE   Subjective:   [name] was seen by my partner, Dr. Lanora Manis, yesterday where her serum potassium was found to be 6.8. She was asked to present to the emergency department.   Follow up potassium today of 6.2.   Patient presents with husband. Patient admits to eating green beans. She had been avoiding other high potassium foods such as bananas, oranges and tomatoes.   Patient has torsemide 20mg  pills at home but currently not taking. These were to be PRN for edema.   Objective:  Vital signs in last 24 hours:  Temp:  [97.7 F (36.5 C)] 97.7 F (36.5 C) (12/09 1123) Pulse Rate:  [64-70] 64 (12/09 1411) Resp:  [16-20] 20 (12/09 1411) BP: (119)/(68-84) 119/84 (12/09 1406) SpO2:  [97 %-100 %] 100 % (12/09 1411) Weight:  [73.3 kg] 73.3 kg (12/09 1054)  Weight change:  Filed Weights   05/24/21 1054  Weight: 73.3 kg    Intake/Output: No intake/output data recorded.   Intake/Output this shift:  No intake/output data recorded.  Physical Exam: General: NAD, laying on stretcher  Head: Normocephalic, atraumatic. Moist oral mucosal membranes  Eyes: Anicteric, PERRL  Neck: Supple, trachea midline  Lungs:  Clear to auscultation  Heart: Regular rate and rhythm  Abdomen:  Soft, nontender,   Extremities:  no peripheral edema.  Neurologic: Nonfocal, moving all four extremities  Skin: No lesions       Basic Metabolic Panel: Recent Labs  Lab 05/24/21 1124  NA 134*  K 6.2*  CL 107  CO2 22  GLUCOSE 107*  BUN 18  CREATININE 1.32*  CALCIUM 8.8*    Liver Function Tests: No results for input(s): AST, ALT, ALKPHOS, BILITOT, PROT, ALBUMIN in the last 168 hours. No results for input(s): LIPASE, AMYLASE in the last 168 hours. No results for input(s): AMMONIA in the last 168 hours.  CBC: Recent Labs  Lab 05/24/21 1124  WBC 5.3  HGB 12.1  HCT 39.7  MCV 96.6  PLT 175    Cardiac Enzymes: No results for input(s): CKTOTAL,  CKMB, CKMBINDEX, TROPONINI in the last 168 hours.  BNP: Invalid input(s): POCBNP  CBG: No results for input(s): GLUCAP in the last 168 hours.  Microbiology: Results for orders placed or performed during the hospital encounter of 03/16/21  Resp Panel by RT-PCR (Flu A&B, Covid) Nasopharyngeal Swab     Status: None   Collection Time: 03/17/21  1:30 AM   Specimen: Nasopharyngeal Swab; Nasopharyngeal(NP) swabs in vial transport medium  Result Value Ref Range Status   SARS Coronavirus 2 by RT PCR NEGATIVE NEGATIVE Final    Comment: (NOTE) SARS-CoV-2 target nucleic acids are NOT DETECTED.  The SARS-CoV-2 RNA is generally detectable in upper respiratory specimens during the acute phase of infection. The lowest concentration of SARS-CoV-2 viral copies this assay can detect is 138 copies/mL. A negative result does not preclude SARS-Cov-2 infection and should not be used as the sole basis for treatment or other patient management decisions. A negative result may occur with  improper specimen collection/handling, submission of specimen other than nasopharyngeal swab, presence of viral mutation(s) within the areas targeted by this assay, and inadequate number of viral copies(<138 copies/mL). A negative result must be combined with clinical observations, patient history, and epidemiological information. The expected result is Negative.  Fact Sheet for Patients:  EntrepreneurPulse.com.au  Fact Sheet for Healthcare Providers:  IncredibleEmployment.be  This test is no t yet approved or cleared by the [place]  States FDA and  has been authorized for detection and/or diagnosis of SARS-CoV-2 by FDA under an Emergency Use Authorization (EUA). This EUA will remain  in effect (meaning this test can be used) for the duration of the COVID-19 declaration under Section 564(b)(1) of the Act, 21 U.S.C.section 360bbb-3(b)(1), unless the authorization is terminated  or  revoked sooner.       Influenza A by PCR NEGATIVE NEGATIVE Final   Influenza B by PCR NEGATIVE NEGATIVE Final    Comment: (NOTE) The Xpert Xpress SARS-CoV-2/FLU/RSV plus assay is intended as an aid in the diagnosis of influenza from Nasopharyngeal swab specimens and should not be used as a sole basis for treatment. Nasal washings and aspirates are unacceptable for Xpert Xpress SARS-CoV-2/FLU/RSV testing.  Fact Sheet for Patients: EntrepreneurPulse.com.au  Fact Sheet for Healthcare Providers: IncredibleEmployment.be  This test is not yet approved or cleared by the [place] FDA and has been authorized for detection and/or diagnosis of SARS-CoV-2 by FDA under an Emergency Use Authorization (EUA). This EUA will remain in effect (meaning this test can be used) for the duration of the COVID-19 declaration under Section 564(b)(1) of the Act, 21 U.S.C. section 360bbb-3(b)(1), unless the authorization is terminated or revoked.  Performed at [facility], [address] [place], [place] [address]     Coagulation Studies: No results for input(s): LABPROT, INR in the last 72 hours.  Urinalysis: No results for input(s): COLORURINE, LABSPEC, PHURINE, GLUCOSEU, HGBUR, BILIRUBINUR, KETONESUR, PROTEINUR, UROBILINOGEN, NITRITE, LEUKOCYTESUR in the last 72 hours.  Invalid input(s): APPERANCEUR    Imaging: No results found.   Medications:     patiromer  16.8 g Oral Daily     Assessment/ Plan:  [name] is a 75 y.o. white female with hypertension, coronary artery disease status post CABG, congestive heart failure, atrial fibrillation, diabetes mellitus type II insulin dependent who presents to [facility] ED on 05/24/2021 for abnormal lab  Hyperkalemia: unclear of etiology, possibly type IV RTA.  - Give potassium binder today: patiromer - Continue low potassium diet - Patient to start torsemide 20mg  daily - Patient to return to  clinic on Monday for follow up labwork.   Chronic kidney disease stage IIIB with proteinuria. CKD is secondary to diabetes, hypertension and atherosclerosis. Creatinine and GFR remain at baseline.  - Avoid ACE-I/ARB due to hyperkalemia - Avoid mineral corticoid receptor agonist due to hyperkalemia  Hypertension: blood pressure at goal: 119/68. Home regimen to now include torsemide 20mg  po daily as above.  - Continue metoprolol  Hyponatremia: Suspect this will improve with initiation of loop diuretics as above.    LOS: 0 Lafern Brinkley 12/9/20223:37 PM

## 2021-05-24 NOTE — ED Provider Notes (Signed)
[facility] Emergency Department Provider Note   ____________________________________________    I have reviewed the triage vital signs and the nursing notes.   HISTORY  Chief Complaint Abnormal Lab     HPI Mckenzie Wright is a 75 y.o. female sent in for evaluation because of abnormal lab.  Patient was sent in for an elevated potassium of 6.8 reportedly, confirmed by review of records.  Patient reports since her CABG that was performed in October she has had an elevated potassium, she has been seeing nephrology for that.  Was seen yesterday, notified today of elevated potassium of 6.8 and referred to the emergency department.  She reports he feels fine and has no complaints.  No palpitations.  Reports taking her medications as prescribed  Past Medical History:  Diagnosis Date   Asthma    Diabetes ([place])    Hypertension    Stroke [facility])     Patient Active Problem List   Diagnosis Date Noted   NSTEMI (non-ST elevated myocardial infarction) ([place]) 03/17/2021   Hypomagnesemia 03/17/2021   Healthcare maintenance 01/12/2019   CAD (coronary artery disease) 05/10/2018   Secondary hyperparathyroidism of renal origin ([place]) 04/18/2018   Aortic atherosclerosis ([place]) 04/16/2018   Renal mass, left 01/07/2018   Type 2 diabetes mellitus with stage 4 chronic kidney disease, with long-term current use of insulin ([place]) 01/07/2018   Osteopenia of multiple sites 10/08/2017   Failed total hip arthroplasty ([place]) 10/14/2016   Pure hypercholesterolemia 05/03/2016   Anemia 06/29/2015   Tachycardia 06/28/2015   Acquired hypothyroidism 06/25/2015   CKD (chronic kidney disease), stage IV ([place]) 06/25/2015   Essential hypertension 06/25/2015   Status post total hip replacement, right 06/25/2015   TIA on medication 06/25/2015    Past Surgical History:  Procedure Laterality Date   LEFT HEART CATH AND CORONARY ANGIOGRAPHY N/A 03/18/2021   Procedure: LEFT HEART CATH AND  CORONARY ANGIOGRAPHY;  Surgeon: Corey Skains, MD;  Location: [facility] [facility];  Service: Cardiovascular;  Laterality: N/A;   TOTAL HIP ARTHROPLASTY  2000   TOTAL HIP ARTHROPLASTY  2002   TOTAL HIP ARTHROPLASTY  2017   TOTAL HIP ARTHROPLASTY  2018    Prior to Admission medications   Medication Sig Start Date End Date Taking? Authorizing Provider  aspirin EC 81 MG tablet Take 81 mg by mouth daily.    [provider]  Calcium Carbonate-Vitamin D 600-200 MG-UNIT TABS Take by mouth.    [provider]  heparin 25000 UT/250ML infusion Inject 1,200 Units/hr into the vein continuous. 03/18/21   Danford, Suann Larry, MD  insulin NPH-regular Human (70-30) 100 UNIT/ML injection Inject 50 Units into the skin daily with breakfast.    [provider]  insulin NPH-regular Human (70-30) 100 UNIT/ML injection Inject 25 Units into the skin daily with supper.    [provider]  levothyroxine (SYNTHROID) 88 MCG tablet Take by mouth. 11/29/19 11/28/20  [provider]  metoprolol tartrate (LOPRESSOR) 25 MG tablet Take 0.5 tablets (12.5 mg total) by mouth 2 (two) times daily. 03/18/21   Danford, Suann Larry, MD  rosuvastatin (CRESTOR) 20 MG tablet Take 1 tablet (20 mg total) by mouth daily at 6 (six) AM. 03/19/21   Danford, Suann Larry, MD  torsemide (DEMADEX) 20 MG tablet TAKE 1 TABLET BY MOUTH ONCE DAILY IN THE MORNING AS NEEDED FOR EDEMA IN LEGS 05/26/18   [provider]  vitamin B-12 (CYANOCOBALAMIN) 1000 MCG tablet Take 1,000 mcg by mouth  daily.    [provider]     Allergies Patient has no known allergies.  Family History  Problem Relation Age of Onset   Breast cancer Neg Hx     Social History Social History   Tobacco Use   Smoking status: Never   Smokeless tobacco: Never  Substance Use Topics   Alcohol use: Not Currently   Drug use: Never    Review of Systems  Constitutional: No fever/chills Eyes: No visual  changes.  ENT: No sore throat. Cardiovascular: Denies chest pain. Respiratory: Denies shortness of breath. Gastrointestinal: No abdominal pain.  Genitourinary: Negative for dysuria. Musculoskeletal: Negative for back pain. Skin: Negative for rash. Neurological: Negative for headaches    ____________________________________________   PHYSICAL EXAM:  VITAL SIGNS: ED Triage Vitals  Enc Vitals Group     BP 05/24/21 1123 119/68     Pulse Rate 05/24/21 1123 70     Resp 05/24/21 1123 16     Temp 05/24/21 1123 97.7 F (36.5 C)     Temp Source 05/24/21 1123 Oral     SpO2 05/24/21 1123 97 %     Weight 05/24/21 1054 73.3 kg (161 lb 9.6 oz)     Height 05/24/21 1054 1.702 m (5\' 7" )     Head Circumference --      Peak Flow --      Pain Score 05/24/21 1054 0     Pain Loc --      Pain Edu? --      Excl. in [place]? --     Constitutional: Alert and oriented. No acute distress. Pleasant and interactive Eyes: Conjunctivae are normal.  Head: Atraumatic.  Cardiovascular: Normal rate, regular rhythm. Grossly normal heart sounds.  Good peripheral circulation. Respiratory: Normal respiratory effort.  No retractions. Lungs CTAB. Gastrointestinal: Soft and nontender. No distention.  No CVA tenderness. Genitourinary: deferred Musculoskeletal: No lower extremity tenderness nor edema.  Warm and well perfused Neurologic:  Normal speech and language. No gross focal neurologic deficits are appreciated.  Skin:  Skin is warm, dry and intact. No rash noted. Psychiatric: Mood and affect are normal. Speech and behavior are normal.  ____________________________________________   LABS (all labs ordered are listed, but only abnormal results are displayed)  Labs Reviewed  BASIC METABOLIC PANEL - Abnormal; Notable for the following components:      Result Value   Sodium 134 (*)    Potassium 6.2 (*)    Glucose, Bld 107 (*)    Creatinine, Ser 1.32 (*)    Calcium 8.8 (*)    GFR, Estimated 42 (*)    All  other components within normal limits  CBC - Abnormal; Notable for the following components:   RDW 15.6 (*)    All other components within normal limits  TROPONIN I (HIGH SENSITIVITY)  TROPONIN I (HIGH SENSITIVITY)   ____________________________________________  EKG  ED ECG REPORT I, Lavonia Drafts, the attending physician, personally viewed and interpreted this ECG.  Date: 05/24/2021  Rhythm: normal sinus rhythm QRS Axis: normal Intervals: normal ST/T Wave abnormalities: Nonspecific change Narrative Interpretation: no evidence of acute ischemia  ____________________________________________  RADIOLOGY   ____________________________________________   PROCEDURES  Procedure(s) performed: No  Procedures   Critical Care performed: No ____________________________________________   INITIAL IMPRESSION / ASSESSMENT AND PLAN / ED COURSE  Pertinent labs & imaging results that were available during my care of the patient were reviewed by me and considered in my medical decision making (see chart for details).   Patient  sent in for evaluation for elevated potassium of 6.8.  She is asymptomatic.  EKG without evidence of significant changes.  Repeated BMP here, potassium is 6.2  Have consulted Dr. Juleen China of nephrology who will see the patient in the emergency department  ----------------------------------------- 2:59 PM on 05/24/2021 ----------------------------------------- Patient seen by Dr. Juleen China of nephrology, recommends 16.8 g of [place], discharge with close follow-up in his office for recheck    ____________________________________________   FINAL CLINICAL IMPRESSION(S) / ED DIAGNOSES  Final diagnoses:  Hyperkalemia        Note:  This document was prepared using Dragon voice recognition software and may include unintentional dictation errors.    Lavonia Drafts, MD 05/24/21 1459

## 2021-05-24 NOTE — ED Triage Notes (Signed)
Pt comes into the ED and was sent by her MD due to abnormal labs with an elevated K+.  Pt unsure what the level was.  Pt had bypass surgery in October.  Denies any chest pain, SHOB, or dizziness.  Pt in NAD at this time and denies any complaints.

## 2021-05-27 DIAGNOSIS — I10 Essential (primary) hypertension: Secondary | ICD-10-CM | POA: Diagnosis not present

## 2021-05-27 DIAGNOSIS — R609 Edema, unspecified: Secondary | ICD-10-CM | POA: Diagnosis not present

## 2021-05-27 DIAGNOSIS — N2581 Secondary hyperparathyroidism of renal origin: Secondary | ICD-10-CM | POA: Diagnosis not present

## 2021-05-27 DIAGNOSIS — N184 Chronic kidney disease, stage 4 (severe): Secondary | ICD-10-CM | POA: Diagnosis not present

## 2021-05-27 DIAGNOSIS — I4821 Permanent atrial fibrillation: Secondary | ICD-10-CM | POA: Diagnosis not present

## 2021-05-27 DIAGNOSIS — I509 Heart failure, unspecified: Secondary | ICD-10-CM | POA: Diagnosis not present

## 2021-05-29 DIAGNOSIS — E1122 Type 2 diabetes mellitus with diabetic chronic kidney disease: Secondary | ICD-10-CM | POA: Diagnosis not present

## 2021-05-29 DIAGNOSIS — N184 Chronic kidney disease, stage 4 (severe): Secondary | ICD-10-CM | POA: Diagnosis not present

## 2021-05-29 DIAGNOSIS — E875 Hyperkalemia: Secondary | ICD-10-CM | POA: Diagnosis not present

## 2021-05-29 DIAGNOSIS — I10 Essential (primary) hypertension: Secondary | ICD-10-CM | POA: Diagnosis not present

## 2021-05-29 DIAGNOSIS — D631 Anemia in chronic kidney disease: Secondary | ICD-10-CM | POA: Diagnosis not present

## 2021-05-30 DIAGNOSIS — I4892 Unspecified atrial flutter: Secondary | ICD-10-CM | POA: Diagnosis not present

## 2021-05-30 DIAGNOSIS — I4891 Unspecified atrial fibrillation: Secondary | ICD-10-CM | POA: Diagnosis not present

## 2021-05-30 DIAGNOSIS — Z48812 Encounter for surgical aftercare following surgery on the circulatory system: Secondary | ICD-10-CM | POA: Diagnosis not present

## 2021-05-30 DIAGNOSIS — N184 Chronic kidney disease, stage 4 (severe): Secondary | ICD-10-CM | POA: Diagnosis not present

## 2021-05-30 DIAGNOSIS — I2510 Atherosclerotic heart disease of native coronary artery without angina pectoris: Secondary | ICD-10-CM | POA: Diagnosis not present

## 2021-05-30 DIAGNOSIS — Z951 Presence of aortocoronary bypass graft: Secondary | ICD-10-CM | POA: Diagnosis not present

## 2021-05-30 DIAGNOSIS — E1122 Type 2 diabetes mellitus with diabetic chronic kidney disease: Secondary | ICD-10-CM | POA: Diagnosis not present

## 2021-05-30 DIAGNOSIS — I2109 ST elevation (STEMI) myocardial infarction involving other coronary artery of anterior wall: Secondary | ICD-10-CM | POA: Diagnosis not present

## 2021-05-30 DIAGNOSIS — I129 Hypertensive chronic kidney disease with stage 1 through stage 4 chronic kidney disease, or unspecified chronic kidney disease: Secondary | ICD-10-CM | POA: Diagnosis not present

## 2021-06-13 DIAGNOSIS — Z951 Presence of aortocoronary bypass graft: Secondary | ICD-10-CM | POA: Diagnosis not present

## 2021-06-13 DIAGNOSIS — N184 Chronic kidney disease, stage 4 (severe): Secondary | ICD-10-CM | POA: Diagnosis not present

## 2021-06-13 DIAGNOSIS — I700 Atherosclerosis of aorta: Secondary | ICD-10-CM | POA: Diagnosis not present

## 2021-06-13 DIAGNOSIS — Z9889 Other specified postprocedural states: Secondary | ICD-10-CM | POA: Diagnosis not present

## 2021-06-13 DIAGNOSIS — I10 Essential (primary) hypertension: Secondary | ICD-10-CM | POA: Diagnosis not present

## 2021-06-13 DIAGNOSIS — I4891 Unspecified atrial fibrillation: Secondary | ICD-10-CM | POA: Diagnosis not present

## 2021-06-13 DIAGNOSIS — I25119 Atherosclerotic heart disease of native coronary artery with unspecified angina pectoris: Secondary | ICD-10-CM | POA: Diagnosis not present

## 2021-06-18 DIAGNOSIS — E875 Hyperkalemia: Secondary | ICD-10-CM | POA: Diagnosis not present

## 2021-06-18 DIAGNOSIS — I10 Essential (primary) hypertension: Secondary | ICD-10-CM | POA: Diagnosis not present

## 2021-06-18 DIAGNOSIS — N184 Chronic kidney disease, stage 4 (severe): Secondary | ICD-10-CM | POA: Diagnosis not present

## 2021-06-18 DIAGNOSIS — E1122 Type 2 diabetes mellitus with diabetic chronic kidney disease: Secondary | ICD-10-CM | POA: Diagnosis not present

## 2021-06-18 DIAGNOSIS — D631 Anemia in chronic kidney disease: Secondary | ICD-10-CM | POA: Diagnosis not present

## 2021-06-27 DIAGNOSIS — R609 Edema, unspecified: Secondary | ICD-10-CM | POA: Diagnosis not present

## 2021-06-27 DIAGNOSIS — E785 Hyperlipidemia, unspecified: Secondary | ICD-10-CM | POA: Diagnosis not present

## 2021-06-27 DIAGNOSIS — I2589 Other forms of chronic ischemic heart disease: Secondary | ICD-10-CM | POA: Diagnosis not present

## 2021-06-27 DIAGNOSIS — N2581 Secondary hyperparathyroidism of renal origin: Secondary | ICD-10-CM | POA: Diagnosis not present

## 2021-06-27 DIAGNOSIS — I10 Essential (primary) hypertension: Secondary | ICD-10-CM | POA: Diagnosis not present

## 2021-06-27 DIAGNOSIS — N281 Cyst of kidney, acquired: Secondary | ICD-10-CM | POA: Diagnosis not present

## 2021-06-27 DIAGNOSIS — N184 Chronic kidney disease, stage 4 (severe): Secondary | ICD-10-CM | POA: Diagnosis not present

## 2021-06-27 DIAGNOSIS — I509 Heart failure, unspecified: Secondary | ICD-10-CM | POA: Diagnosis not present

## 2021-06-27 DIAGNOSIS — I4821 Permanent atrial fibrillation: Secondary | ICD-10-CM | POA: Diagnosis not present

## 2021-07-03 DIAGNOSIS — I25119 Atherosclerotic heart disease of native coronary artery with unspecified angina pectoris: Secondary | ICD-10-CM | POA: Diagnosis not present

## 2021-07-03 DIAGNOSIS — I4891 Unspecified atrial fibrillation: Secondary | ICD-10-CM | POA: Diagnosis not present

## 2021-07-03 DIAGNOSIS — I700 Atherosclerosis of aorta: Secondary | ICD-10-CM | POA: Diagnosis not present

## 2021-07-03 DIAGNOSIS — N2581 Secondary hyperparathyroidism of renal origin: Secondary | ICD-10-CM | POA: Diagnosis not present

## 2021-07-03 DIAGNOSIS — E1122 Type 2 diabetes mellitus with diabetic chronic kidney disease: Secondary | ICD-10-CM | POA: Diagnosis not present

## 2021-07-03 DIAGNOSIS — Z794 Long term (current) use of insulin: Secondary | ICD-10-CM | POA: Diagnosis not present

## 2021-07-03 DIAGNOSIS — N184 Chronic kidney disease, stage 4 (severe): Secondary | ICD-10-CM | POA: Diagnosis not present

## 2021-07-03 DIAGNOSIS — E039 Hypothyroidism, unspecified: Secondary | ICD-10-CM | POA: Diagnosis not present

## 2021-07-16 DIAGNOSIS — N184 Chronic kidney disease, stage 4 (severe): Secondary | ICD-10-CM | POA: Diagnosis not present

## 2021-07-16 DIAGNOSIS — I509 Heart failure, unspecified: Secondary | ICD-10-CM | POA: Diagnosis not present

## 2021-07-16 DIAGNOSIS — I10 Essential (primary) hypertension: Secondary | ICD-10-CM | POA: Diagnosis not present

## 2021-07-16 DIAGNOSIS — R609 Edema, unspecified: Secondary | ICD-10-CM | POA: Diagnosis not present

## 2021-07-16 DIAGNOSIS — I4821 Permanent atrial fibrillation: Secondary | ICD-10-CM | POA: Diagnosis not present

## 2021-07-16 DIAGNOSIS — N281 Cyst of kidney, acquired: Secondary | ICD-10-CM | POA: Diagnosis not present

## 2021-07-16 DIAGNOSIS — N2581 Secondary hyperparathyroidism of renal origin: Secondary | ICD-10-CM | POA: Diagnosis not present

## 2021-07-16 DIAGNOSIS — E785 Hyperlipidemia, unspecified: Secondary | ICD-10-CM | POA: Diagnosis not present

## 2021-07-16 DIAGNOSIS — I2589 Other forms of chronic ischemic heart disease: Secondary | ICD-10-CM | POA: Diagnosis not present

## 2021-07-25 DIAGNOSIS — E785 Hyperlipidemia, unspecified: Secondary | ICD-10-CM | POA: Diagnosis not present

## 2021-07-25 DIAGNOSIS — I2589 Other forms of chronic ischemic heart disease: Secondary | ICD-10-CM | POA: Diagnosis not present

## 2021-07-25 DIAGNOSIS — N184 Chronic kidney disease, stage 4 (severe): Secondary | ICD-10-CM | POA: Diagnosis not present

## 2021-07-25 DIAGNOSIS — R609 Edema, unspecified: Secondary | ICD-10-CM | POA: Diagnosis not present

## 2021-07-25 DIAGNOSIS — I10 Essential (primary) hypertension: Secondary | ICD-10-CM | POA: Diagnosis not present

## 2021-07-25 DIAGNOSIS — N2581 Secondary hyperparathyroidism of renal origin: Secondary | ICD-10-CM | POA: Diagnosis not present

## 2021-07-25 DIAGNOSIS — I509 Heart failure, unspecified: Secondary | ICD-10-CM | POA: Diagnosis not present

## 2021-07-25 DIAGNOSIS — I4821 Permanent atrial fibrillation: Secondary | ICD-10-CM | POA: Diagnosis not present

## 2021-07-25 DIAGNOSIS — N281 Cyst of kidney, acquired: Secondary | ICD-10-CM | POA: Diagnosis not present

## 2021-09-19 DIAGNOSIS — R609 Edema, unspecified: Secondary | ICD-10-CM | POA: Diagnosis not present

## 2021-09-19 DIAGNOSIS — I509 Heart failure, unspecified: Secondary | ICD-10-CM | POA: Diagnosis not present

## 2021-09-19 DIAGNOSIS — N184 Chronic kidney disease, stage 4 (severe): Secondary | ICD-10-CM | POA: Diagnosis not present

## 2021-09-19 DIAGNOSIS — I10 Essential (primary) hypertension: Secondary | ICD-10-CM | POA: Diagnosis not present

## 2021-09-19 DIAGNOSIS — N281 Cyst of kidney, acquired: Secondary | ICD-10-CM | POA: Diagnosis not present

## 2021-09-19 DIAGNOSIS — I2589 Other forms of chronic ischemic heart disease: Secondary | ICD-10-CM | POA: Diagnosis not present

## 2021-09-19 DIAGNOSIS — R7309 Other abnormal glucose: Secondary | ICD-10-CM | POA: Diagnosis not present

## 2021-09-19 DIAGNOSIS — I4821 Permanent atrial fibrillation: Secondary | ICD-10-CM | POA: Diagnosis not present

## 2021-09-19 DIAGNOSIS — N2581 Secondary hyperparathyroidism of renal origin: Secondary | ICD-10-CM | POA: Diagnosis not present

## 2021-09-25 DIAGNOSIS — I509 Heart failure, unspecified: Secondary | ICD-10-CM | POA: Diagnosis not present

## 2021-09-25 DIAGNOSIS — N184 Chronic kidney disease, stage 4 (severe): Secondary | ICD-10-CM | POA: Diagnosis not present

## 2021-09-25 DIAGNOSIS — I2589 Other forms of chronic ischemic heart disease: Secondary | ICD-10-CM | POA: Diagnosis not present

## 2021-09-25 DIAGNOSIS — I10 Essential (primary) hypertension: Secondary | ICD-10-CM | POA: Diagnosis not present

## 2021-09-25 DIAGNOSIS — E785 Hyperlipidemia, unspecified: Secondary | ICD-10-CM | POA: Diagnosis not present

## 2021-09-25 DIAGNOSIS — R609 Edema, unspecified: Secondary | ICD-10-CM | POA: Diagnosis not present

## 2021-09-25 DIAGNOSIS — N2581 Secondary hyperparathyroidism of renal origin: Secondary | ICD-10-CM | POA: Diagnosis not present

## 2021-09-25 DIAGNOSIS — N281 Cyst of kidney, acquired: Secondary | ICD-10-CM | POA: Diagnosis not present

## 2021-09-25 DIAGNOSIS — I4821 Permanent atrial fibrillation: Secondary | ICD-10-CM | POA: Diagnosis not present

## 2021-10-10 DIAGNOSIS — I25119 Atherosclerotic heart disease of native coronary artery with unspecified angina pectoris: Secondary | ICD-10-CM | POA: Diagnosis not present

## 2021-10-10 DIAGNOSIS — Z9889 Other specified postprocedural states: Secondary | ICD-10-CM | POA: Diagnosis not present

## 2021-10-10 DIAGNOSIS — I10 Essential (primary) hypertension: Secondary | ICD-10-CM | POA: Diagnosis not present

## 2021-10-10 DIAGNOSIS — Z951 Presence of aortocoronary bypass graft: Secondary | ICD-10-CM | POA: Diagnosis not present

## 2021-10-10 DIAGNOSIS — I5022 Chronic systolic (congestive) heart failure: Secondary | ICD-10-CM | POA: Diagnosis not present

## 2021-10-10 DIAGNOSIS — I700 Atherosclerosis of aorta: Secondary | ICD-10-CM | POA: Diagnosis not present

## 2021-10-10 DIAGNOSIS — E7800 Pure hypercholesterolemia, unspecified: Secondary | ICD-10-CM | POA: Diagnosis not present

## 2021-10-10 DIAGNOSIS — E1122 Type 2 diabetes mellitus with diabetic chronic kidney disease: Secondary | ICD-10-CM | POA: Diagnosis not present

## 2021-10-10 DIAGNOSIS — I4891 Unspecified atrial fibrillation: Secondary | ICD-10-CM | POA: Diagnosis not present

## 2021-11-04 DIAGNOSIS — I25119 Atherosclerotic heart disease of native coronary artery with unspecified angina pectoris: Secondary | ICD-10-CM | POA: Diagnosis not present

## 2021-11-04 DIAGNOSIS — Z951 Presence of aortocoronary bypass graft: Secondary | ICD-10-CM | POA: Diagnosis not present

## 2021-11-04 DIAGNOSIS — R0602 Shortness of breath: Secondary | ICD-10-CM | POA: Diagnosis not present

## 2021-11-04 DIAGNOSIS — I5022 Chronic systolic (congestive) heart failure: Secondary | ICD-10-CM | POA: Diagnosis not present

## 2021-11-07 DIAGNOSIS — I25119 Atherosclerotic heart disease of native coronary artery with unspecified angina pectoris: Secondary | ICD-10-CM | POA: Diagnosis not present

## 2021-11-07 DIAGNOSIS — M19072 Primary osteoarthritis, left ankle and foot: Secondary | ICD-10-CM | POA: Diagnosis not present

## 2021-11-07 DIAGNOSIS — E1122 Type 2 diabetes mellitus with diabetic chronic kidney disease: Secondary | ICD-10-CM | POA: Diagnosis not present

## 2021-11-07 DIAGNOSIS — E039 Hypothyroidism, unspecified: Secondary | ICD-10-CM | POA: Diagnosis not present

## 2021-11-07 DIAGNOSIS — I700 Atherosclerosis of aorta: Secondary | ICD-10-CM | POA: Diagnosis not present

## 2021-11-07 DIAGNOSIS — Z794 Long term (current) use of insulin: Secondary | ICD-10-CM | POA: Diagnosis not present

## 2021-11-07 DIAGNOSIS — N184 Chronic kidney disease, stage 4 (severe): Secondary | ICD-10-CM | POA: Diagnosis not present

## 2021-11-07 DIAGNOSIS — I129 Hypertensive chronic kidney disease with stage 1 through stage 4 chronic kidney disease, or unspecified chronic kidney disease: Secondary | ICD-10-CM | POA: Diagnosis not present

## 2021-11-12 DIAGNOSIS — I10 Essential (primary) hypertension: Secondary | ICD-10-CM | POA: Diagnosis not present

## 2021-11-12 DIAGNOSIS — E1122 Type 2 diabetes mellitus with diabetic chronic kidney disease: Secondary | ICD-10-CM | POA: Diagnosis not present

## 2021-11-12 DIAGNOSIS — I25119 Atherosclerotic heart disease of native coronary artery with unspecified angina pectoris: Secondary | ICD-10-CM | POA: Diagnosis not present

## 2021-11-12 DIAGNOSIS — Z9889 Other specified postprocedural states: Secondary | ICD-10-CM | POA: Diagnosis not present

## 2021-11-12 DIAGNOSIS — E7800 Pure hypercholesterolemia, unspecified: Secondary | ICD-10-CM | POA: Diagnosis not present

## 2021-11-12 DIAGNOSIS — Z8679 Personal history of other diseases of the circulatory system: Secondary | ICD-10-CM | POA: Diagnosis not present

## 2021-11-12 DIAGNOSIS — Z951 Presence of aortocoronary bypass graft: Secondary | ICD-10-CM | POA: Diagnosis not present

## 2021-11-12 DIAGNOSIS — I700 Atherosclerosis of aorta: Secondary | ICD-10-CM | POA: Diagnosis not present

## 2021-11-12 DIAGNOSIS — N184 Chronic kidney disease, stage 4 (severe): Secondary | ICD-10-CM | POA: Diagnosis not present

## 2021-12-19 DIAGNOSIS — N184 Chronic kidney disease, stage 4 (severe): Secondary | ICD-10-CM | POA: Diagnosis not present

## 2021-12-19 DIAGNOSIS — I509 Heart failure, unspecified: Secondary | ICD-10-CM | POA: Diagnosis not present

## 2021-12-19 DIAGNOSIS — N2581 Secondary hyperparathyroidism of renal origin: Secondary | ICD-10-CM | POA: Diagnosis not present

## 2021-12-19 DIAGNOSIS — R609 Edema, unspecified: Secondary | ICD-10-CM | POA: Diagnosis not present

## 2021-12-19 DIAGNOSIS — R739 Hyperglycemia, unspecified: Secondary | ICD-10-CM | POA: Diagnosis not present

## 2021-12-19 DIAGNOSIS — I2589 Other forms of chronic ischemic heart disease: Secondary | ICD-10-CM | POA: Diagnosis not present

## 2021-12-19 DIAGNOSIS — I10 Essential (primary) hypertension: Secondary | ICD-10-CM | POA: Diagnosis not present

## 2021-12-19 DIAGNOSIS — N281 Cyst of kidney, acquired: Secondary | ICD-10-CM | POA: Diagnosis not present

## 2021-12-19 DIAGNOSIS — I4821 Permanent atrial fibrillation: Secondary | ICD-10-CM | POA: Diagnosis not present

## 2021-12-26 DIAGNOSIS — E785 Hyperlipidemia, unspecified: Secondary | ICD-10-CM | POA: Diagnosis not present

## 2021-12-26 DIAGNOSIS — N184 Chronic kidney disease, stage 4 (severe): Secondary | ICD-10-CM | POA: Diagnosis not present

## 2021-12-26 DIAGNOSIS — I4821 Permanent atrial fibrillation: Secondary | ICD-10-CM | POA: Diagnosis not present

## 2021-12-26 DIAGNOSIS — D631 Anemia in chronic kidney disease: Secondary | ICD-10-CM | POA: Diagnosis not present

## 2021-12-26 DIAGNOSIS — Z951 Presence of aortocoronary bypass graft: Secondary | ICD-10-CM | POA: Diagnosis not present

## 2021-12-26 DIAGNOSIS — E875 Hyperkalemia: Secondary | ICD-10-CM | POA: Diagnosis not present

## 2021-12-26 DIAGNOSIS — E1122 Type 2 diabetes mellitus with diabetic chronic kidney disease: Secondary | ICD-10-CM | POA: Diagnosis not present

## 2021-12-26 DIAGNOSIS — I10 Essential (primary) hypertension: Secondary | ICD-10-CM | POA: Diagnosis not present

## 2021-12-26 DIAGNOSIS — I509 Heart failure, unspecified: Secondary | ICD-10-CM | POA: Diagnosis not present

## 2022-01-20 DIAGNOSIS — Z951 Presence of aortocoronary bypass graft: Secondary | ICD-10-CM | POA: Diagnosis not present

## 2022-01-20 DIAGNOSIS — E1122 Type 2 diabetes mellitus with diabetic chronic kidney disease: Secondary | ICD-10-CM | POA: Diagnosis not present

## 2022-01-20 DIAGNOSIS — Z8679 Personal history of other diseases of the circulatory system: Secondary | ICD-10-CM | POA: Diagnosis not present

## 2022-01-20 DIAGNOSIS — I25119 Atherosclerotic heart disease of native coronary artery with unspecified angina pectoris: Secondary | ICD-10-CM | POA: Diagnosis not present

## 2022-01-20 DIAGNOSIS — I10 Essential (primary) hypertension: Secondary | ICD-10-CM | POA: Diagnosis not present

## 2022-01-20 DIAGNOSIS — R079 Chest pain, unspecified: Secondary | ICD-10-CM | POA: Diagnosis not present

## 2022-01-20 DIAGNOSIS — E7800 Pure hypercholesterolemia, unspecified: Secondary | ICD-10-CM | POA: Diagnosis not present

## 2022-01-20 DIAGNOSIS — N184 Chronic kidney disease, stage 4 (severe): Secondary | ICD-10-CM | POA: Diagnosis not present

## 2022-01-20 DIAGNOSIS — R0602 Shortness of breath: Secondary | ICD-10-CM | POA: Diagnosis not present

## 2022-02-06 DIAGNOSIS — I25119 Atherosclerotic heart disease of native coronary artery with unspecified angina pectoris: Secondary | ICD-10-CM | POA: Diagnosis not present

## 2022-02-06 DIAGNOSIS — R079 Chest pain, unspecified: Secondary | ICD-10-CM | POA: Diagnosis not present

## 2022-02-06 DIAGNOSIS — Z951 Presence of aortocoronary bypass graft: Secondary | ICD-10-CM | POA: Diagnosis not present

## 2022-02-13 DIAGNOSIS — E7800 Pure hypercholesterolemia, unspecified: Secondary | ICD-10-CM | POA: Diagnosis not present

## 2022-02-13 DIAGNOSIS — Z8679 Personal history of other diseases of the circulatory system: Secondary | ICD-10-CM | POA: Diagnosis not present

## 2022-02-13 DIAGNOSIS — I25119 Atherosclerotic heart disease of native coronary artery with unspecified angina pectoris: Secondary | ICD-10-CM | POA: Diagnosis not present

## 2022-02-13 DIAGNOSIS — Z951 Presence of aortocoronary bypass graft: Secondary | ICD-10-CM | POA: Diagnosis not present

## 2022-02-13 DIAGNOSIS — E1122 Type 2 diabetes mellitus with diabetic chronic kidney disease: Secondary | ICD-10-CM | POA: Diagnosis not present

## 2022-02-13 DIAGNOSIS — Z9889 Other specified postprocedural states: Secondary | ICD-10-CM | POA: Diagnosis not present

## 2022-02-13 DIAGNOSIS — I700 Atherosclerosis of aorta: Secondary | ICD-10-CM | POA: Diagnosis not present

## 2022-02-13 DIAGNOSIS — I10 Essential (primary) hypertension: Secondary | ICD-10-CM | POA: Diagnosis not present

## 2022-02-13 DIAGNOSIS — N184 Chronic kidney disease, stage 4 (severe): Secondary | ICD-10-CM | POA: Diagnosis not present

## 2022-03-12 DIAGNOSIS — Z1331 Encounter for screening for depression: Secondary | ICD-10-CM | POA: Diagnosis not present

## 2022-03-12 DIAGNOSIS — I129 Hypertensive chronic kidney disease with stage 1 through stage 4 chronic kidney disease, or unspecified chronic kidney disease: Secondary | ICD-10-CM | POA: Diagnosis not present

## 2022-03-12 DIAGNOSIS — Z23 Encounter for immunization: Secondary | ICD-10-CM | POA: Diagnosis not present

## 2022-03-12 DIAGNOSIS — Z0000 Encounter for general adult medical examination without abnormal findings: Secondary | ICD-10-CM | POA: Diagnosis not present

## 2022-03-12 DIAGNOSIS — N184 Chronic kidney disease, stage 4 (severe): Secondary | ICD-10-CM | POA: Diagnosis not present

## 2022-03-12 DIAGNOSIS — E039 Hypothyroidism, unspecified: Secondary | ICD-10-CM | POA: Diagnosis not present

## 2022-03-12 DIAGNOSIS — E1122 Type 2 diabetes mellitus with diabetic chronic kidney disease: Secondary | ICD-10-CM | POA: Diagnosis not present

## 2022-03-12 DIAGNOSIS — I25119 Atherosclerotic heart disease of native coronary artery with unspecified angina pectoris: Secondary | ICD-10-CM | POA: Diagnosis not present

## 2022-03-12 DIAGNOSIS — Z8679 Personal history of other diseases of the circulatory system: Secondary | ICD-10-CM | POA: Diagnosis not present

## 2022-03-27 DIAGNOSIS — E875 Hyperkalemia: Secondary | ICD-10-CM | POA: Diagnosis not present

## 2022-03-27 DIAGNOSIS — I10 Essential (primary) hypertension: Secondary | ICD-10-CM | POA: Diagnosis not present

## 2022-03-27 DIAGNOSIS — I4821 Permanent atrial fibrillation: Secondary | ICD-10-CM | POA: Diagnosis not present

## 2022-03-27 DIAGNOSIS — N184 Chronic kidney disease, stage 4 (severe): Secondary | ICD-10-CM | POA: Diagnosis not present

## 2022-03-27 DIAGNOSIS — I509 Heart failure, unspecified: Secondary | ICD-10-CM | POA: Diagnosis not present

## 2022-03-27 DIAGNOSIS — N2581 Secondary hyperparathyroidism of renal origin: Secondary | ICD-10-CM | POA: Diagnosis not present

## 2022-03-27 DIAGNOSIS — E785 Hyperlipidemia, unspecified: Secondary | ICD-10-CM | POA: Diagnosis not present

## 2022-03-27 DIAGNOSIS — E1122 Type 2 diabetes mellitus with diabetic chronic kidney disease: Secondary | ICD-10-CM | POA: Diagnosis not present

## 2022-03-27 DIAGNOSIS — R609 Edema, unspecified: Secondary | ICD-10-CM | POA: Diagnosis not present

## 2022-04-03 DIAGNOSIS — Z951 Presence of aortocoronary bypass graft: Secondary | ICD-10-CM | POA: Diagnosis not present

## 2022-04-03 DIAGNOSIS — E1122 Type 2 diabetes mellitus with diabetic chronic kidney disease: Secondary | ICD-10-CM | POA: Diagnosis not present

## 2022-04-03 DIAGNOSIS — N184 Chronic kidney disease, stage 4 (severe): Secondary | ICD-10-CM | POA: Diagnosis not present

## 2022-04-03 DIAGNOSIS — R609 Edema, unspecified: Secondary | ICD-10-CM | POA: Diagnosis not present

## 2022-04-03 DIAGNOSIS — D631 Anemia in chronic kidney disease: Secondary | ICD-10-CM | POA: Diagnosis not present

## 2022-04-03 DIAGNOSIS — I509 Heart failure, unspecified: Secondary | ICD-10-CM | POA: Diagnosis not present

## 2022-04-03 DIAGNOSIS — I4821 Permanent atrial fibrillation: Secondary | ICD-10-CM | POA: Diagnosis not present

## 2022-04-03 DIAGNOSIS — I10 Essential (primary) hypertension: Secondary | ICD-10-CM | POA: Diagnosis not present

## 2022-04-03 DIAGNOSIS — E785 Hyperlipidemia, unspecified: Secondary | ICD-10-CM | POA: Diagnosis not present

## 2022-04-29 DIAGNOSIS — H18423 Band keratopathy, bilateral: Secondary | ICD-10-CM | POA: Diagnosis not present

## 2022-04-29 DIAGNOSIS — H18513 Endothelial corneal dystrophy, bilateral: Secondary | ICD-10-CM | POA: Diagnosis not present

## 2022-06-18 DIAGNOSIS — I5022 Chronic systolic (congestive) heart failure: Secondary | ICD-10-CM | POA: Diagnosis not present

## 2022-06-18 DIAGNOSIS — I700 Atherosclerosis of aorta: Secondary | ICD-10-CM | POA: Diagnosis not present

## 2022-06-18 DIAGNOSIS — Z8679 Personal history of other diseases of the circulatory system: Secondary | ICD-10-CM | POA: Diagnosis not present

## 2022-06-18 DIAGNOSIS — E1122 Type 2 diabetes mellitus with diabetic chronic kidney disease: Secondary | ICD-10-CM | POA: Diagnosis not present

## 2022-06-18 DIAGNOSIS — E7800 Pure hypercholesterolemia, unspecified: Secondary | ICD-10-CM | POA: Diagnosis not present

## 2022-06-18 DIAGNOSIS — I25119 Atherosclerotic heart disease of native coronary artery with unspecified angina pectoris: Secondary | ICD-10-CM | POA: Diagnosis not present

## 2022-06-18 DIAGNOSIS — Z9889 Other specified postprocedural states: Secondary | ICD-10-CM | POA: Diagnosis not present

## 2022-06-18 DIAGNOSIS — Z951 Presence of aortocoronary bypass graft: Secondary | ICD-10-CM | POA: Diagnosis not present

## 2022-06-18 DIAGNOSIS — I10 Essential (primary) hypertension: Secondary | ICD-10-CM | POA: Diagnosis not present

## 2022-06-24 DIAGNOSIS — H18423 Band keratopathy, bilateral: Secondary | ICD-10-CM | POA: Diagnosis not present

## 2022-07-14 DIAGNOSIS — N2581 Secondary hyperparathyroidism of renal origin: Secondary | ICD-10-CM | POA: Diagnosis not present

## 2022-07-14 DIAGNOSIS — Z8679 Personal history of other diseases of the circulatory system: Secondary | ICD-10-CM | POA: Diagnosis not present

## 2022-07-14 DIAGNOSIS — N184 Chronic kidney disease, stage 4 (severe): Secondary | ICD-10-CM | POA: Diagnosis not present

## 2022-07-14 DIAGNOSIS — E039 Hypothyroidism, unspecified: Secondary | ICD-10-CM | POA: Diagnosis not present

## 2022-07-14 DIAGNOSIS — I25119 Atherosclerotic heart disease of native coronary artery with unspecified angina pectoris: Secondary | ICD-10-CM | POA: Diagnosis not present

## 2022-07-14 DIAGNOSIS — Z794 Long term (current) use of insulin: Secondary | ICD-10-CM | POA: Diagnosis not present

## 2022-07-14 DIAGNOSIS — I129 Hypertensive chronic kidney disease with stage 1 through stage 4 chronic kidney disease, or unspecified chronic kidney disease: Secondary | ICD-10-CM | POA: Diagnosis not present

## 2022-07-14 DIAGNOSIS — I700 Atherosclerosis of aorta: Secondary | ICD-10-CM | POA: Diagnosis not present

## 2022-07-14 DIAGNOSIS — E1122 Type 2 diabetes mellitus with diabetic chronic kidney disease: Secondary | ICD-10-CM | POA: Diagnosis not present

## 2022-07-29 DIAGNOSIS — I509 Heart failure, unspecified: Secondary | ICD-10-CM | POA: Diagnosis not present

## 2022-07-29 DIAGNOSIS — N2581 Secondary hyperparathyroidism of renal origin: Secondary | ICD-10-CM | POA: Diagnosis not present

## 2022-07-29 DIAGNOSIS — R609 Edema, unspecified: Secondary | ICD-10-CM | POA: Diagnosis not present

## 2022-07-29 DIAGNOSIS — I4821 Permanent atrial fibrillation: Secondary | ICD-10-CM | POA: Diagnosis not present

## 2022-07-29 DIAGNOSIS — I10 Essential (primary) hypertension: Secondary | ICD-10-CM | POA: Diagnosis not present

## 2022-07-29 DIAGNOSIS — E1122 Type 2 diabetes mellitus with diabetic chronic kidney disease: Secondary | ICD-10-CM | POA: Diagnosis not present

## 2022-07-29 DIAGNOSIS — E785 Hyperlipidemia, unspecified: Secondary | ICD-10-CM | POA: Diagnosis not present

## 2022-07-29 DIAGNOSIS — N184 Chronic kidney disease, stage 4 (severe): Secondary | ICD-10-CM | POA: Diagnosis not present

## 2022-07-29 DIAGNOSIS — E875 Hyperkalemia: Secondary | ICD-10-CM | POA: Diagnosis not present

## 2022-08-05 DIAGNOSIS — I4821 Permanent atrial fibrillation: Secondary | ICD-10-CM | POA: Diagnosis not present

## 2022-08-05 DIAGNOSIS — N184 Chronic kidney disease, stage 4 (severe): Secondary | ICD-10-CM | POA: Diagnosis not present

## 2022-08-05 DIAGNOSIS — I509 Heart failure, unspecified: Secondary | ICD-10-CM | POA: Diagnosis not present

## 2022-08-05 DIAGNOSIS — I10 Essential (primary) hypertension: Secondary | ICD-10-CM | POA: Diagnosis not present

## 2022-08-05 DIAGNOSIS — E785 Hyperlipidemia, unspecified: Secondary | ICD-10-CM | POA: Diagnosis not present

## 2022-08-05 DIAGNOSIS — E1122 Type 2 diabetes mellitus with diabetic chronic kidney disease: Secondary | ICD-10-CM | POA: Diagnosis not present

## 2022-08-05 DIAGNOSIS — Z951 Presence of aortocoronary bypass graft: Secondary | ICD-10-CM | POA: Diagnosis not present

## 2022-08-05 DIAGNOSIS — D631 Anemia in chronic kidney disease: Secondary | ICD-10-CM | POA: Diagnosis not present

## 2022-08-05 DIAGNOSIS — E875 Hyperkalemia: Secondary | ICD-10-CM | POA: Diagnosis not present

## 2022-09-08 DIAGNOSIS — H18421 Band keratopathy, right eye: Secondary | ICD-10-CM | POA: Diagnosis not present

## 2022-10-11 ENCOUNTER — Emergency Department: Payer: Medicare HMO

## 2022-10-11 ENCOUNTER — Emergency Department
Admission: EM | Admit: 2022-10-11 | Discharge: 2022-10-11 | Disposition: A | Discharge status: Home / Self Care | Payer: Medicare HMO | Attending: Emergency Medicine | Admitting: Emergency Medicine

## 2022-10-11 ENCOUNTER — Other Ambulatory Visit: Payer: Self-pay

## 2022-10-11 DIAGNOSIS — Z79899 Other long term (current) drug therapy: Secondary | ICD-10-CM | POA: Insufficient documentation

## 2022-10-11 DIAGNOSIS — W06XXXA Fall from bed, initial encounter: Secondary | ICD-10-CM | POA: Diagnosis not present

## 2022-10-11 DIAGNOSIS — T1490XA Injury, unspecified, initial encounter: Secondary | ICD-10-CM | POA: Diagnosis not present

## 2022-10-11 DIAGNOSIS — Z7982 Long term (current) use of aspirin: Secondary | ICD-10-CM | POA: Insufficient documentation

## 2022-10-11 DIAGNOSIS — R531 Weakness: Secondary | ICD-10-CM | POA: Diagnosis not present

## 2022-10-11 DIAGNOSIS — J45909 Unspecified asthma, uncomplicated: Secondary | ICD-10-CM | POA: Insufficient documentation

## 2022-10-11 DIAGNOSIS — S0003XA Contusion of scalp, initial encounter: Secondary | ICD-10-CM | POA: Diagnosis not present

## 2022-10-11 DIAGNOSIS — Z951 Presence of aortocoronary bypass graft: Secondary | ICD-10-CM | POA: Insufficient documentation

## 2022-10-11 DIAGNOSIS — S0101XA Laceration without foreign body of scalp, initial encounter: Secondary | ICD-10-CM | POA: Insufficient documentation

## 2022-10-11 DIAGNOSIS — N184 Chronic kidney disease, stage 4 (severe): Secondary | ICD-10-CM | POA: Diagnosis not present

## 2022-10-11 DIAGNOSIS — I10 Essential (primary) hypertension: Secondary | ICD-10-CM | POA: Diagnosis not present

## 2022-10-11 DIAGNOSIS — Z794 Long term (current) use of insulin: Secondary | ICD-10-CM | POA: Insufficient documentation

## 2022-10-11 DIAGNOSIS — I129 Hypertensive chronic kidney disease with stage 1 through stage 4 chronic kidney disease, or unspecified chronic kidney disease: Secondary | ICD-10-CM | POA: Insufficient documentation

## 2022-10-11 DIAGNOSIS — Z8673 Personal history of transient ischemic attack (TIA), and cerebral infarction without residual deficits: Secondary | ICD-10-CM | POA: Diagnosis not present

## 2022-10-11 DIAGNOSIS — R404 Transient alteration of awareness: Secondary | ICD-10-CM | POA: Diagnosis not present

## 2022-10-11 DIAGNOSIS — N390 Urinary tract infection, site not specified: Secondary | ICD-10-CM | POA: Insufficient documentation

## 2022-10-11 DIAGNOSIS — I2510 Atherosclerotic heart disease of native coronary artery without angina pectoris: Secondary | ICD-10-CM | POA: Insufficient documentation

## 2022-10-11 DIAGNOSIS — E039 Hypothyroidism, unspecified: Secondary | ICD-10-CM | POA: Insufficient documentation

## 2022-10-11 DIAGNOSIS — D72829 Elevated white blood cell count, unspecified: Secondary | ICD-10-CM | POA: Diagnosis not present

## 2022-10-11 DIAGNOSIS — S0990XA Unspecified injury of head, initial encounter: Secondary | ICD-10-CM

## 2022-10-11 DIAGNOSIS — W19XXXA Unspecified fall, initial encounter: Secondary | ICD-10-CM | POA: Diagnosis not present

## 2022-10-11 DIAGNOSIS — E11649 Type 2 diabetes mellitus with hypoglycemia without coma: Secondary | ICD-10-CM | POA: Insufficient documentation

## 2022-10-11 DIAGNOSIS — E162 Hypoglycemia, unspecified: Secondary | ICD-10-CM

## 2022-10-11 DIAGNOSIS — E161 Other hypoglycemia: Secondary | ICD-10-CM | POA: Diagnosis not present

## 2022-10-11 DIAGNOSIS — R55 Syncope and collapse: Secondary | ICD-10-CM | POA: Diagnosis not present

## 2022-10-11 DIAGNOSIS — E1122 Type 2 diabetes mellitus with diabetic chronic kidney disease: Secondary | ICD-10-CM | POA: Diagnosis not present

## 2022-10-11 DIAGNOSIS — Z043 Encounter for examination and observation following other accident: Secondary | ICD-10-CM | POA: Diagnosis not present

## 2022-10-11 LAB — URINALYSIS, ROUTINE W REFLEX MICROSCOPIC
Bilirubin Urine: NEGATIVE
Glucose, UA: 150 mg/dL — AB
Ketones, ur: NEGATIVE mg/dL
Nitrite: NEGATIVE
Protein, ur: NEGATIVE mg/dL
Specific Gravity, Urine: 1.008 (ref 1.005–1.030)
pH: 5 (ref 5.0–8.0)

## 2022-10-11 LAB — CBC WITH DIFFERENTIAL/PLATELET
Abs Immature Granulocytes: 0.06 10*3/uL (ref 0.00–0.07)
Basophils Absolute: 0 10*3/uL (ref 0.0–0.1)
Basophils Relative: 1 %
Eosinophils Absolute: 0 10*3/uL (ref 0.0–0.5)
Eosinophils Relative: 1 %
HCT: 43.7 % (ref 36.0–46.0)
Hemoglobin: 13.6 g/dL (ref 12.0–15.0)
Immature Granulocytes: 1 %
Lymphocytes Relative: 10 %
Lymphs Abs: 0.9 10*3/uL (ref 0.7–4.0)
MCH: 31.8 pg (ref 26.0–34.0)
MCHC: 31.1 g/dL (ref 30.0–36.0)
MCV: 102.1 fL — ABNORMAL HIGH (ref 80.0–100.0)
Monocytes Absolute: 0.7 10*3/uL (ref 0.1–1.0)
Monocytes Relative: 8 %
Neutro Abs: 7.2 10*3/uL (ref 1.7–7.7)
Neutrophils Relative %: 79 %
Platelets: 170 10*3/uL (ref 150–400)
RBC: 4.28 MIL/uL (ref 3.87–5.11)
RDW: 13.2 % (ref 11.5–15.5)
WBC: 8.9 10*3/uL (ref 4.0–10.5)
nRBC: 0 % (ref 0.0–0.2)

## 2022-10-11 LAB — COMPREHENSIVE METABOLIC PANEL
ALT: 22 U/L (ref 0–44)
AST: 23 U/L (ref 15–41)
Albumin: 3.8 g/dL (ref 3.5–5.0)
Alkaline Phosphatase: 114 U/L (ref 38–126)
Anion gap: 10 (ref 5–15)
BUN: 27 mg/dL — ABNORMAL HIGH (ref 8–23)
CO2: 27 mmol/L (ref 22–32)
Calcium: 9 mg/dL (ref 8.9–10.3)
Chloride: 105 mmol/L (ref 98–111)
Creatinine, Ser: 1.21 mg/dL — ABNORMAL HIGH (ref 0.44–1.00)
GFR, Estimated: 46 mL/min — ABNORMAL LOW (ref >60)
Glucose, Bld: 58 mg/dL — ABNORMAL LOW (ref 70–99)
Potassium: 3.7 mmol/L (ref 3.5–5.1)
Sodium: 142 mmol/L (ref 135–145)
Total Bilirubin: 0.7 mg/dL (ref 0.3–1.2)
Total Protein: 7.3 g/dL (ref 6.5–8.1)

## 2022-10-11 LAB — CBG MONITORING, ED
Glucose-Capillary: 40 mg/dL — CL (ref 70–99)
Glucose-Capillary: 128 mg/dL — ABNORMAL HIGH (ref 70–99)
Glucose-Capillary: 79 mg/dL (ref 70–99)
Glucose-Capillary: 69 mg/dL — ABNORMAL LOW (ref 70–99)
Glucose-Capillary: 118 mg/dL — ABNORMAL HIGH (ref 70–99)

## 2022-10-11 LAB — TROPONIN I (HIGH SENSITIVITY)
Troponin I (High Sensitivity): 9 ng/L (ref <18)
Troponin I (High Sensitivity): 9 ng/L (ref <18)

## 2022-10-11 LAB — PROTIME-INR
INR: 1.2 (ref 0.8–1.2)
Prothrombin Time: 15.3 seconds — ABNORMAL HIGH (ref 11.4–15.2)

## 2022-10-11 MED ORDER — SODIUM CHLORIDE 0.9 % IV SOLN
1.0000 g | Freq: Once | INTRAVENOUS | Status: AC
  Administered 2022-10-11: 1 g via INTRAVENOUS
  Filled 2022-10-11: qty 10

## 2022-10-11 MED ORDER — DEXTROSE 50 % IV SOLN
INTRAVENOUS | Status: AC
  Administered 2022-10-11: 50 mL via INTRAVENOUS
  Filled 2022-10-11: qty 50

## 2022-10-11 MED ORDER — DEXTROSE 50 % IV SOLN: 1.0000 | Freq: Once | INTRAVENOUS | Status: DC

## 2022-10-11 NOTE — ED Notes (Signed)
C Collar removed per verbal order from Dr. Dolores Frame

## 2022-10-11 NOTE — ED Triage Notes (Signed)
Pt BIB EMS for fall OOB, +head strike, unknown LOC or thinners. Pt is in C Collar, Dr. Dolores Frame is at bedside

## 2022-10-11 NOTE — Discharge Instructions (Addendum)
1.  Staple removal in 7 to 10 days. 2.  Take and finish antibiotic as prescribed. 3.  Skip your morning dose of insulin.  Check your blood sugar at noon before giving insulin. 4.  Return to the ER for worsening symptoms, persistent vomiting, difficulty breathing or other concerns.

## 2022-10-11 NOTE — ED Notes (Signed)
Washed pt's face and hair with warm water and peroxide

## 2022-10-11 NOTE — ED Notes (Signed)
Pt given applesauce, crackers, and diet cola

## 2022-10-11 NOTE — ED Provider Notes (Addendum)
[facility] Provider Note    Event Date/Time   First MD Initiated Contact with Patient 10/11/22 0425     (approximate)   History   Fall   HPI  Level V caveat: History limited by patient hard of hearing  Mckenzie Wright is a 77 y.o. female brought to the ED via EMS from home status post fall.  Patient got out of bed secondary to hypoglycemia and fell, striking the back of her head.  Denies LOC.  Denies anticoagulant use.  Tetanus up-to-date.  EMS reports glucose in the 50s upon their arrival.  Patient's husband gave her orange juice to drink; repeat blood sugar over 100s.  Presents with posterior scalp laceration.  Patient denies vision changes, neck pain, chest pain, shortness of breath, abdominal pain, nausea, vomiting or dizziness.     Past Medical History   Past Medical History:  Diagnosis Date   Asthma    Diabetes (HCC)    Hypertension    Stroke [facility])      Active Problem List   Patient Active Problem List   Diagnosis Date Noted   NSTEMI (non-ST elevated myocardial infarction) (HCC) 03/17/2021   Hypomagnesemia 03/17/2021   Healthcare maintenance 01/12/2019   CAD (coronary artery disease) 05/10/2018   Secondary hyperparathyroidism of renal origin (HCC) 04/18/2018   Aortic atherosclerosis (HCC) 04/16/2018   Renal mass, left 01/07/2018   Type 2 diabetes mellitus with stage 4 chronic kidney disease, with long-term current use of insulin (HCC) 01/07/2018   Osteopenia of multiple sites 10/08/2017   Failed total hip arthroplasty (HCC) 10/14/2016   Pure hypercholesterolemia 05/03/2016   Anemia 06/29/2015   Tachycardia 06/28/2015   Acquired hypothyroidism 06/25/2015   CKD (chronic kidney disease), stage IV (HCC) 06/25/2015   Essential hypertension 06/25/2015   Status post total hip replacement, right 06/25/2015   TIA on medication 06/25/2015     Past Surgical History   Past Surgical History:  Procedure Laterality Date   LEFT HEART CATH  AND CORONARY ANGIOGRAPHY N/A 03/18/2021   Procedure: LEFT HEART CATH AND CORONARY ANGIOGRAPHY;  Surgeon: Lamar Blinks, MD;  Location: [facility] INVASIVE CV LAB;  Service: Cardiovascular;  Laterality: N/A;   TOTAL HIP ARTHROPLASTY  2000   TOTAL HIP ARTHROPLASTY  2002   TOTAL HIP ARTHROPLASTY  2017   TOTAL HIP ARTHROPLASTY  2018     Home Medications   Prior to Admission medications   Medication Sig Start Date End Date Taking? Authorizing Provider  aspirin EC 81 MG tablet Take 81 mg by mouth daily.    [provider]  Calcium Carbonate-Vitamin D 600-200 MG-UNIT TABS Take by mouth.    [provider]  heparin 16109 UT/250ML infusion Inject 1,200 Units/hr into the vein continuous. 03/18/21   Danford, Earl Lites, MD  insulin NPH-regular Human (70-30) 100 UNIT/ML injection Inject 50 Units into the skin daily with breakfast.    [provider]  insulin NPH-regular Human (70-30) 100 UNIT/ML injection Inject 25 Units into the skin daily with supper.    [provider]  levothyroxine (SYNTHROID) 88 MCG tablet Take by mouth. 11/29/19 11/28/20  [provider]  metoprolol tartrate (LOPRESSOR) 25 MG tablet Take 0.5 tablets (12.5 mg total) by mouth 2 (two) times daily. 03/18/21   Danford, Earl Lites, MD  rosuvastatin (CRESTOR) 20 MG tablet Take 1 tablet (20 mg total) by mouth daily at 6 (six) AM. 03/19/21   Danford, Earl Lites, MD  torsemide (DEMADEX) 20 MG tablet TAKE  1 TABLET BY MOUTH ONCE DAILY IN THE MORNING AS NEEDED FOR EDEMA IN LEGS 05/26/18   [provider]  vitamin B-12 (CYANOCOBALAMIN) 1000 MCG tablet Take 1,000 mcg by mouth daily.    [provider]     Allergies  Patient has no known allergies.   Family History   Family History  Problem Relation Age of Onset   Breast cancer Neg Hx      Physical Exam  Triage Vital Signs: ED Triage Vitals  Enc Vitals Group     BP      Pulse      Resp      Temp      Temp src       SpO2      Weight      Height      Head Circumference      Peak Flow      Pain Score      Pain Loc      Pain Edu?      Excl. in GC?     Updated Vital Signs: BP (!) 114/49   Pulse 61   Temp 97.7 F (36.5 C) (Oral)   Resp 17   Ht 5\' 5"  (1.651 m)   Wt 75.3 kg   SpO2 100%   BMI 27.62 kg/m    General: Awake, no distress.  Very hard of hearing. CV:  RRR.  Good peripheral perfusion.  Resp:  Normal effort.  CTAB. Abd:  Nontender.  No distention.  Other:  1.5 cm V shaped right posterior parietal scalp laceration without active bleeding.Marland Kitchen  PERRL.  EOMI.  Nose is atraumatic.  No dental malocclusion.  No midline cervical spinal tenderness to palpation.  No step-offs or deformities.  Pelvis is stable.  Full range of bilateral hips without pain.   ED Results / Procedures / Treatments  Labs (all labs ordered are listed, but only abnormal results are displayed) Labs Reviewed  CBC WITH DIFFERENTIAL/PLATELET - Abnormal; Notable for the following components:      Result Value   MCV 102.1 (*)    All other components within normal limits  COMPREHENSIVE METABOLIC PANEL - Abnormal; Notable for the following components:   Glucose, Bld 58 (*)    BUN 27 (*)    Creatinine, Ser 1.21 (*)    GFR, Estimated 46 (*)    All other components within normal limits  URINALYSIS, ROUTINE W REFLEX MICROSCOPIC - Abnormal; Notable for the following components:   Color, Urine YELLOW (*)    APPearance HAZY (*)    Glucose, UA 150 (*)    Hgb urine dipstick MODERATE (*)    Leukocytes,Ua TRACE (*)    Bacteria, UA MANY (*)    All other components within normal limits  PROTIME-INR - Abnormal; Notable for the following components:   Prothrombin Time 15.3 (*)    All other components within normal limits  CBG MONITORING, ED - Abnormal; Notable for the following components:   Glucose-Capillary 40 (*)    All other components within normal limits  CBG MONITORING, ED - Abnormal; Notable for the following  components:   Glucose-Capillary 128 (*)    All other components within normal limits  CBG MONITORING, ED  TROPONIN I (HIGH SENSITIVITY)  TROPONIN I (HIGH SENSITIVITY)     EKG  ED ECG REPORT I, Othel Dicostanzo J, the attending physician, personally viewed and interpreted this ECG.   Date: 10/11/2022  EKG Time: 0517  Rate: 68  Rhythm: normal sinus  rhythm  Axis: Normal  Intervals:none  ST&T Change: Nonspecific    RADIOLOGY I have independently visualized and interpreted patient's CT scans and x-rays as well as noted the radiology interpretation:  CT head: No ICH  CT cervical spine: No acute traumatic injury  Chest x-ray: Cardiomegaly, atelectasis  Pelvis x-ray: No fracture/dislocation  Official radiology report(s): DG Chest Port 1 View  Result Date: 10/11/2022 CLINICAL DATA:  Fall.  Weakness. EXAM: PORTABLE CHEST 1 VIEW COMPARISON:  One-view chest x-ray 03/17/2021 FINDINGS: The heart is enlarged. Interval median sternotomy for CABG and deep drill clipping noted. Atherosclerotic calcifications are present at the aortic arch. Chronic underlying interstitial coarsening is again noted. Aeration at the right base is improved compared to the prior exam. Superimposed disease is suggested at the left base, representing asymmetric edema or atelectasis most likely. Degenerative changes are present both shoulders. No acute fractures are present. No pneumothorax is present. IMPRESSION: 1. Interval median sternotomy for CABG and deep drill clipping. 2. Cardiomegaly without failure. 3. Asymmetric interstitial pattern at the left lung base likely representing asymmetric edema or atelectasis. Infection is not excluded. Electronically Signed   By: Marin Roberts M.D.   On: 10/11/2022 05:13   DG Pelvis Portable  Result Date: 10/11/2022 CLINICAL DATA:  Fall with weakness. EXAM: PORTABLE PELVIS 1-2 VIEWS COMPARISON:  Scout image for CT scan 12/29/2017. FINDINGS: Bones are markedly demineralized.  SI joints and symphysis pubis unremarkable. No evidence for pubic ramus fracture. Status post bilateral hip replacement. No evidence for periprosthetic femur fracture or hardware complication. Round density projecting over the lower pelvis is probably a distended urinary bladder. IMPRESSION: 1. No acute bony findings. 2. Status post bilateral hip replacement without evidence for hardware complication. Electronically Signed   By: Kennith Center M.D.   On: 10/11/2022 05:12   CT Cervical Spine Wo Contrast  Result Date: 10/11/2022 CLINICAL DATA:  Poly trauma, blunt.  Head injury. EXAM: CT CERVICAL SPINE WITHOUT CONTRAST TECHNIQUE: Multidetector CT imaging of the cervical spine was performed without intravenous contrast. Multiplanar CT image reconstructions were also generated. RADIATION DOSE REDUCTION: This exam was performed according to the departmental dose-optimization program which includes automated exposure control, adjustment of the mA and/or kV according to patient size and/or use of iterative reconstruction technique. COMPARISON:  None Available. FINDINGS: Alignment: Minimal degenerative anterolisthesis is present at C4-5 at C7-T1. No other significant listhesis is present. Cervical lordosis is preserved. Skull base and vertebrae: The craniocervical junction is within normal limits. Vertebral body heights are normal. No acute fractures are present. Soft tissues and spinal canal: No prevertebral fluid or swelling. No visible canal hematoma. Disc levels: Asymmetric right-sided uncovertebral and facet hypertrophy results in mild right foraminal narrowing at C3-4 and C4-5. A leftward disc osteophyte complex contributes to mild left central and foraminal narrowing at C5-6. A prominent partially calcified soft disc protrusion is present centrally at C6-7 with mild central canal stenosis. The foramina are patent at this level. Upper chest: The lung apices are clear. The thoracic inlet is within normal limits.  IMPRESSION: 1. No acute fracture or traumatic subluxation. 2. Multilevel degenerative changes of the cervical spine as described. Electronically Signed   By: Marin Roberts M.D.   On: 10/11/2022 04:51   CT Head Wo Contrast  Result Date: 10/11/2022 CLINICAL DATA:  Poly trauma, want EXAM: CT HEAD WITHOUT CONTRAST TECHNIQUE: Contiguous axial images were obtained from the base of the skull through the vertex without intravenous contrast. RADIATION DOSE REDUCTION: This exam was performed according  to the departmental dose-optimization program which includes automated exposure control, adjustment of the mA and/or kV according to patient size and/or use of iterative reconstruction technique. COMPARISON:  None Available. FINDINGS: Brain: A remote white matter infarct is present in the anterior left corona radiata with associated ex vacuo dilation of the frontal horn of the left lateral ventricle. Generalized atrophy and white matter disease mildly advanced for age otherwise. No acute infarct, hemorrhage, or mass lesion is present. Deep brain nuclei are within normal limits. The ventricles are proportionate to the degree of atrophy. No significant extraaxial fluid collection is present. The brainstem and cerebellum are within normal limits. Midline structures are within normal limits. Vascular: Atherosclerotic calcifications are present within the cavernous internal carotid arteries bilaterally and at the dural margin of both vertebral arteries. No hyperdense vessel is present Skull: A right occipital scalp laceration and hematoma is present without underlying fracture. No radiopaque foreign body is present. Calvarium is intact. No other significant extracranial soft tissue injury is present. Sinuses/Orbits: The paranasal sinuses and mastoid air cells are clear. Bilateral lens replacements are noted. Globes and orbits are otherwise unremarkable. IMPRESSION: 1. Right occipital scalp laceration and hematoma without  underlying fracture. 2. No acute intracranial abnormality. 3. Remote white matter infarct in the anterior left corona radiata with associated ex vacuo dilation of the frontal horn of the left lateral ventricle. 4. Generalized atrophy and white matter disease is mildly advanced for age. This likely reflects the sequela of chronic microvascular ischemia. Electronically Signed   By: Marin Roberts M.D.   On: 10/11/2022 04:48     PROCEDURES:  Critical Care performed: No  .1-3 Lead EKG Interpretation  Performed by: Irean Hong, MD Authorized by: Irean Hong, MD     Interpretation: normal     ECG rate:  70   ECG rate assessment: normal     Rhythm: sinus rhythm     Ectopy: none     Conduction: normal   Comments:     Patient placed on cardiac monitor to evaluate for arrhythmias .Marland KitchenLaceration Repair  Date/Time: 10/11/2022 7:07 AM  Performed by: Irean Hong, MD Authorized by: Irean Hong, MD   Consent:    Consent obtained:  Verbal   Consent given by:  Patient   Risks, benefits, and alternatives were discussed: yes     Risks discussed:  Infection, pain, need for additional repair, poor cosmetic result and poor wound healing   Alternatives discussed:  No treatment Anesthesia:    Anesthesia method:  Topical application   Topical anesthesia: PainEase. Laceration details:    Location:  Scalp   Scalp location:  R parietal   Length (cm):  1.5   Depth (mm):  2 Pre-procedure details:    Preparation:  Patient was prepped and draped in usual sterile fashion Treatment:    Area cleansed with:  Saline (hydrogen peroxide)   Amount of cleaning:  Standard   Irrigation method:  Tap Skin repair:    Repair method:  Staples   Number of staples:  4 Approximation:    Approximation:  Close Repair type:    Repair type:  Simple Post-procedure details:    Dressing:  Open (no dressing)   Procedure completion:  Tolerated well, no immediate complications    MEDICATIONS ORDERED IN  ED: Medications  cefTRIAXone (ROCEPHIN) 1 g in sodium chloride 0.9 % 100 mL IVPB (1 g Intravenous New Bag/Given 10/11/22 0657)     IMPRESSION / MDM / ASSESSMENT AND PLAN /  ED COURSE  I reviewed the triage vital signs and the nursing notes.                             77 year old female presenting with fall secondary to hypoglycemia resulting in scalp laceration.  Differential diagnosis includes but is not limited to ICH, ACS, metabolic, infectious etiologies, etc.  I personally reviewed patient's records and note a nephrology office visit on 08/05/2022 for follow-up diabetes.  Patient's presentation is most consistent with acute presentation with potential threat to life or bodily function.  The patient is on the cardiac monitor to evaluate for evidence of arrhythmia and/or significant heart rate changes.  Will obtain lab work, UA, CT head and cervical spine.  X-ray chest and pelvis.  Will reassess.  Clinical Course as of 10/11/22 0709  Sat Oct 11, 2022  1610 Blood glucose 40.  Will administer D50. [JS]  [id number] Blood sugar 128 after administration of D50.  Recheck now in the 70s.  Husband at bedside who states blood sugar frequently runs low and 70s is normal for the patient.  She is currently drinking orange juice.  Head wound was cleansed and stapled.  Patient tolerated procedure well.  Care will be transferred to the oncoming provider Dr. Vicente Males at change of shift pending repeat troponin and repeat blood sugar.  If blood sugar remains stable, anticipate patient may be discharged home as she requests; otherwise would admit for hypoglycemia.  IV Rocephin will be administered for leukocyte positive UTI. [JS]    Clinical Course User Index [JS] Irean Hong, MD     FINAL CLINICAL IMPRESSION(S) / ED DIAGNOSES   Final diagnoses:  Fall, initial encounter  Hypoglycemia  Scalp laceration, initial encounter  Lower urinary tract infectious disease  Injury of head, initial encounter     Rx  / DC Orders   ED Discharge Orders     None        Note:  This document was prepared using Dragon voice recognition software and may include unintentional dictation errors.   Irean Hong, MD 10/11/22 9604    Irean Hong, MD 10/11/22 [phone]

## 2022-10-16 ENCOUNTER — Telehealth: Payer: Self-pay

## 2022-10-16 NOTE — Telephone Encounter (Signed)
Transition Care Management Follow-up Telephone Call Date of discharge and from where: 10/10/2025, [facility] How have you been since you were released from the hospital? Patient stated she is feeling better but still lacks energy Any questions or concerns? No  Items Reviewed: Did the pt receive and understand the discharge instructions provided? Yes  Medications obtained and verified? Yes  Other? No  Any new allergies since your discharge? No  Dietary orders reviewed? Yes Do you have support at home? Yes   Follow up appointments reviewed:  PCP Hospital f/u appt confirmed?  Patient stated she an appointment 01/12/2023 with Einar Crow MD at [facility]  Scheduled to see  on  @ . Specialist Hospital f/u appt confirmed? No  Scheduled to see  on  @ . Are transportation arrangements needed? No  If their condition worsens, is the pt aware to call PCP or go to the Emergency Dept.? Yes Was the patient provided with contact information for the PCP's office or ED? Yes Was to pt encouraged to call back with questions or concerns? Yes  Meya Clutter [facility]  [facility] Population Health Community Resource Care Guide   [email or web address]  ?? [phone]   Website: [email or web address]

## 2022-10-18 ENCOUNTER — Encounter: Payer: Self-pay | Admitting: Intensive Care

## 2022-10-18 ENCOUNTER — Other Ambulatory Visit: Payer: Self-pay

## 2022-10-18 ENCOUNTER — Emergency Department
Admission: EM | Admit: 2022-10-18 | Discharge: 2022-10-18 | Disposition: A | Discharge status: Home / Self Care | Payer: Medicare HMO | Attending: Emergency Medicine | Admitting: Emergency Medicine

## 2022-10-18 DIAGNOSIS — E119 Type 2 diabetes mellitus without complications: Secondary | ICD-10-CM | POA: Diagnosis not present

## 2022-10-18 DIAGNOSIS — Z8673 Personal history of transient ischemic attack (TIA), and cerebral infarction without residual deficits: Secondary | ICD-10-CM | POA: Insufficient documentation

## 2022-10-18 DIAGNOSIS — I10 Essential (primary) hypertension: Secondary | ICD-10-CM | POA: Insufficient documentation

## 2022-10-18 DIAGNOSIS — J45909 Unspecified asthma, uncomplicated: Secondary | ICD-10-CM | POA: Diagnosis not present

## 2022-10-18 DIAGNOSIS — S0101XD Laceration without foreign body of scalp, subsequent encounter: Secondary | ICD-10-CM | POA: Diagnosis not present

## 2022-10-18 DIAGNOSIS — Z4802 Encounter for removal of sutures: Secondary | ICD-10-CM

## 2022-10-18 NOTE — ED Triage Notes (Signed)
Patient here for staple removal from head.

## 2022-10-18 NOTE — ED Notes (Signed)
Staples removed from scalp. No redness, drainage noted around wound.

## 2022-10-18 NOTE — ED Notes (Signed)
AVS provided to and discussed with patient and family member at bedside. Pt verbalizes understanding of discharge instructions and denies any questions or concerns at this time. Pt has ride home. Pt ambulated out of department independently with steady gait.  

## 2022-10-18 NOTE — ED Provider Notes (Signed)
[facility] Provider Note    Event Date/Time   First MD Initiated Contact with Patient 10/18/22 [phone]     (approximate)   History   No chief complaint on file.   HPI  Mckenzie Wright is a 77 y.o. female with history of diabetes, CVA, hypertension and asthma presents emergency department for staple removal.  Patient had a fall 7 days ago.  No problems today.  Just needs staples removed.  No difficulty with the suture line.      Physical Exam   Triage Vital Signs: ED Triage Vitals  Enc Vitals Group     BP 10/18/22 0746 128/89     Pulse Rate 10/18/22 0746 63     Resp 10/18/22 0746 16     Temp 10/18/22 0746 98.9 F (37.2 C)     Temp Source 10/18/22 0746 Oral     SpO2 10/18/22 0746 98 %     Weight 10/18/22 0742 165 lb (74.8 kg)     Height 10/18/22 0742 5\' 5"  (1.651 m)     Head Circumference --      Peak Flow --      Pain Score 10/18/22 0742 0     Pain Loc --      Pain Edu? --      Excl. in GC? --     Most recent vital signs: Vitals:   10/18/22 0746  BP: 128/89  Pulse: 63  Resp: 16  Temp: 98.9 F (37.2 C)  SpO2: 98%     General: Awake, no distress.   CV:  Good peripheral perfusion. regular rate and  rhythm Resp:  Normal effort.  Abd:  No distention.   Other:  Staples are intact, wound appears well-approximated, no drainage   ED Results / Procedures / Treatments   Labs (all labs ordered are listed, but only abnormal results are displayed) Labs Reviewed - No data to display   EKG     RADIOLOGY     PROCEDURES:   .Suture Removal  Date/Time: 10/18/2022 8:08 AM  Performed by: Faythe Ghee, PA-C Authorized by: Faythe Ghee, PA-C   Consent:    Consent obtained:  Verbal   Consent given by:  Patient   Risks, benefits, and alternatives were discussed: yes     Risks discussed:  Bleeding, pain and wound separation   Alternatives discussed:  Delayed treatment Universal protocol:    Procedure explained and questions  answered to patient or proxy's satisfaction: yes     Immediately prior to procedure, a time out was called: yes     Patient identity confirmed:  Verbally with patient Location:    Location:  Head/neck   Head/neck location:  Scalp Procedure details:    Wound appearance:  No signs of infection   Number of staples removed:  4 Post-procedure details:    Post-removal:  No dressing applied   Procedure completion:  Tolerated well, no immediate complications    MEDICATIONS ORDERED IN ED: Medications - No data to display   IMPRESSION / MDM / ASSESSMENT AND PLAN / ED COURSE  I reviewed the triage vital signs and the nursing notes.                              Differential diagnosis includes, but is not limited to, staple removal, wound check, wound dehiscence  Patient's presentation is most consistent with acute, uncomplicated illness.   Patient's wound  does not appear to be infected.  Remove 4 staples.  See procedure note.  Patient tolerated procedure well.  Discharged stable condition.  Follow-up with your regular doctor as needed.       FINAL CLINICAL IMPRESSION(S) / ED DIAGNOSES   Final diagnoses:  Encounter for staple removal     Rx / DC Orders   ED Discharge Orders     None        Note:  This document was prepared using Dragon voice recognition software and may include unintentional dictation errors.    Faythe Ghee, PA-C 10/18/22 Alesia Richards    Dionne Bucy, MD 10/18/22 [phone]

## 2022-11-20 DIAGNOSIS — N184 Chronic kidney disease, stage 4 (severe): Secondary | ICD-10-CM | POA: Diagnosis not present

## 2022-11-20 DIAGNOSIS — E1122 Type 2 diabetes mellitus with diabetic chronic kidney disease: Secondary | ICD-10-CM | POA: Diagnosis not present

## 2022-11-20 DIAGNOSIS — R609 Edema, unspecified: Secondary | ICD-10-CM | POA: Diagnosis not present

## 2022-11-20 DIAGNOSIS — I509 Heart failure, unspecified: Secondary | ICD-10-CM | POA: Diagnosis not present

## 2022-11-20 DIAGNOSIS — I10 Essential (primary) hypertension: Secondary | ICD-10-CM | POA: Diagnosis not present

## 2022-11-20 DIAGNOSIS — E785 Hyperlipidemia, unspecified: Secondary | ICD-10-CM | POA: Diagnosis not present

## 2022-11-20 DIAGNOSIS — E875 Hyperkalemia: Secondary | ICD-10-CM | POA: Diagnosis not present

## 2022-11-20 DIAGNOSIS — I4821 Permanent atrial fibrillation: Secondary | ICD-10-CM | POA: Diagnosis not present

## 2022-11-20 DIAGNOSIS — N2581 Secondary hyperparathyroidism of renal origin: Secondary | ICD-10-CM | POA: Diagnosis not present

## 2022-11-27 DIAGNOSIS — N184 Chronic kidney disease, stage 4 (severe): Secondary | ICD-10-CM | POA: Diagnosis not present

## 2022-11-27 DIAGNOSIS — E875 Hyperkalemia: Secondary | ICD-10-CM | POA: Diagnosis not present

## 2022-11-27 DIAGNOSIS — E1122 Type 2 diabetes mellitus with diabetic chronic kidney disease: Secondary | ICD-10-CM | POA: Diagnosis not present

## 2022-11-27 DIAGNOSIS — D631 Anemia in chronic kidney disease: Secondary | ICD-10-CM | POA: Diagnosis not present

## 2022-11-27 DIAGNOSIS — I4821 Permanent atrial fibrillation: Secondary | ICD-10-CM | POA: Diagnosis not present

## 2022-11-27 DIAGNOSIS — I509 Heart failure, unspecified: Secondary | ICD-10-CM | POA: Diagnosis not present

## 2022-11-27 DIAGNOSIS — E785 Hyperlipidemia, unspecified: Secondary | ICD-10-CM | POA: Diagnosis not present

## 2022-11-27 DIAGNOSIS — I10 Essential (primary) hypertension: Secondary | ICD-10-CM | POA: Diagnosis not present

## 2022-11-27 DIAGNOSIS — Z951 Presence of aortocoronary bypass graft: Secondary | ICD-10-CM | POA: Diagnosis not present

## 2022-12-22 DIAGNOSIS — I4891 Unspecified atrial fibrillation: Secondary | ICD-10-CM | POA: Diagnosis not present

## 2022-12-22 DIAGNOSIS — Z8679 Personal history of other diseases of the circulatory system: Secondary | ICD-10-CM | POA: Diagnosis not present

## 2022-12-22 DIAGNOSIS — Z951 Presence of aortocoronary bypass graft: Secondary | ICD-10-CM | POA: Diagnosis not present

## 2022-12-22 DIAGNOSIS — I700 Atherosclerosis of aorta: Secondary | ICD-10-CM | POA: Diagnosis not present

## 2022-12-22 DIAGNOSIS — I2510 Atherosclerotic heart disease of native coronary artery without angina pectoris: Secondary | ICD-10-CM | POA: Diagnosis not present

## 2022-12-22 DIAGNOSIS — I10 Essential (primary) hypertension: Secondary | ICD-10-CM | POA: Diagnosis not present

## 2022-12-22 DIAGNOSIS — E7800 Pure hypercholesterolemia, unspecified: Secondary | ICD-10-CM | POA: Diagnosis not present

## 2022-12-22 DIAGNOSIS — Z9889 Other specified postprocedural states: Secondary | ICD-10-CM | POA: Diagnosis not present

## 2022-12-29 DIAGNOSIS — H903 Sensorineural hearing loss, bilateral: Secondary | ICD-10-CM | POA: Diagnosis not present

## 2023-01-05 DIAGNOSIS — E113292 Type 2 diabetes mellitus with mild nonproliferative diabetic retinopathy without macular edema, left eye: Secondary | ICD-10-CM | POA: Diagnosis not present

## 2023-01-05 DIAGNOSIS — H524 Presbyopia: Secondary | ICD-10-CM | POA: Diagnosis not present

## 2023-01-12 DIAGNOSIS — N184 Chronic kidney disease, stage 4 (severe): Secondary | ICD-10-CM | POA: Diagnosis not present

## 2023-01-12 DIAGNOSIS — E039 Hypothyroidism, unspecified: Secondary | ICD-10-CM | POA: Diagnosis not present

## 2023-01-12 DIAGNOSIS — I25119 Atherosclerotic heart disease of native coronary artery with unspecified angina pectoris: Secondary | ICD-10-CM | POA: Diagnosis not present

## 2023-01-12 DIAGNOSIS — I129 Hypertensive chronic kidney disease with stage 1 through stage 4 chronic kidney disease, or unspecified chronic kidney disease: Secondary | ICD-10-CM | POA: Diagnosis not present

## 2023-01-12 DIAGNOSIS — Z0000 Encounter for general adult medical examination without abnormal findings: Secondary | ICD-10-CM | POA: Diagnosis not present

## 2023-01-12 DIAGNOSIS — I700 Atherosclerosis of aorta: Secondary | ICD-10-CM | POA: Diagnosis not present

## 2023-01-12 DIAGNOSIS — E7800 Pure hypercholesterolemia, unspecified: Secondary | ICD-10-CM | POA: Diagnosis not present

## 2023-01-12 DIAGNOSIS — Z794 Long term (current) use of insulin: Secondary | ICD-10-CM | POA: Diagnosis not present

## 2023-01-12 DIAGNOSIS — E1122 Type 2 diabetes mellitus with diabetic chronic kidney disease: Secondary | ICD-10-CM | POA: Diagnosis not present

## 2023-02-03 DIAGNOSIS — Z951 Presence of aortocoronary bypass graft: Secondary | ICD-10-CM | POA: Diagnosis not present

## 2023-02-03 DIAGNOSIS — Z9889 Other specified postprocedural states: Secondary | ICD-10-CM | POA: Diagnosis not present

## 2023-02-03 DIAGNOSIS — I10 Essential (primary) hypertension: Secondary | ICD-10-CM | POA: Diagnosis not present

## 2023-02-03 DIAGNOSIS — I5022 Chronic systolic (congestive) heart failure: Secondary | ICD-10-CM | POA: Diagnosis not present

## 2023-02-03 DIAGNOSIS — I25119 Atherosclerotic heart disease of native coronary artery with unspecified angina pectoris: Secondary | ICD-10-CM | POA: Diagnosis not present

## 2023-02-03 DIAGNOSIS — R5383 Other fatigue: Secondary | ICD-10-CM | POA: Diagnosis not present

## 2023-02-03 DIAGNOSIS — Z8679 Personal history of other diseases of the circulatory system: Secondary | ICD-10-CM | POA: Diagnosis not present

## 2023-02-03 DIAGNOSIS — E7800 Pure hypercholesterolemia, unspecified: Secondary | ICD-10-CM | POA: Diagnosis not present

## 2023-02-03 DIAGNOSIS — I700 Atherosclerosis of aorta: Secondary | ICD-10-CM | POA: Diagnosis not present

## 2023-02-19 DIAGNOSIS — I5022 Chronic systolic (congestive) heart failure: Secondary | ICD-10-CM | POA: Diagnosis not present

## 2023-02-19 DIAGNOSIS — Z951 Presence of aortocoronary bypass graft: Secondary | ICD-10-CM | POA: Diagnosis not present

## 2023-02-19 DIAGNOSIS — I25119 Atherosclerotic heart disease of native coronary artery with unspecified angina pectoris: Secondary | ICD-10-CM | POA: Diagnosis not present

## 2023-03-03 DIAGNOSIS — I10 Essential (primary) hypertension: Secondary | ICD-10-CM | POA: Diagnosis not present

## 2023-03-03 DIAGNOSIS — Z8679 Personal history of other diseases of the circulatory system: Secondary | ICD-10-CM | POA: Diagnosis not present

## 2023-03-03 DIAGNOSIS — Z951 Presence of aortocoronary bypass graft: Secondary | ICD-10-CM | POA: Diagnosis not present

## 2023-03-03 DIAGNOSIS — E7800 Pure hypercholesterolemia, unspecified: Secondary | ICD-10-CM | POA: Diagnosis not present

## 2023-03-03 DIAGNOSIS — I5022 Chronic systolic (congestive) heart failure: Secondary | ICD-10-CM | POA: Diagnosis not present

## 2023-03-03 DIAGNOSIS — I25119 Atherosclerotic heart disease of native coronary artery with unspecified angina pectoris: Secondary | ICD-10-CM | POA: Diagnosis not present

## 2023-03-03 DIAGNOSIS — R5383 Other fatigue: Secondary | ICD-10-CM | POA: Diagnosis not present

## 2023-03-03 DIAGNOSIS — I700 Atherosclerosis of aorta: Secondary | ICD-10-CM | POA: Diagnosis not present

## 2023-03-03 DIAGNOSIS — G479 Sleep disorder, unspecified: Secondary | ICD-10-CM | POA: Diagnosis not present

## 2023-03-05 ENCOUNTER — Encounter: Payer: Medicare HMO | Attending: Internal Medicine

## 2023-03-05 ENCOUNTER — Other Ambulatory Visit: Payer: Self-pay

## 2023-03-05 DIAGNOSIS — I5022 Chronic systolic (congestive) heart failure: Secondary | ICD-10-CM

## 2023-03-05 DIAGNOSIS — I5042 Chronic combined systolic (congestive) and diastolic (congestive) heart failure: Secondary | ICD-10-CM | POA: Insufficient documentation

## 2023-03-05 NOTE — Progress Notes (Signed)
Virtual Visit completed. Patient informed on EP and RD appointment and 6 Minute walk test. Patient also informed of patient health questionnaires on My Chart. Patient Verbalizes understanding. Visit diagnosis can be found in [facility] 02/03/2023.

## 2023-03-12 VITALS — Ht 66.0 in | Wt 166.8 lb

## 2023-03-12 DIAGNOSIS — I5022 Chronic systolic (congestive) heart failure: Secondary | ICD-10-CM

## 2023-03-12 DIAGNOSIS — I5042 Chronic combined systolic (congestive) and diastolic (congestive) heart failure: Secondary | ICD-10-CM | POA: Diagnosis not present

## 2023-03-12 NOTE — Progress Notes (Signed)
Pulmonary Individual Treatment Plan  Patient Details  Name: Mckenzie Wright MRN: [id number] Date of Birth: 10/20/1945 Referring Provider:   Doristine Devoid Pulmonary Rehab from 03/12/2023 in [facility] Cardiac and Pulmonary Rehab  Referring Provider Dr. Dorothyann Peng, MD       Initial Encounter Date:  Flowsheet Row Pulmonary Rehab from 03/12/2023 in [facility] Cardiac and Pulmonary Rehab  Date 03/12/23       Visit Diagnosis: Heart failure, chronic systolic (HCC)  Patient's Home Medications on Admission:  Current Outpatient Medications:    aspirin EC 81 MG tablet, Take 81 mg by mouth daily., Disp: , Rfl:    Calcium Carbonate-Vitamin D 600-200 MG-UNIT TABS, Take by mouth., Disp: , Rfl:    heparin 95621 UT/250ML infusion, Inject 1,200 Units/hr into the vein continuous. (Patient not taking: Reported on 03/05/2023), Disp: , Rfl:    insulin NPH-regular Human (70-30) 100 UNIT/ML injection, Inject 50 Units into the skin daily with breakfast., Disp: , Rfl:    insulin NPH-regular Human (70-30) 100 UNIT/ML injection, Inject 25 Units into the skin daily with supper. (Patient not taking: Reported on 03/05/2023), Disp: , Rfl:    levothyroxine (SYNTHROID) 88 MCG tablet, Take by mouth., Disp: , Rfl:    metoprolol tartrate (LOPRESSOR) 25 MG tablet, Take 0.5 tablets (12.5 mg total) by mouth 2 (two) times daily., Disp: , Rfl:    rosuvastatin (CRESTOR) 20 MG tablet, Take 1 tablet (20 mg total) by mouth daily at 6 (six) AM., Disp: , Rfl:    torsemide (DEMADEX) 20 MG tablet, TAKE 1 TABLET BY MOUTH ONCE DAILY IN THE MORNING AS NEEDED FOR EDEMA IN LEGS, Disp: , Rfl:    vitamin B-12 (CYANOCOBALAMIN) 1000 MCG tablet, Take 1,000 mcg by mouth daily., Disp: , Rfl:   Past Medical History: Past Medical History:  Diagnosis Date   Asthma    Diabetes (HCC)    Hypertension    Stroke (HCC)     Tobacco Use: Social History   Tobacco Use  Smoking Status Never  Smokeless Tobacco Never    Labs: Review Flowsheet        Latest Ref Rng & Units 03/16/2021 03/17/2021  Labs for ITP Cardiac and Pulmonary Rehab  Cholestrol 0 - 200 mg/dL - 308   LDL (calc) 0 - 99 mg/dL - 64   HDL-C >65 mg/dL - 41   Trlycerides <784 mg/dL - 696   Hemoglobin E9B 4.8 - 5.6 % 9.2  -  Bicarbonate 20.0 - 28.0 mmol/L 30.2  -  O2 Saturation % 43.2  -    Details             Pulmonary Assessment Scores:  Pulmonary Assessment Scores     Row Name 03/12/23 1048         ADL UCSD   ADL Phase Entry     SOB Score total 32     Rest 0     Walk 2     Stairs 2     Bath 0     Dress 0     Shop 2       CAT Score   CAT Score 6       mMRC Score   mMRC Score 1              UCSD: Self-administered rating of dyspnea associated with activities of daily living (ADLs) 6-point scale (0 = "not at all" to 5 = "maximal or unable to do because of breathlessness")  Scoring Scores range from  0 to 120.  Minimally important difference is 5 units  CAT: CAT can identify the health impairment of COPD patients and is better correlated with disease progression.  CAT has a scoring range of zero to 40. The CAT score is classified into four groups of low (less than 10), medium (10 - 20), high (21-30) and very high (31-40) based on the impact level of disease on health status. A CAT score over 10 suggests significant symptoms.  A worsening CAT score could be explained by an exacerbation, poor medication adherence, poor inhaler technique, or progression of COPD or comorbid conditions.  CAT MCID is 2 points  mMRC: mMRC (Modified Medical Research Council) Dyspnea Scale is used to assess the degree of baseline functional disability in patients of respiratory disease due to dyspnea. No minimal important difference is established. A decrease in score of 1 point or greater is considered a positive change.   Pulmonary Function Assessment:  Pulmonary Function Assessment - 03/05/23 0912       Breath   Shortness of Breath No             Exercise  Target Goals: Exercise Program Goal: Individual exercise prescription set using results from initial 6 min walk test and THRR while considering  patient's activity barriers and safety.   Exercise Prescription Goal: Initial exercise prescription builds to 30-45 minutes a day of aerobic activity, 2-3 days per week.  Home exercise guidelines will be given to patient during program as part of exercise prescription that the participant will acknowledge.  Education: Aerobic Exercise: - Group verbal and visual presentation on the components of exercise prescription. Introduces F.I.T.T principle from ACSM for exercise prescriptions.  Reviews F.I.T.T. principles of aerobic exercise including progression. Written material given at graduation. Flowsheet Row Pulmonary Rehab from 03/12/2023 in [facility] Cardiac and Pulmonary Rehab  Education need identified 03/12/23       Education: Resistance Exercise: - Group verbal and visual presentation on the components of exercise prescription. Introduces F.I.T.T principle from ACSM for exercise prescriptions  Reviews F.I.T.T. principles of resistance exercise including progression. Written material given at graduation.    Education: Exercise & Equipment Safety: - Individual verbal instruction and demonstration of equipment use and safety with use of the equipment. Flowsheet Row Pulmonary Rehab from 03/12/2023 in [facility] Cardiac and Pulmonary Rehab  Date 03/05/23  Educator [facility]  Instruction Review Code 1- Verbalizes Understanding       Education: Exercise Physiology & General Exercise Guidelines: - Group verbal and written instruction with models to review the exercise physiology of the cardiovascular system and associated critical values. Provides general exercise guidelines with specific guidelines to those with heart or lung disease.    Education: Flexibility, Balance, Mind/Body Relaxation: - Group verbal and visual presentation with interactive activity on the  components of exercise prescription. Introduces F.I.T.T principle from ACSM for exercise prescriptions. Reviews F.I.T.T. principles of flexibility and balance exercise training including progression. Also discusses the mind body connection.  Reviews various relaxation techniques to help reduce and manage stress (i.e. Deep breathing, progressive muscle relaxation, and visualization). Balance handout provided to take home. Written material given at graduation.   Activity Barriers & Risk Stratification:  Activity Barriers & Cardiac Risk Stratification - 03/12/23 1034       Activity Barriers & Cardiac Risk Stratification   Activity Barriers Deconditioning;Muscular Weakness;Balance Concerns;Other (comment)    Comments Hx of hip surgery    Cardiac Risk Stratification High  6 Minute Walk:  6 Minute Walk     Row Name 03/12/23 1029         6 Minute Walk   Phase Initial     Distance 755 feet     Walk Time 5.67 minutes     # of Rest Breaks 1     MPH 1.51     METS 1.31     RPE 12     Perceived Dyspnea  0     VO2 Peak 4.59     Symptoms No     Resting HR 69 bpm     Resting BP 112/54     Resting Oxygen Saturation  94 %     Exercise Oxygen Saturation  during 6 min walk 91 %     Max Ex. HR 87 bpm     Max Ex. BP 126/50     2 Minute Post BP 110/56       Interval HR   1 Minute HR 81     2 Minute HR 82     3 Minute HR 86     4 Minute HR 85     5 Minute HR 85     6 Minute HR 87     2 Minute Post HR 69     Interval Heart Rate? Yes       Interval Oxygen   Interval Oxygen? Yes     Baseline Oxygen Saturation % 94 %     1 Minute Oxygen Saturation % 92 %     1 Minute Liters of Oxygen 0 L  RA     2 Minute Oxygen Saturation % 92 %     2 Minute Liters of Oxygen 0 L     3 Minute Oxygen Saturation % 94 %     3 Minute Liters of Oxygen 0 L     4 Minute Oxygen Saturation % 97 %     4 Minute Liters of Oxygen 0 L     5 Minute Oxygen Saturation % 91 %     5 Minute Liters of  Oxygen 0 L     6 Minute Oxygen Saturation % 93 %     6 Minute Liters of Oxygen 0 L     2 Minute Post Oxygen Saturation % 95 %     2 Minute Post Liters of Oxygen 0 L             Oxygen Initial Assessment:  Oxygen Initial Assessment - 03/05/23 0912       Home Oxygen   Home Oxygen Device None    Sleep Oxygen Prescription None    Home Exercise Oxygen Prescription None    Home Resting Oxygen Prescription None      Initial 6 min Walk   Oxygen Used None      Program Oxygen Prescription   Program Oxygen Prescription None      Intervention   Short Term Goals To learn and exhibit compliance with exercise, home and travel O2 prescription;To learn and understand importance of monitoring SPO2 with pulse oximeter and demonstrate accurate use of the pulse oximeter.;To learn and understand importance of maintaining oxygen saturations>88%;To learn and demonstrate proper pursed lip breathing techniques or other breathing techniques.     Long  Term Goals Exhibits compliance with exercise, home  and travel O2 prescription;Verbalizes importance of monitoring SPO2 with pulse oximeter and return demonstration;Maintenance of O2 saturations>88%;Exhibits proper breathing techniques, such as pursed lip breathing or other  method taught during program session;Compliance with respiratory medication             Oxygen Re-Evaluation:   Oxygen Discharge (Final Oxygen Re-Evaluation):   Initial Exercise Prescription:  Initial Exercise Prescription - 03/12/23 1000       Date of Initial Exercise RX and Referring Provider   Date 03/12/23    Referring Provider Dr. Dorothyann Peng, MD      Oxygen   Maintain Oxygen Saturation 88% or higher      Recumbant Bike   Level 1    RPM 50    Watts 15    Minutes 15    METs 1.31      NuStep   Level 1    SPM 80    Minutes 15    METs 1.31      Biostep-RELP   Level 1    SPM 50    Minutes 15    METs 1.31      Track   Laps 12    Minutes 15    METs  1.65      Prescription Details   Frequency (times per week) 2    Duration Progress to 30 minutes of continuous aerobic without signs/symptoms of physical distress      Intensity   THRR 40-80% of Max Heartrate 97-126    Ratings of Perceived Exertion 11-13    Perceived Dyspnea 0-4      Progression   Progression Continue to progress workloads to maintain intensity without signs/symptoms of physical distress.      Resistance Training   Training Prescription Yes    Weight 2 lb    Reps 10-15             Perform Capillary Blood Glucose checks as needed.  Exercise Prescription Changes:   Exercise Prescription Changes     Row Name 03/12/23 1000             Response to Exercise   Blood Pressure (Admit) 112/54       Blood Pressure (Exercise) 126/50       Blood Pressure (Exit) 110/56       Heart Rate (Admit) 69 bpm       Heart Rate (Exercise) 87 bpm       Heart Rate (Exit) 69 bpm       Oxygen Saturation (Admit) 94 %       Oxygen Saturation (Exercise) 91 %       Oxygen Saturation (Exit) 95 %       Rating of Perceived Exertion (Exercise) 12       Perceived Dyspnea (Exercise) 0       Symptoms none       Comments Results                Exercise Comments:   Exercise Goals and Review:   Exercise Goals     Row Name 03/12/23 1035             Exercise Goals   Increase Physical Activity Yes       Intervention Provide advice, education, support and counseling about physical activity/exercise needs.;Develop an individualized exercise prescription for aerobic and resistive training based on initial evaluation findings, risk stratification, comorbidities and participant's personal goals.       Expected Outcomes Long Term: Add in home exercise to make exercise part of routine and to increase amount of physical activity.;Short Term: Attend rehab on a regular basis to increase amount of physical  activity.;Long Term: Exercising regularly at least 3-5 days a week.        Increase Strength and Stamina Yes       Intervention Provide advice, education, support and counseling about physical activity/exercise needs.;Develop an individualized exercise prescription for aerobic and resistive training based on initial evaluation findings, risk stratification, comorbidities and participant's personal goals.       Expected Outcomes Short Term: Increase workloads from initial exercise prescription for resistance, speed, and METs.;Short Term: Perform resistance training exercises routinely during rehab and add in resistance training at home;Long Term: Improve cardiorespiratory fitness, muscular endurance and strength as measured by increased METs and functional capacity ( )       Able to understand and use rate of perceived exertion (RPE) scale Yes       Intervention Provide education and explanation on how to use RPE scale       Expected Outcomes Short Term: Able to use RPE daily in rehab to express subjective intensity level;Long Term:  Able to use RPE to guide intensity level when exercising independently       Able to understand and use Dyspnea scale Yes       Intervention Provide education and explanation on how to use Dyspnea scale       Expected Outcomes Long Term: Able to use Dyspnea scale to guide intensity level when exercising independently;Short Term: Able to use Dyspnea scale daily in rehab to express subjective sense of shortness of breath during exertion       Knowledge and understanding of Target Heart Rate Range (THRR) Yes       Intervention Provide education and explanation of THRR including how the numbers were predicted and where they are located for reference       Expected Outcomes Short Term: Able to state/look up THRR;Long Term: Able to use THRR to govern intensity when exercising independently;Short Term: Able to use daily as guideline for intensity in rehab       Able to check pulse independently Yes       Intervention Provide education and  demonstration on how to check pulse in carotid and radial arteries.;Review the importance of being able to check your own pulse for safety during independent exercise       Expected Outcomes Short Term: Able to explain why pulse checking is important during independent exercise;Long Term: Able to check pulse independently and accurately       Understanding of Exercise Prescription Yes       Intervention Provide education, explanation, and written materials on patient's individual exercise prescription       Expected Outcomes Short Term: Able to explain program exercise prescription;Long Term: Able to explain home exercise prescription to exercise independently                Exercise Goals Re-Evaluation :   Discharge Exercise Prescription (Final Exercise Prescription Changes):  Exercise Prescription Changes - 03/12/23 1000       Response to Exercise   Blood Pressure (Admit) 112/54    Blood Pressure (Exercise) 126/50    Blood Pressure (Exit) 110/56    Heart Rate (Admit) 69 bpm    Heart Rate (Exercise) 87 bpm    Heart Rate (Exit) 69 bpm    Oxygen Saturation (Admit) 94 %    Oxygen Saturation (Exercise) 91 %    Oxygen Saturation (Exit) 95 %    Rating of Perceived Exertion (Exercise) 12    Perceived Dyspnea (Exercise) 0    Symptoms none  Comments Results             Nutrition:  Target Goals: Understanding of nutrition guidelines, daily intake of sodium 1500mg , cholesterol 200mg , calories 30% from fat and 7% or less from saturated fats, daily to have 5 or more servings of fruits and vegetables.  Education: All About Nutrition: -Group instruction provided by verbal, written material, interactive activities, discussions, models, and posters to present general guidelines for heart healthy nutrition including fat, fiber, MyPlate, the role of sodium in heart healthy nutrition, utilization of the nutrition label, and utilization of this knowledge for meal planning. Follow up  email sent as well. Written material given at graduation. Flowsheet Row Pulmonary Rehab from 03/12/2023 in [facility] Cardiac and Pulmonary Rehab  Education need identified 03/12/23       Biometrics:  Pre Biometrics - 03/12/23 1035       Pre Biometrics   Height 5\' 6"  (1.676 m)    Weight 166 lb 12.8 oz (75.7 kg)    Waist Circumference 35 inches    Hip Circumference 43 inches    Waist to Hip Ratio 0.81 %    BMI (Calculated) 26.94    Single Leg Stand 0 seconds              Nutrition Therapy Plan and Nutrition Goals:   Nutrition Assessments:  MEDIFICTS Score Key: >=70 Need to make dietary changes  40-70 Heart Healthy Diet <= 40 Therapeutic Level Cholesterol Diet  Flowsheet Row Pulmonary Rehab from 03/12/2023 in [facility] Cardiac and Pulmonary Rehab  Picture Your Plate Total Score on Admission 62      Picture Your Plate Scores: <43 Unhealthy dietary pattern with much room for improvement. 41-50 Dietary pattern unlikely to meet recommendations for good health and room for improvement. 51-60 More healthful dietary pattern, with some room for improvement.  >60 Healthy dietary pattern, although there may be some specific behaviors that could be improved.   Nutrition Goals Re-Evaluation:   Nutrition Goals Discharge (Final Nutrition Goals Re-Evaluation):   Psychosocial: Target Goals: Acknowledge presence or absence of significant depression and/or stress, maximize coping skills, provide positive support system. Participant is able to verbalize types and ability to use techniques and skills needed for reducing stress and depression.   Education: Stress, Anxiety, and Depression - Group verbal and visual presentation to define topics covered.  Reviews how body is impacted by stress, anxiety, and depression.  Also discusses healthy ways to reduce stress and to treat/manage anxiety and depression.  Written material given at graduation.   Education: Sleep Hygiene -Provides group  verbal and written instruction about how sleep can affect your health.  Define sleep hygiene, discuss sleep cycles and impact of sleep habits. Review good sleep hygiene tips.    Initial Review & Psychosocial Screening:  Initial Psych Review & Screening - 03/05/23 0915       Initial Review   Current issues with None Identified      Family Dynamics   Good Support System? Yes    Comments She can look to her falimy for support. She can look to her husband, son and daughter for support.      Barriers   Psychosocial barriers to participate in program The patient should benefit from training in stress management and relaxation.      Screening Interventions   Interventions To provide support and resources with identified psychosocial needs;Provide feedback about the scores to participant;Encouraged to exercise    Expected Outcomes Short Term goal: Utilizing psychosocial counselor,  staff and physician to assist with identification of specific Stressors or current issues interfering with healing process. Setting desired goal for each stressor or current issue identified.;Long Term Goal: Stressors or current issues are controlled or eliminated.;Short Term goal: Identification and review with participant of any Quality of Life or Depression concerns found by scoring the questionnaire.;Long Term goal: The participant improves quality of Life and PHQ9 Scores as seen by post scores and/or verbalization of changes             Quality of Life Scores:  Scores of 19 and below usually indicate a poorer quality of life in these areas.  A difference of  2-3 points is a clinically meaningful difference.  A difference of 2-3 points in the total score of the Quality of Life Index has been associated with significant improvement in overall quality of life, self-image, physical symptoms, and general health in studies assessing change in quality of life.  PHQ-9: Review Flowsheet       03/12/2023  Depression  screen PHQ 2/9  Decreased Interest 0  Down, Depressed, Hopeless 0  PHQ - 2 Score 0  Altered sleeping 0  Tired, decreased energy 1  Change in appetite 0  Feeling bad or failure about yourself  0  Trouble concentrating 0  Moving slowly or fidgety/restless 0  Suicidal thoughts 0  PHQ-9 Score 1  Difficult doing work/chores Not difficult at all    Details           Interpretation of Total Score  Total Score Depression Severity:  1-4 = Minimal depression, 5-9 = Mild depression, 10-14 = Moderate depression, 15-19 = Moderately severe depression, 20-27 = Severe depression   Psychosocial Evaluation and Intervention:  Psychosocial Evaluation - 03/05/23 0917       Psychosocial Evaluation & Interventions   Interventions Relaxation education;Stress management education;Encouraged to exercise with the program and follow exercise prescription    Comments She can look to her falimy for support. She can look to her husband, son and daughter for support.    Expected Outcomes Short: Start [facility] to help with mood. Long: Maintain a healthy mental state.    Continue Psychosocial Services  Follow up required by staff             Psychosocial Re-Evaluation:   Psychosocial Discharge (Final Psychosocial Re-Evaluation):   Education: Education Goals: Education classes will be provided on a weekly basis, covering required topics. Participant will state understanding/return demonstration of topics presented.  Learning Barriers/Preferences:  Learning Barriers/Preferences - 03/05/23 0913       Learning Barriers/Preferences   Learning Barriers None    Learning Preferences None             General Pulmonary Education Topics:  Infection Prevention: - Provides verbal and written material to individual with discussion of infection control including proper hand washing and proper equipment cleaning during exercise session. Flowsheet Row Pulmonary Rehab from 03/12/2023 in [facility] Cardiac  and Pulmonary Rehab  Date 03/05/23  Educator [facility]  Instruction Review Code 1- Verbalizes Understanding       Falls Prevention: - Provides verbal and written material to individual with discussion of falls prevention and safety. Flowsheet Row Pulmonary Rehab from 03/12/2023 in [facility] Cardiac and Pulmonary Rehab  Date 03/05/23  Educator [facility]  Instruction Review Code 1- Verbalizes Understanding       Chronic Lung Disease Review: - Group verbal instruction with posters, models, PowerPoint presentations and videos,  to review new updates, new respiratory medications, new  advancements in procedures and treatments. Providing information on websites and "800" numbers for continued self-education. Includes information about supplement oxygen, available portable oxygen systems, continuous and intermittent flow rates, oxygen safety, concentrators, and Medicare reimbursement for oxygen. Explanation of Pulmonary Drugs, including class, frequency, complications, importance of spacers, rinsing mouth after steroid MDI's, and proper cleaning methods for nebulizers. Review of basic lung anatomy and physiology related to function, structure, and complications of lung disease. Review of risk factors. Discussion about methods for diagnosing sleep apnea and types of masks and machines for OSA. Includes a review of the use of types of environmental controls: home humidity, furnaces, filters, dust mite/pet prevention, HEPA vacuums. Discussion about weather changes, air quality and the benefits of nasal washing. Instruction on Warning signs, infection symptoms, calling MD promptly, preventive modes, and value of vaccinations. Review of effective airway clearance, coughing and/or vibration techniques. Emphasizing that all should Create an Action Plan. Written material given at graduation.   AED/CPR: - Group verbal and written instruction with the use of models to demonstrate the basic use of the AED with the basic ABC's of  resuscitation.    Anatomy and Cardiac Procedures: - Group verbal and visual presentation and models provide information about basic cardiac anatomy and function. Reviews the testing methods done to diagnose heart disease and the outcomes of the test results. Describes the treatment choices: Medical Management, Angioplasty, or Coronary Bypass Surgery for treating various heart conditions including Myocardial Infarction, Angina, Valve Disease, and Cardiac Arrhythmias.  Written material given at graduation. Flowsheet Row Pulmonary Rehab from 03/12/2023 in [facility] Cardiac and Pulmonary Rehab  Education need identified 03/12/23       Medication Safety: - Group verbal and visual instruction to review commonly prescribed medications for heart and lung disease. Reviews the medication, class of the drug, and side effects. Includes the steps to properly store meds and maintain the prescription regimen.  Written material given at graduation.   Other: -Provides group and verbal instruction on various topics (see comments)   Knowledge Questionnaire Score:  Knowledge Questionnaire Score - 03/12/23 1051       Knowledge Questionnaire Score   Pre Score 19/26              Core Components/Risk Factors/Patient Goals at Admission:  Personal Goals and Risk Factors at Admission - 03/05/23 0913       Core Components/Risk Factors/Patient Goals on Admission    Weight Management Yes;Weight Loss    Intervention Weight Management: Develop a combined nutrition and exercise program designed to reach desired caloric intake, while maintaining appropriate intake of nutrient and fiber, sodium and fats, and appropriate energy expenditure required for the weight goal.;Weight Management: Provide education and appropriate resources to help participant work on and attain dietary goals.;Weight Management/Obesity: Establish reasonable short term and long term weight goals.    Expected Outcomes Short Term: Continue to  assess and modify interventions until short term weight is achieved;Long Term: Adherence to nutrition and physical activity/exercise program aimed toward attainment of established weight goal;Understanding recommendations for meals to include 15-35% energy as protein, 25-35% energy from fat, 35-60% energy from carbohydrates, less than 200mg  of dietary cholesterol, 20-35 gm of total fiber daily;Weight Loss: Understanding of general recommendations for a balanced deficit meal plan, which promotes 1-2 lb weight loss per week and includes a negative energy balance of [phone] kcal/d;Understanding of distribution of calorie intake throughout the day with the consumption of 4-5 meals/snacks    Improve shortness of breath with ADL's Yes  Intervention Provide education, individualized exercise plan and daily activity instruction to help decrease symptoms of SOB with activities of daily living.    Expected Outcomes Short Term: Improve cardiorespiratory fitness to achieve a reduction of symptoms when performing ADLs;Long Term: Be able to perform more ADLs without symptoms or delay the onset of symptoms    Diabetes Yes    Intervention Provide education about signs/symptoms and action to take for hypo/hyperglycemia.;Provide education about proper nutrition, including hydration, and aerobic/resistive exercise prescription along with prescribed medications to achieve blood glucose in normal ranges: Fasting glucose 65-99 mg/dL    Expected Outcomes Short Term: Participant verbalizes understanding of the signs/symptoms and immediate care of hyper/hypoglycemia, proper foot care and importance of medication, aerobic/resistive exercise and nutrition plan for blood glucose control.;Long Term: Attainment of HbA1C < 7%.    Heart Failure Yes    Intervention Provide a combined exercise and nutrition program that is supplemented with education, support and counseling about heart failure. Directed toward relieving symptoms such as  shortness of breath, decreased exercise tolerance, and extremity edema.    Expected Outcomes Improve functional capacity of life;Short term: Attendance in program 2-3 days a week with increased exercise capacity. Reported lower sodium intake. Reported increased fruit and vegetable intake. Reports medication compliance.;Short term: Daily weights obtained and reported for increase. Utilizing diuretic protocols set by physician.;Long term: Adoption of self-care skills and reduction of barriers for early signs and symptoms recognition and intervention leading to self-care maintenance.    Hypertension Yes    Intervention Provide education on lifestyle modifcations including regular physical activity/exercise, weight management, moderate sodium restriction and increased consumption of fresh fruit, vegetables, and low fat dairy, alcohol moderation, and smoking cessation.;Monitor prescription use compliance.    Expected Outcomes Short Term: Continued assessment and intervention until BP is < 140/38mm HG in hypertensive participants. < 130/61mm HG in hypertensive participants with diabetes, heart failure or chronic kidney disease.;Long Term: Maintenance of blood pressure at goal levels.    Lipids Yes    Intervention Provide education and support for participant on nutrition & aerobic/resistive exercise along with prescribed medications to achieve LDL 70mg , HDL >40mg .    Expected Outcomes Short Term: Participant states understanding of desired cholesterol values and is compliant with medications prescribed. Participant is following exercise prescription and nutrition guidelines.;Long Term: Cholesterol controlled with medications as prescribed, with individualized exercise RX and with personalized nutrition plan. Value goals: LDL < 70mg , HDL > 40 mg.             Education:Diabetes - Individual verbal and written instruction to review signs/symptoms of diabetes, desired ranges of glucose level fasting, after  meals and with exercise. Acknowledge that pre and post exercise glucose checks will be done for 3 sessions at entry of program. Flowsheet Row Pulmonary Rehab from 03/12/2023 in [facility] Cardiac [facility] Pul[facility]  Date 03/05/23  Educator [facility]  Instruction Review Code 1- Verbalizes Understanding       Know Your Numbers and Heart Failure: - Group verbal and visual instruction to discuss disease risk factors for cardiac and pulmonary disease and treatment options.  Reviews associated critical values for Overweight/Obesity, Hypertension, Cholesterol, and Diabetes.  Discusses basics of heart failure: signs/symptoms and treatments.  Introduces Heart Failure Zone chart for action plan for heart failure.  Written material given at graduation. Flowsheet Row Pulmonary Rehab from 03/12/2023 in [facility] Cardiac and Pulmonary Rehab  Education need identified 03/12/23       Core Components/Risk Factors/Patient Goals Review:    Core  Components/Risk Factors/Patient Goals at Discharge (Final Review):    ITP Comments:  ITP Comments     Row Name 03/05/23 0914 03/12/23 1027         ITP Comments Virtual Visit completed. Patient informed on EP and RD appointment and 6 Minute walk test. Patient also informed of patient health questionnaires on My Chart. Patient Verbalizes understanding. Visit diagnosis can be found in [facility] 02/03/2023. Completed and gym orientation. Initial ITP created and sent for review to Dr. Jinny Sanders, Medical Director.               Comments: Initial ITP

## 2023-03-12 NOTE — Patient Instructions (Signed)
Patient Instructions  Patient Details  Name: Mckenzie Wright MRN: [id number] Date of Birth: [birth year] Referring Provider:  Alwyn Pea, MD  Below are your personal goals for exercise, nutrition, and risk factors. Our goal is to help you stay on track towards obtaining and maintaining these goals. We will be discussing your progress on these goals with you throughout the program.  Initial Exercise Prescription:  Initial Exercise Prescription - 03/12/23 1000       Date of Initial Exercise RX and Referring Provider   Date 03/12/23    Referring Provider Dr. Dorothyann Peng, MD      Oxygen   Maintain Oxygen Saturation 88% or higher      Recumbant Bike   Level 1    RPM 50    Watts 15    Minutes 15    METs 1.31      NuStep   Level 1    SPM 80    Minutes 15    METs 1.31      Biostep-RELP   Level 1    SPM 50    Minutes 15    METs 1.31      Track   Laps 12    Minutes 15    METs 1.65      Prescription Details   Frequency (times per week) 2    Duration Progress to 30 minutes of continuous aerobic without signs/symptoms of physical distress      Intensity   THRR 40-80% of Max Heartrate 97-126    Ratings of Perceived Exertion 11-13    Perceived Dyspnea 0-4      Progression   Progression Continue to progress workloads to maintain intensity without signs/symptoms of physical distress.      Resistance Training   Training Prescription Yes    Weight 2 lb    Reps 10-15             Exercise Goals: Frequency: Be able to perform aerobic exercise two to three times per week in program working toward 2-5 days per week of home exercise.  Intensity: Work with a perceived exertion of 11 (fairly light) - 15 (hard) while following your exercise prescription.  We will make changes to your prescription with you as you progress through the program.   Duration: Be able to do 30 to 45 minutes of continuous aerobic exercise in addition to a 5 minute warm-up and a 5 minute  cool-down routine.   Nutrition Goals: Your personal nutrition goals will be established when you do your nutrition analysis with the dietician.  The following are general nutrition guidelines to follow: Cholesterol < 200mg /day Sodium < 1500mg /day Fiber: Women over 50 yrs - 21 grams per day  Personal Goals:  Personal Goals and Risk Factors at Admission - 03/05/23 0913       Core Components/Risk Factors/Patient Goals on Admission    Weight Management Yes;Weight Loss    Intervention Weight Management: Develop a combined nutrition and exercise program designed to reach desired caloric intake, while maintaining appropriate intake of nutrient and fiber, sodium and fats, and appropriate energy expenditure required for the weight goal.;Weight Management: Provide education and appropriate resources to help participant work on and attain dietary goals.;Weight Management/Obesity: Establish reasonable short term and long term weight goals.    Expected Outcomes Short Term: Continue to assess and modify interventions until short term weight is achieved;Long Term: Adherence to nutrition and physical activity/exercise program aimed toward attainment of established weight goal;Understanding recommendations for meals  to include 15-35% energy as protein, 25-35% energy from fat, 35-60% energy from carbohydrates, less than 200mg  of dietary cholesterol, 20-35 gm of total fiber daily;Weight Loss: Understanding of general recommendations for a balanced deficit meal plan, which promotes 1-2 lb weight loss per week and includes a negative energy balance of [phone] kcal/d;Understanding of distribution of calorie intake throughout the day with the consumption of 4-5 meals/snacks    Improve shortness of breath with ADL's Yes    Intervention Provide education, individualized exercise plan and daily activity instruction to help decrease symptoms of SOB with activities of daily living.    Expected Outcomes Short Term: Improve  cardiorespiratory fitness to achieve a reduction of symptoms when performing ADLs;Long Term: Be able to perform more ADLs without symptoms or delay the onset of symptoms    Diabetes Yes    Intervention Provide education about signs/symptoms and action to take for hypo/hyperglycemia.;Provide education about proper nutrition, including hydration, and aerobic/resistive exercise prescription along with prescribed medications to achieve blood glucose in normal ranges: Fasting glucose 65-99 mg/dL    Expected Outcomes Short Term: Participant verbalizes understanding of the signs/symptoms and immediate care of hyper/hypoglycemia, proper foot care and importance of medication, aerobic/resistive exercise and nutrition plan for blood glucose control.;Long Term: Attainment of HbA1C < 7%.    Heart Failure Yes    Intervention Provide a combined exercise and nutrition program that is supplemented with education, support and counseling about heart failure. Directed toward relieving symptoms such as shortness of breath, decreased exercise tolerance, and extremity edema.    Expected Outcomes Improve functional capacity of life;Short term: Attendance in program 2-3 days a week with increased exercise capacity. Reported lower sodium intake. Reported increased fruit and vegetable intake. Reports medication compliance.;Short term: Daily weights obtained and reported for increase. Utilizing diuretic protocols set by physician.;Long term: Adoption of self-care skills and reduction of barriers for early signs and symptoms recognition and intervention leading to self-care maintenance.    Hypertension Yes    Intervention Provide education on lifestyle modifcations including regular physical activity/exercise, weight management, moderate sodium restriction and increased consumption of fresh fruit, vegetables, and low fat dairy, alcohol moderation, and smoking cessation.;Monitor prescription use compliance.    Expected Outcomes Short  Term: Continued assessment and intervention until BP is < 140/55mm HG in hypertensive participants. < 130/19mm HG in hypertensive participants with diabetes, heart failure or chronic kidney disease.;Long Term: Maintenance of blood pressure at goal levels.    Lipids Yes    Intervention Provide education and support for participant on nutrition & aerobic/resistive exercise along with prescribed medications to achieve LDL 70mg , HDL >40mg .    Expected Outcomes Short Term: Participant states understanding of desired cholesterol values and is compliant with medications prescribed. Participant is following exercise prescription and nutrition guidelines.;Long Term: Cholesterol controlled with medications as prescribed, with individualized exercise RX and with personalized nutrition plan. Value goals: LDL < 70mg , HDL > 40 mg.            Exercise Goals and Review:  Exercise Goals     Row Name 03/12/23 1035             Exercise Goals   Increase Physical Activity Yes       Intervention Provide advice, education, support and counseling about physical activity/exercise needs.;Develop an individualized exercise prescription for aerobic and resistive training based on initial evaluation findings, risk stratification, comorbidities and participant's personal goals.       Expected Outcomes Long Term: Add in home exercise  to make exercise part of routine and to increase amount of physical activity.;Short Term: Attend rehab on a regular basis to increase amount of physical activity.;Long Term: Exercising regularly at least 3-5 days a week.       Increase Strength and Stamina Yes       Intervention Provide advice, education, support and counseling about physical activity/exercise needs.;Develop an individualized exercise prescription for aerobic and resistive training based on initial evaluation findings, risk stratification, comorbidities and participant's personal goals.       Expected Outcomes Short Term:  Increase workloads from initial exercise prescription for resistance, speed, and METs.;Short Term: Perform resistance training exercises routinely during rehab and add in resistance training at home;Long Term: Improve cardiorespiratory fitness, muscular endurance and strength as measured by increased METs and functional capacity ( )       Able to understand and use rate of perceived exertion (RPE) scale Yes       Intervention Provide education and explanation on how to use RPE scale       Expected Outcomes Short Term: Able to use RPE daily in rehab to express subjective intensity level;Long Term:  Able to use RPE to guide intensity level when exercising independently       Able to understand and use Dyspnea scale Yes       Intervention Provide education and explanation on how to use Dyspnea scale       Expected Outcomes Long Term: Able to use Dyspnea scale to guide intensity level when exercising independently;Short Term: Able to use Dyspnea scale daily in rehab to express subjective sense of shortness of breath during exertion       Knowledge and understanding of Target Heart Rate Range (THRR) Yes       Intervention Provide education and explanation of THRR including how the numbers were predicted and where they are located for reference       Expected Outcomes Short Term: Able to state/look up THRR;Long Term: Able to use THRR to govern intensity when exercising independently;Short Term: Able to use daily as guideline for intensity in rehab       Able to check pulse independently Yes       Intervention Provide education and demonstration on how to check pulse in carotid and radial arteries.;Review the importance of being able to check your own pulse for safety during independent exercise       Expected Outcomes Short Term: Able to explain why pulse checking is important during independent exercise;Long Term: Able to check pulse independently and accurately       Understanding of Exercise  Prescription Yes       Intervention Provide education, explanation, and written materials on patient's individual exercise prescription       Expected Outcomes Short Term: Able to explain program exercise prescription;Long Term: Able to explain home exercise prescription to exercise independently

## 2023-03-17 ENCOUNTER — Encounter: Payer: Medicare HMO | Attending: Internal Medicine | Admitting: *Deleted

## 2023-03-17 DIAGNOSIS — Z5189 Encounter for other specified aftercare: Secondary | ICD-10-CM | POA: Insufficient documentation

## 2023-03-17 DIAGNOSIS — I5022 Chronic systolic (congestive) heart failure: Secondary | ICD-10-CM | POA: Insufficient documentation

## 2023-03-17 LAB — GLUCOSE, CAPILLARY
Glucose-Capillary: 81 mg/dL (ref 70–99)
Glucose-Capillary: 126 mg/dL — ABNORMAL HIGH (ref 70–99)
Glucose-Capillary: 76 mg/dL (ref 70–99)

## 2023-03-17 NOTE — Progress Notes (Addendum)
Daily Session Note  Patient Details  Name: Mckenzie Wright MRN: [id number] Date of Birth: [birth year] Referring Provider:   Doristine Devoid Pulmonary Rehab from 03/12/2023 in [facility] Cardiac and Pulmonary Rehab  Referring Provider Dr. Dorothyann Peng, MD       Encounter Date: 03/17/2023  Check In:  Session Check In - 03/17/23 1010       Check-In   Supervising physician immediately available to respond to emergencies See telemetry face sheet for immediately available ER MD    Location [facility]-Cardiac & Pulmonary Rehab    Staff Present Cora Collum, RN, BSN, CCRP;Noah Tickle, BS, Exercise Physiologist;Maxon Conetta BS, , Exercise Physiologist    Virtual Visit No    Medication changes reported     No    Fall or balance concerns reported    No    Warm-up and Cool-down Did not exercise today  BS too low       VAD Patient? No    PAD/SET Patient? No      Pain Assessment   Currently in Pain? No/denies                Social History   Tobacco Use  Smoking Status Never  Smokeless Tobacco Never    Goals Met:   Goals Unmet:  Not Applicable  Comments:   Blood sugar too low to exercise   sent home with husband  Dr. Bethann Punches is Medical Director for [facility] Cardiac Rehabilitation.  Dr. Vida Rigger is Medical Director for [facility] Pulmonary Rehabilitation.

## 2023-03-18 ENCOUNTER — Encounter: Payer: Self-pay | Admitting: *Deleted

## 2023-03-18 DIAGNOSIS — I5022 Chronic systolic (congestive) heart failure: Secondary | ICD-10-CM

## 2023-03-18 NOTE — Progress Notes (Signed)
Pulmonary Individual Treatment Plan  Patient Details  Name: Mckenzie Wright MRN: [id number] Date of Birth: 1946/04/26 Referring Provider:   Doristine Devoid Pulmonary Rehab from 03/12/2023 in [facility] Cardiac and Pulmonary Rehab  Referring Provider Dr. Dorothyann Peng, MD       Initial Encounter Date:  Flowsheet Row Pulmonary Rehab from 03/12/2023 in [facility] Cardiac and Pulmonary Rehab  Date 03/12/23       Visit Diagnosis: Heart failure, chronic systolic (HCC)  Patient's Home Medications on Admission:  Current Outpatient Medications:    aspirin EC 81 MG tablet, Take 81 mg by mouth daily., Disp: , Rfl:    Calcium Carbonate-Vitamin D 600-200 MG-UNIT TABS, Take by mouth., Disp: , Rfl:    heparin 57846 UT/250ML infusion, Inject 1,200 Units/hr into the vein continuous. (Patient not taking: Reported on 03/05/2023), Disp: , Rfl:    insulin NPH-regular Human (70-30) 100 UNIT/ML injection, Inject 50 Units into the skin daily with breakfast., Disp: , Rfl:    insulin NPH-regular Human (70-30) 100 UNIT/ML injection, Inject 25 Units into the skin daily with supper. (Patient not taking: Reported on 03/05/2023), Disp: , Rfl:    levothyroxine (SYNTHROID) 88 MCG tablet, Take by mouth., Disp: , Rfl:    metoprolol tartrate (LOPRESSOR) 25 MG tablet, Take 0.5 tablets (12.5 mg total) by mouth 2 (two) times daily., Disp: , Rfl:    rosuvastatin (CRESTOR) 20 MG tablet, Take 1 tablet (20 mg total) by mouth daily at 6 (six) AM., Disp: , Rfl:    torsemide (DEMADEX) 20 MG tablet, TAKE 1 TABLET BY MOUTH ONCE DAILY IN THE MORNING AS NEEDED FOR EDEMA IN LEGS, Disp: , Rfl:    vitamin B-12 (CYANOCOBALAMIN) 1000 MCG tablet, Take 1,000 mcg by mouth daily., Disp: , Rfl:   Past Medical History: Past Medical History:  Diagnosis Date   Asthma    Diabetes (HCC)    Hypertension    Stroke (HCC)     Tobacco Use: Social History   Tobacco Use  Smoking Status Never  Smokeless Tobacco Never    Labs: Review Flowsheet        Latest Ref Rng & Units 03/16/2021 03/17/2021  Labs for ITP Cardiac and Pulmonary Rehab  Cholestrol 0 - 200 mg/dL - 962   LDL (calc) 0 - 99 mg/dL - 64   HDL-C >95 mg/dL - 41   Trlycerides <284 mg/dL - 132   Hemoglobin G4W 4.8 - 5.6 % 9.2  -  Bicarbonate 20.0 - 28.0 mmol/L 30.2  -  O2 Saturation % 43.2  -    Details             Pulmonary Assessment Scores:  Pulmonary Assessment Scores     Row Name 03/12/23 1048         ADL UCSD   ADL Phase Entry     SOB Score total 32     Rest 0     Walk 2     Stairs 2     Bath 0     Dress 0     Shop 2       CAT Score   CAT Score 6       mMRC Score   mMRC Score 1              UCSD: Self-administered rating of dyspnea associated with activities of daily living (ADLs) 6-point scale (0 = "not at all" to 5 = "maximal or unable to do because of breathlessness")  Scoring Scores range from  0 to 120.  Minimally important difference is 5 units  CAT: CAT can identify the health impairment of COPD patients and is better correlated with disease progression.  CAT has a scoring range of zero to 40. The CAT score is classified into four groups of low (less than 10), medium (10 - 20), high (21-30) and very high (31-40) based on the impact level of disease on health status. A CAT score over 10 suggests significant symptoms.  A worsening CAT score could be explained by an exacerbation, poor medication adherence, poor inhaler technique, or progression of COPD or comorbid conditions.  CAT MCID is 2 points  mMRC: mMRC (Modified Medical Research Council) Dyspnea Scale is used to assess the degree of baseline functional disability in patients of respiratory disease due to dyspnea. No minimal important difference is established. A decrease in score of 1 point or greater is considered a positive change.   Pulmonary Function Assessment:  Pulmonary Function Assessment - 03/05/23 0912       Breath   Shortness of Breath No             Exercise  Target Goals: Exercise Program Goal: Individual exercise prescription set using results from initial 6 min walk test and THRR while considering  patient's activity barriers and safety.   Exercise Prescription Goal: Initial exercise prescription builds to 30-45 minutes a day of aerobic activity, 2-3 days per week.  Home exercise guidelines will be given to patient during program as part of exercise prescription that the participant will acknowledge.  Education: Aerobic Exercise: - Group verbal and visual presentation on the components of exercise prescription. Introduces F.I.T.T principle from ACSM for exercise prescriptions.  Reviews F.I.T.T. principles of aerobic exercise including progression. Written material given at graduation. Flowsheet Row Pulmonary Rehab from 03/12/2023 in [facility] Cardiac and Pulmonary Rehab  Education need identified 03/12/23       Education: Resistance Exercise: - Group verbal and visual presentation on the components of exercise prescription. Introduces F.I.T.T principle from ACSM for exercise prescriptions  Reviews F.I.T.T. principles of resistance exercise including progression. Written material given at graduation.    Education: Exercise & Equipment Safety: - Individual verbal instruction and demonstration of equipment use and safety with use of the equipment. Flowsheet Row Pulmonary Rehab from 03/12/2023 in [facility] Cardiac and Pulmonary Rehab  Date 03/05/23  Educator [facility]  Instruction Review Code 1- Verbalizes Understanding       Education: Exercise Physiology & General Exercise Guidelines: - Group verbal and written instruction with models to review the exercise physiology of the cardiovascular system and associated critical values. Provides general exercise guidelines with specific guidelines to those with heart or lung disease.    Education: Flexibility, Balance, Mind/Body Relaxation: - Group verbal and visual presentation with interactive activity on the  components of exercise prescription. Introduces F.I.T.T principle from ACSM for exercise prescriptions. Reviews F.I.T.T. principles of flexibility and balance exercise training including progression. Also discusses the mind body connection.  Reviews various relaxation techniques to help reduce and manage stress (i.e. Deep breathing, progressive muscle relaxation, and visualization). Balance handout provided to take home. Written material given at graduation.   Activity Barriers & Risk Stratification:  Activity Barriers & Cardiac Risk Stratification - 03/12/23 1034       Activity Barriers & Cardiac Risk Stratification   Activity Barriers Deconditioning;Muscular Weakness;Balance Concerns;Other (comment)    Comments Hx of hip surgery    Cardiac Risk Stratification High  6 Minute Walk:  6 Minute Walk     Row Name 03/12/23 1029         6 Minute Walk   Phase Initial     Distance 755 feet     Walk Time 5.67 minutes     # of Rest Breaks 1     MPH 1.51     METS 1.31     RPE 12     Perceived Dyspnea  0     VO2 Peak 4.59     Symptoms No     Resting HR 69 bpm     Resting BP 112/54     Resting Oxygen Saturation  94 %     Exercise Oxygen Saturation  during 6 min walk 91 %     Max Ex. HR 87 bpm     Max Ex. BP 126/50     2 Minute Post BP 110/56       Interval HR   1 Minute HR 81     2 Minute HR 82     3 Minute HR 86     4 Minute HR 85     5 Minute HR 85     6 Minute HR 87     2 Minute Post HR 69     Interval Heart Rate? Yes       Interval Oxygen   Interval Oxygen? Yes     Baseline Oxygen Saturation % 94 %     1 Minute Oxygen Saturation % 92 %     1 Minute Liters of Oxygen 0 L  RA     2 Minute Oxygen Saturation % 92 %     2 Minute Liters of Oxygen 0 L     3 Minute Oxygen Saturation % 94 %     3 Minute Liters of Oxygen 0 L     4 Minute Oxygen Saturation % 97 %     4 Minute Liters of Oxygen 0 L     5 Minute Oxygen Saturation % 91 %     5 Minute Liters of  Oxygen 0 L     6 Minute Oxygen Saturation % 93 %     6 Minute Liters of Oxygen 0 L     2 Minute Post Oxygen Saturation % 95 %     2 Minute Post Liters of Oxygen 0 L             Oxygen Initial Assessment:  Oxygen Initial Assessment - 03/05/23 0912       Home Oxygen   Home Oxygen Device None    Sleep Oxygen Prescription None    Home Exercise Oxygen Prescription None    Home Resting Oxygen Prescription None      Initial 6 min Walk   Oxygen Used None      Program Oxygen Prescription   Program Oxygen Prescription None      Intervention   Short Term Goals To learn and exhibit compliance with exercise, home and travel O2 prescription;To learn and understand importance of monitoring SPO2 with pulse oximeter and demonstrate accurate use of the pulse oximeter.;To learn and understand importance of maintaining oxygen saturations>88%;To learn and demonstrate proper pursed lip breathing techniques or other breathing techniques.     Long  Term Goals Exhibits compliance with exercise, home  and travel O2 prescription;Verbalizes importance of monitoring SPO2 with pulse oximeter and return demonstration;Maintenance of O2 saturations>88%;Exhibits proper breathing techniques, such as pursed lip breathing or other  method taught during program session;Compliance with respiratory medication             Oxygen Re-Evaluation:   Oxygen Discharge (Final Oxygen Re-Evaluation):   Initial Exercise Prescription:  Initial Exercise Prescription - 03/12/23 1000       Date of Initial Exercise RX and Referring Provider   Date 03/12/23    Referring Provider Dr. Dorothyann Peng, MD      Oxygen   Maintain Oxygen Saturation 88% or higher      Recumbant Bike   Level 1    RPM 50    Watts 15    Minutes 15    METs 1.31      NuStep   Level 1    SPM 80    Minutes 15    METs 1.31      Biostep-RELP   Level 1    SPM 50    Minutes 15    METs 1.31      Track   Laps 12    Minutes 15    METs  1.65      Prescription Details   Frequency (times per week) 2    Duration Progress to 30 minutes of continuous aerobic without signs/symptoms of physical distress      Intensity   THRR 40-80% of Max Heartrate 97-126    Ratings of Perceived Exertion 11-13    Perceived Dyspnea 0-4      Progression   Progression Continue to progress workloads to maintain intensity without signs/symptoms of physical distress.      Resistance Training   Training Prescription Yes    Weight 2 lb    Reps 10-15             Perform Capillary Blood Glucose checks as needed.  Exercise Prescription Changes:   Exercise Prescription Changes     Row Name 03/12/23 1000             Response to Exercise   Blood Pressure (Admit) 112/54       Blood Pressure (Exercise) 126/50       Blood Pressure (Exit) 110/56       Heart Rate (Admit) 69 bpm       Heart Rate (Exercise) 87 bpm       Heart Rate (Exit) 69 bpm       Oxygen Saturation (Admit) 94 %       Oxygen Saturation (Exercise) 91 %       Oxygen Saturation (Exit) 95 %       Rating of Perceived Exertion (Exercise) 12       Perceived Dyspnea (Exercise) 0       Symptoms none       Comments Results                Exercise Comments:   Exercise Goals and Review:   Exercise Goals     Row Name 03/12/23 1035             Exercise Goals   Increase Physical Activity Yes       Intervention Provide advice, education, support and counseling about physical activity/exercise needs.;Develop an individualized exercise prescription for aerobic and resistive training based on initial evaluation findings, risk stratification, comorbidities and participant's personal goals.       Expected Outcomes Long Term: Add in home exercise to make exercise part of routine and to increase amount of physical activity.;Short Term: Attend rehab on a regular basis to increase amount of physical  activity.;Long Term: Exercising regularly at least 3-5 days a week.        Increase Strength and Stamina Yes       Intervention Provide advice, education, support and counseling about physical activity/exercise needs.;Develop an individualized exercise prescription for aerobic and resistive training based on initial evaluation findings, risk stratification, comorbidities and participant's personal goals.       Expected Outcomes Short Term: Increase workloads from initial exercise prescription for resistance, speed, and METs.;Short Term: Perform resistance training exercises routinely during rehab and add in resistance training at home;Long Term: Improve cardiorespiratory fitness, muscular endurance and strength as measured by increased METs and functional capacity ( )       Able to understand and use rate of perceived exertion (RPE) scale Yes       Intervention Provide education and explanation on how to use RPE scale       Expected Outcomes Short Term: Able to use RPE daily in rehab to express subjective intensity level;Long Term:  Able to use RPE to guide intensity level when exercising independently       Able to understand and use Dyspnea scale Yes       Intervention Provide education and explanation on how to use Dyspnea scale       Expected Outcomes Long Term: Able to use Dyspnea scale to guide intensity level when exercising independently;Short Term: Able to use Dyspnea scale daily in rehab to express subjective sense of shortness of breath during exertion       Knowledge and understanding of Target Heart Rate Range (THRR) Yes       Intervention Provide education and explanation of THRR including how the numbers were predicted and where they are located for reference       Expected Outcomes Short Term: Able to state/look up THRR;Long Term: Able to use THRR to govern intensity when exercising independently;Short Term: Able to use daily as guideline for intensity in rehab       Able to check pulse independently Yes       Intervention Provide education and  demonstration on how to check pulse in carotid and radial arteries.;Review the importance of being able to check your own pulse for safety during independent exercise       Expected Outcomes Short Term: Able to explain why pulse checking is important during independent exercise;Long Term: Able to check pulse independently and accurately       Understanding of Exercise Prescription Yes       Intervention Provide education, explanation, and written materials on patient's individual exercise prescription       Expected Outcomes Short Term: Able to explain program exercise prescription;Long Term: Able to explain home exercise prescription to exercise independently                Exercise Goals Re-Evaluation :   Discharge Exercise Prescription (Final Exercise Prescription Changes):  Exercise Prescription Changes - 03/12/23 1000       Response to Exercise   Blood Pressure (Admit) 112/54    Blood Pressure (Exercise) 126/50    Blood Pressure (Exit) 110/56    Heart Rate (Admit) 69 bpm    Heart Rate (Exercise) 87 bpm    Heart Rate (Exit) 69 bpm    Oxygen Saturation (Admit) 94 %    Oxygen Saturation (Exercise) 91 %    Oxygen Saturation (Exit) 95 %    Rating of Perceived Exertion (Exercise) 12    Perceived Dyspnea (Exercise) 0    Symptoms none  Comments Results             Nutrition:  Target Goals: Understanding of nutrition guidelines, daily intake of sodium 1500mg , cholesterol 200mg , calories 30% from fat and 7% or less from saturated fats, daily to have 5 or more servings of fruits and vegetables.  Education: All About Nutrition: -Group instruction provided by verbal, written material, interactive activities, discussions, models, and posters to present general guidelines for heart healthy nutrition including fat, fiber, MyPlate, the role of sodium in heart healthy nutrition, utilization of the nutrition label, and utilization of this knowledge for meal planning. Follow up  email sent as well. Written material given at graduation. Flowsheet Row Pulmonary Rehab from 03/12/2023 in [facility] Cardiac and Pulmonary Rehab  Education need identified 03/12/23       Biometrics:  Pre Biometrics - 03/12/23 1035       Pre Biometrics   Height 5\' 6"  (1.676 m)    Weight 166 lb 12.8 oz (75.7 kg)    Waist Circumference 35 inches    Hip Circumference 43 inches    Waist to Hip Ratio 0.81 %    BMI (Calculated) 26.94    Single Leg Stand 0 seconds              Nutrition Therapy Plan and Nutrition Goals:   Nutrition Assessments:  MEDIFICTS Score Key: >=70 Need to make dietary changes  40-70 Heart Healthy Diet <= 40 Therapeutic Level Cholesterol Diet  Flowsheet Row Pulmonary Rehab from 03/12/2023 in [facility] Cardiac and Pulmonary Rehab  Picture Your Plate Total Score on Admission 62      Picture Your Plate Scores: <16 Unhealthy dietary pattern with much room for improvement. 41-50 Dietary pattern unlikely to meet recommendations for good health and room for improvement. 51-60 More healthful dietary pattern, with some room for improvement.  >60 Healthy dietary pattern, although there may be some specific behaviors that could be improved.   Nutrition Goals Re-Evaluation:   Nutrition Goals Discharge (Final Nutrition Goals Re-Evaluation):   Psychosocial: Target Goals: Acknowledge presence or absence of significant depression and/or stress, maximize coping skills, provide positive support system. Participant is able to verbalize types and ability to use techniques and skills needed for reducing stress and depression.   Education: Stress, Anxiety, and Depression - Group verbal and visual presentation to define topics covered.  Reviews how body is impacted by stress, anxiety, and depression.  Also discusses healthy ways to reduce stress and to treat/manage anxiety and depression.  Written material given at graduation.   Education: Sleep Hygiene -Provides group  verbal and written instruction about how sleep can affect your health.  Define sleep hygiene, discuss sleep cycles and impact of sleep habits. Review good sleep hygiene tips.    Initial Review & Psychosocial Screening:  Initial Psych Review & Screening - 03/05/23 0915       Initial Review   Current issues with None Identified      Family Dynamics   Good Support System? Yes    Comments She can look to her falimy for support. She can look to her husband, son and daughter for support.      Barriers   Psychosocial barriers to participate in program The patient should benefit from training in stress management and relaxation.      Screening Interventions   Interventions To provide support and resources with identified psychosocial needs;Provide feedback about the scores to participant;Encouraged to exercise    Expected Outcomes Short Term goal: Utilizing psychosocial counselor,  staff and physician to assist with identification of specific Stressors or current issues interfering with healing process. Setting desired goal for each stressor or current issue identified.;Long Term Goal: Stressors or current issues are controlled or eliminated.;Short Term goal: Identification and review with participant of any Quality of Life or Depression concerns found by scoring the questionnaire.;Long Term goal: The participant improves quality of Life and PHQ9 Scores as seen by post scores and/or verbalization of changes             Quality of Life Scores:  Scores of 19 and below usually indicate a poorer quality of life in these areas.  A difference of  2-3 points is a clinically meaningful difference.  A difference of 2-3 points in the total score of the Quality of Life Index has been associated with significant improvement in overall quality of life, self-image, physical symptoms, and general health in studies assessing change in quality of life.  PHQ-9: Review Flowsheet       03/12/2023  Depression  screen PHQ 2/9  Decreased Interest 0  Down, Depressed, Hopeless 0  PHQ - 2 Score 0  Altered sleeping 0  Tired, decreased energy 1  Change in appetite 0  Feeling bad or failure about yourself  0  Trouble concentrating 0  Moving slowly or fidgety/restless 0  Suicidal thoughts 0  PHQ-9 Score 1  Difficult doing work/chores Not difficult at all    Details           Interpretation of Total Score  Total Score Depression Severity:  1-4 = Minimal depression, 5-9 = Mild depression, 10-14 = Moderate depression, 15-19 = Moderately severe depression, 20-27 = Severe depression   Psychosocial Evaluation and Intervention:  Psychosocial Evaluation - 03/05/23 0917       Psychosocial Evaluation & Interventions   Interventions Relaxation education;Stress management education;Encouraged to exercise with the program and follow exercise prescription    Comments She can look to her falimy for support. She can look to her husband, son and daughter for support.    Expected Outcomes Short: Start [facility] to help with mood. Long: Maintain a healthy mental state.    Continue Psychosocial Services  Follow up required by staff             Psychosocial Re-Evaluation:   Psychosocial Discharge (Final Psychosocial Re-Evaluation):   Education: Education Goals: Education classes will be provided on a weekly basis, covering required topics. Participant will state understanding/return demonstration of topics presented.  Learning Barriers/Preferences:  Learning Barriers/Preferences - 03/05/23 0913       Learning Barriers/Preferences   Learning Barriers None    Learning Preferences None             General Pulmonary Education Topics:  Infection Prevention: - Provides verbal and written material to individual with discussion of infection control including proper hand washing and proper equipment cleaning during exercise session. Flowsheet Row Pulmonary Rehab from 03/12/2023 in [facility] Cardiac  and Pulmonary Rehab  Date 03/05/23  Educator [facility]  Instruction Review Code 1- Verbalizes Understanding       Falls Prevention: - Provides verbal and written material to individual with discussion of falls prevention and safety. Flowsheet Row Pulmonary Rehab from 03/12/2023 in [facility] Cardiac and Pulmonary Rehab  Date 03/05/23  Educator [facility]  Instruction Review Code 1- Verbalizes Understanding       Chronic Lung Disease Review: - Group verbal instruction with posters, models, PowerPoint presentations and videos,  to review new updates, new respiratory medications, new  advancements in procedures and treatments. Providing information on websites and "800" numbers for continued self-education. Includes information about supplement oxygen, available portable oxygen systems, continuous and intermittent flow rates, oxygen safety, concentrators, and Medicare reimbursement for oxygen. Explanation of Pulmonary Drugs, including class, frequency, complications, importance of spacers, rinsing mouth after steroid MDI's, and proper cleaning methods for nebulizers. Review of basic lung anatomy and physiology related to function, structure, and complications of lung disease. Review of risk factors. Discussion about methods for diagnosing sleep apnea and types of masks and machines for OSA. Includes a review of the use of types of environmental controls: home humidity, furnaces, filters, dust mite/pet prevention, HEPA vacuums. Discussion about weather changes, air quality and the benefits of nasal washing. Instruction on Warning signs, infection symptoms, calling MD promptly, preventive modes, and value of vaccinations. Review of effective airway clearance, coughing and/or vibration techniques. Emphasizing that all should Create an Action Plan. Written material given at graduation.   AED/CPR: - Group verbal and written instruction with the use of models to demonstrate the basic use of the AED with the basic ABC's of  resuscitation.    Anatomy and Cardiac Procedures: - Group verbal and visual presentation and models provide information about basic cardiac anatomy and function. Reviews the testing methods done to diagnose heart disease and the outcomes of the test results. Describes the treatment choices: Medical Management, Angioplasty, or Coronary Bypass Surgery for treating various heart conditions including Myocardial Infarction, Angina, Valve Disease, and Cardiac Arrhythmias.  Written material given at graduation. Flowsheet Row Pulmonary Rehab from 03/12/2023 in [facility] Cardiac and Pulmonary Rehab  Education need identified 03/12/23       Medication Safety: - Group verbal and visual instruction to review commonly prescribed medications for heart and lung disease. Reviews the medication, class of the drug, and side effects. Includes the steps to properly store meds and maintain the prescription regimen.  Written material given at graduation.   Other: -Provides group and verbal instruction on various topics (see comments)   Knowledge Questionnaire Score:  Knowledge Questionnaire Score - 03/12/23 1051       Knowledge Questionnaire Score   Pre Score 19/26              Core Components/Risk Factors/Patient Goals at Admission:  Personal Goals and Risk Factors at Admission - 03/05/23 0913       Core Components/Risk Factors/Patient Goals on Admission    Weight Management Yes;Weight Loss    Intervention Weight Management: Develop a combined nutrition and exercise program designed to reach desired caloric intake, while maintaining appropriate intake of nutrient and fiber, sodium and fats, and appropriate energy expenditure required for the weight goal.;Weight Management: Provide education and appropriate resources to help participant work on and attain dietary goals.;Weight Management/Obesity: Establish reasonable short term and long term weight goals.    Expected Outcomes Short Term: Continue to  assess and modify interventions until short term weight is achieved;Long Term: Adherence to nutrition and physical activity/exercise program aimed toward attainment of established weight goal;Understanding recommendations for meals to include 15-35% energy as protein, 25-35% energy from fat, 35-60% energy from carbohydrates, less than 200mg  of dietary cholesterol, 20-35 gm of total fiber daily;Weight Loss: Understanding of general recommendations for a balanced deficit meal plan, which promotes 1-2 lb weight loss per week and includes a negative energy balance of [phone] kcal/d;Understanding of distribution of calorie intake throughout the day with the consumption of 4-5 meals/snacks    Improve shortness of breath with ADL's Yes  Intervention Provide education, individualized exercise plan and daily activity instruction to help decrease symptoms of SOB with activities of daily living.    Expected Outcomes Short Term: Improve cardiorespiratory fitness to achieve a reduction of symptoms when performing ADLs;Long Term: Be able to perform more ADLs without symptoms or delay the onset of symptoms    Diabetes Yes    Intervention Provide education about signs/symptoms and action to take for hypo/hyperglycemia.;Provide education about proper nutrition, including hydration, and aerobic/resistive exercise prescription along with prescribed medications to achieve blood glucose in normal ranges: Fasting glucose 65-99 mg/dL    Expected Outcomes Short Term: Participant verbalizes understanding of the signs/symptoms and immediate care of hyper/hypoglycemia, proper foot care and importance of medication, aerobic/resistive exercise and nutrition plan for blood glucose control.;Long Term: Attainment of HbA1C < 7%.    Heart Failure Yes    Intervention Provide a combined exercise and nutrition program that is supplemented with education, support and counseling about heart failure. Directed toward relieving symptoms such as  shortness of breath, decreased exercise tolerance, and extremity edema.    Expected Outcomes Improve functional capacity of life;Short term: Attendance in program 2-3 days a week with increased exercise capacity. Reported lower sodium intake. Reported increased fruit and vegetable intake. Reports medication compliance.;Short term: Daily weights obtained and reported for increase. Utilizing diuretic protocols set by physician.;Long term: Adoption of self-care skills and reduction of barriers for early signs and symptoms recognition and intervention leading to self-care maintenance.    Hypertension Yes    Intervention Provide education on lifestyle modifcations including regular physical activity/exercise, weight management, moderate sodium restriction and increased consumption of fresh fruit, vegetables, and low fat dairy, alcohol moderation, and smoking cessation.;Monitor prescription use compliance.    Expected Outcomes Short Term: Continued assessment and intervention until BP is < 140/60mm HG in hypertensive participants. < 130/90mm HG in hypertensive participants with diabetes, heart failure or chronic kidney disease.;Long Term: Maintenance of blood pressure at goal levels.    Lipids Yes    Intervention Provide education and support for participant on nutrition & aerobic/resistive exercise along with prescribed medications to achieve LDL 70mg , HDL >40mg .    Expected Outcomes Short Term: Participant states understanding of desired cholesterol values and is compliant with medications prescribed. Participant is following exercise prescription and nutrition guidelines.;Long Term: Cholesterol controlled with medications as prescribed, with individualized exercise RX and with personalized nutrition plan. Value goals: LDL < 70mg , HDL > 40 mg.             Education:Diabetes - Individual verbal and written instruction to review signs/symptoms of diabetes, desired ranges of glucose level fasting, after  meals and with exercise. Acknowledge that pre and post exercise glucose checks will be done for 3 sessions at entry of program. Flowsheet Row Pulmonary Rehab from 03/12/2023 in [facility] Cardiac and Pulmonary Rehab  Date 03/05/23  Educator [facility]  Instruction Review Code 1- Verbalizes Understanding       Know Your Numbers and Heart Failure: - Group verbal and visual instruction to discuss disease risk factors for cardiac and pulmonary disease and treatment options.  Reviews associated critical values for Overweight/Obesity, Hypertension, Cholesterol, and Diabetes.  Discusses basics of heart failure: signs/symptoms and treatments.  Introduces Heart Failure Zone chart for action plan for heart failure.  Written material given at graduation. Flowsheet Row Pulmonary Rehab from 03/12/2023 in [facility] Cardiac and Pulmonary Rehab  Education need identified 03/12/23       Core Components/Risk Factors/Patient Goals Review:    Core  Components/Risk Factors/Patient Goals at Discharge (Final Review):    ITP Comments:  ITP Comments     Row Name 03/05/23 0914 03/12/23 1027 03/18/23 1235       ITP Comments Virtual Visit completed. Patient informed on EP and RD appointment and 6 Minute walk test. Patient also informed of patient health questionnaires on My Chart. Patient Verbalizes understanding. Visit diagnosis can be found in [facility] 02/03/[facility]. Completed and gym orientation. Initial ITP created and sent for review to Dr. Jinny Sanders, Medical Director. 30 Day review completed. Medical Director ITP review done, changes made as directed, and signed approval by Medical Director.   new to program              Comments:

## 2023-03-19 ENCOUNTER — Encounter: Payer: Medicare HMO | Admitting: *Deleted

## 2023-03-24 ENCOUNTER — Encounter: Payer: Medicare HMO | Admitting: *Deleted

## 2023-03-24 DIAGNOSIS — Z5189 Encounter for other specified aftercare: Secondary | ICD-10-CM | POA: Diagnosis not present

## 2023-03-24 DIAGNOSIS — I5022 Chronic systolic (congestive) heart failure: Secondary | ICD-10-CM | POA: Diagnosis not present

## 2023-03-24 LAB — GLUCOSE, CAPILLARY
Glucose-Capillary: 139 mg/dL — ABNORMAL HIGH (ref 70–99)
Glucose-Capillary: 138 mg/dL — ABNORMAL HIGH (ref 70–99)

## 2023-03-24 NOTE — Progress Notes (Signed)
Daily Session Note  Patient Details  Name: UCHENNA SEUFERT MRN: [id number] Date of Birth: Feb 07, 1946 Referring Provider:   Doristine Devoid Pulmonary Rehab from 03/12/2023 in [facility] Cardiac and Pulmonary Rehab  Referring Provider Dr. Dorothyann Peng, MD       Encounter Date: 03/24/2023  Check In:  Session Check In - 03/24/23 0955       Check-In   Supervising physician immediately available to respond to emergencies See telemetry face sheet for immediately available ER MD    Location [facility]-Cardiac & Pulmonary Rehab    Staff Present Cora Collum, RN, BSN, CCRP;Margaret Best, MS, Exercise Physiologist;Noah Tickle, BS, Exercise Physiologist;Maxon Conetta BS, , Exercise Physiologist    Virtual Visit No    Medication changes reported     No    Warm-up and Cool-down Performed on first and last piece of equipment    Resistance Training Performed Yes    VAD Patient? No    PAD/SET Patient? No      Pain Assessment   Currently in Pain? No/denies                Social History   Tobacco Use  Smoking Status Never  Smokeless Tobacco Never    Goals Met:  Proper associated with RPD/PD & O2 Sat Exercise tolerated well Personal goals reviewed No report of concerns or symptoms today  Goals Unmet:  Not Applicable  Comments: First full day of exercise!  Patient was oriented to gym and equipment including functions, settings, policies, and procedures.  Patient's individual exercise prescription and treatment plan were reviewed.  All starting workloads were established based on the results of the 6 minute walk test done at initial orientation visit.  The plan for exercise progression was also introduced and progression will be customized based on patient's performance and goals.     Dr. Bethann Punches is Medical Director for [facility] Cardiac Rehabilitation.  Dr. Vida Rigger is Medical Director for [facility] Pulmonary Rehabilitation.

## 2023-03-26 ENCOUNTER — Encounter: Payer: Medicare HMO | Admitting: *Deleted

## 2023-03-26 DIAGNOSIS — Z5189 Encounter for other specified aftercare: Secondary | ICD-10-CM | POA: Diagnosis not present

## 2023-03-26 DIAGNOSIS — I5022 Chronic systolic (congestive) heart failure: Secondary | ICD-10-CM | POA: Diagnosis not present

## 2023-03-26 NOTE — Progress Notes (Signed)
Daily Session Note  Patient Details  Name: Mckenzie Wright MRN: [id number] Date of Birth: 09-19-1945 Referring Provider:   Doristine Devoid Pulmonary Rehab from 03/12/2023 in [facility] Cardiac and Pulmonary Rehab  Referring Provider Dr. Dorothyann Peng, MD       Encounter Date: 03/26/2023  Check In:  Session Check In - 03/26/23 0929       Check-In   Supervising physician immediately available to respond to emergencies See telemetry face sheet for immediately available ER MD    Location [facility]-Cardiac & Pulmonary Rehab    Staff Present Ronette Deter, BS, Exercise Physiologist;Joseph [place], RCP,RRT,BSRT;Maxon Brier BS, , Exercise Physiologist;Mohab Ashby Katrinka Blazing, RN, [place]    Virtual Visit No    Medication changes reported     No    Fall or balance concerns reported    No    Warm-up and Cool-down Performed on first and last piece of equipment    Resistance Training Performed Yes    VAD Patient? No    PAD/SET Patient? No      Pain Assessment   Currently in Pain? No/denies                Social History   Tobacco Use  Smoking Status Never  Smokeless Tobacco Never    Goals Met:  Independence with exercise equipment Exercise tolerated well No report of concerns or symptoms today Strength training completed today  Goals Unmet:  Not Applicable  Comments: Pt able to follow exercise prescription today without complaint.  Will continue to monitor for progression.    Dr. Bethann Punches is Medical Director for [facility] Cardiac Rehabilitation.  Dr. Vida Rigger is Medical Director for [facility] Pulmonary Rehabilitation.

## 2023-03-30 DIAGNOSIS — H524 Presbyopia: Secondary | ICD-10-CM | POA: Diagnosis not present

## 2023-03-31 ENCOUNTER — Encounter: Payer: Medicare HMO | Admitting: *Deleted

## 2023-03-31 DIAGNOSIS — I5022 Chronic systolic (congestive) heart failure: Secondary | ICD-10-CM | POA: Diagnosis not present

## 2023-03-31 DIAGNOSIS — Z5189 Encounter for other specified aftercare: Secondary | ICD-10-CM | POA: Diagnosis not present

## 2023-03-31 NOTE — Progress Notes (Signed)
Daily Session Note  Patient Details  Name: Mckenzie Wright MRN: [id number] Date of Birth: [birth year] Referring Provider:   Doristine Devoid Pulmonary Rehab from 03/12/2023 in [facility] Cardiac and Pulmonary Rehab  Referring Provider Dr. Dorothyann Peng, MD       Encounter Date: 03/31/2023  Check In:  Session Check In - 03/31/23 0948       Check-In   Supervising physician immediately available to respond to emergencies See telemetry face sheet for immediately available ER MD    Location [facility]-Cardiac & Pulmonary Rehab    Staff Present Rory Percy, MS, Exercise Physiologist;Truda Staub, RN, BSN, CCRP;Noah Tickle, BS, Exercise Physiologist;Maxon Conetta BS, , Exercise Physiologist    Virtual Visit No    Medication changes reported     No    Fall or balance concerns reported    No    Warm-up and Cool-down Performed on first and last piece of equipment    Resistance Training Performed Yes    VAD Patient? No    PAD/SET Patient? No      Pain Assessment   Currently in Pain? No/denies                Social History   Tobacco Use  Smoking Status Never  Smokeless Tobacco Never    Goals Met:  Proper associated with RPD/PD & O2 Sat Independence with exercise equipment Exercise tolerated well No report of concerns or symptoms today  Goals Unmet:  Not Applicable  Comments: Pt able to follow exercise prescription today without complaint.  Will continue to monitor for progression.    Dr. Bethann Punches is Medical Director for [facility] Cardiac Rehabilitation.  Dr. Vida Rigger is Medical Director for [facility] Pulmonary Rehabilitation.

## 2023-04-02 ENCOUNTER — Encounter: Payer: Medicare HMO | Admitting: *Deleted

## 2023-04-02 DIAGNOSIS — I5022 Chronic systolic (congestive) heart failure: Secondary | ICD-10-CM

## 2023-04-02 DIAGNOSIS — Z5189 Encounter for other specified aftercare: Secondary | ICD-10-CM | POA: Diagnosis not present

## 2023-04-02 NOTE — Progress Notes (Signed)
Daily Session Note  Patient Details  Name: Mckenzie Wright MRN: [id number] Date of Birth: 1945-11-29 Referring Provider:   Doristine Devoid Pulmonary Rehab from 03/12/2023 in [facility] Cardiac and Pulmonary Rehab  Referring Provider Dr. Dorothyann Peng, MD       Encounter Date: 04/02/2023  Check In:  Session Check In - 04/02/23 0929       Check-In   Supervising physician immediately available to respond to emergencies See telemetry face sheet for immediately available ER MD    Location [facility]-Cardiac & Pulmonary Rehab    Staff Present Cora Collum, RN, BSN, CCRP;Joseph Hood, RCP,RRT,BSRT;Maxon Bieber BS, , Exercise Physiologist;Ardian Haberland Katrinka Blazing, RN, [place]    Virtual Visit No    Medication changes reported     No    Fall or balance concerns reported    No    Warm-up and Cool-down Performed on first and last piece of equipment    Resistance Training Performed Yes    VAD Patient? No    PAD/SET Patient? No      Pain Assessment   Currently in Pain? No/denies                Social History   Tobacco Use  Smoking Status Never  Smokeless Tobacco Never    Goals Met:  Independence with exercise equipment Exercise tolerated well No report of concerns or symptoms today Strength training completed today  Goals Unmet:  Not Applicable  Comments: Pt able to follow exercise prescription today without complaint.  Will continue to monitor for progression.    Dr. Bethann Punches is Medical Director for [facility] Cardiac Rehabilitation.  Dr. Vida Rigger is Medical Director for [facility] Pulmonary Rehabilitation.

## 2023-04-07 ENCOUNTER — Encounter: Payer: Medicare HMO | Admitting: *Deleted

## 2023-04-07 DIAGNOSIS — I5022 Chronic systolic (congestive) heart failure: Secondary | ICD-10-CM

## 2023-04-07 DIAGNOSIS — Z5189 Encounter for other specified aftercare: Secondary | ICD-10-CM | POA: Diagnosis not present

## 2023-04-07 NOTE — Progress Notes (Signed)
Daily Session Note  Patient Details  Name: Mckenzie Wright MRN: [id number] Date of Birth: 1946/03/30 Referring Provider:   Doristine Devoid Pulmonary Rehab from 03/12/2023 in [facility] Cardiac and Pulmonary Rehab  Referring Provider Dr. Dorothyann Peng, MD       Encounter Date: 04/07/2023  Check In:  Session Check In - 04/07/23 0938       Check-In   Supervising physician immediately available to respond to emergencies See telemetry face sheet for immediately available ER MD    Location [facility]-Cardiac & Pulmonary Rehab    Staff Present Cora Collum, RN, BSN, CCRP;Noah Tickle, BS, Exercise Physiologist;Margaret Best, MS, Exercise Physiologist;Maxon Conetta BS, , Exercise Physiologist    Virtual Visit No    Medication changes reported     No    Fall or balance concerns reported    No    Warm-up and Cool-down Performed on first and last piece of equipment    Resistance Training Performed Yes    VAD Patient? No    PAD/SET Patient? No      Pain Assessment   Currently in Pain? No/denies                Social History   Tobacco Use  Smoking Status Never  Smokeless Tobacco Never    Goals Met:  Proper associated with RPD/PD & O2 Sat Independence with exercise equipment Exercise tolerated well No report of concerns or symptoms today  Goals Unmet:  Not Applicable  Comments: Pt able to follow exercise prescription today without complaint.  Will continue to monitor for progression.    Dr. Bethann Punches is Medical Director for [facility] Cardiac Rehabilitation.  Dr. Vida Rigger is Medical Director for [facility] Pulmonary Rehabilitation.

## 2023-04-08 DIAGNOSIS — R609 Edema, unspecified: Secondary | ICD-10-CM | POA: Diagnosis not present

## 2023-04-08 DIAGNOSIS — E1122 Type 2 diabetes mellitus with diabetic chronic kidney disease: Secondary | ICD-10-CM | POA: Diagnosis not present

## 2023-04-08 DIAGNOSIS — N184 Chronic kidney disease, stage 4 (severe): Secondary | ICD-10-CM | POA: Diagnosis not present

## 2023-04-08 DIAGNOSIS — I4821 Permanent atrial fibrillation: Secondary | ICD-10-CM | POA: Diagnosis not present

## 2023-04-08 DIAGNOSIS — I10 Essential (primary) hypertension: Secondary | ICD-10-CM | POA: Diagnosis not present

## 2023-04-08 DIAGNOSIS — N2581 Secondary hyperparathyroidism of renal origin: Secondary | ICD-10-CM | POA: Diagnosis not present

## 2023-04-08 DIAGNOSIS — I509 Heart failure, unspecified: Secondary | ICD-10-CM | POA: Diagnosis not present

## 2023-04-08 DIAGNOSIS — E785 Hyperlipidemia, unspecified: Secondary | ICD-10-CM | POA: Diagnosis not present

## 2023-04-08 DIAGNOSIS — E875 Hyperkalemia: Secondary | ICD-10-CM | POA: Diagnosis not present

## 2023-04-09 ENCOUNTER — Encounter: Payer: Medicare HMO | Admitting: *Deleted

## 2023-04-09 DIAGNOSIS — I5022 Chronic systolic (congestive) heart failure: Secondary | ICD-10-CM | POA: Diagnosis not present

## 2023-04-09 DIAGNOSIS — Z5189 Encounter for other specified aftercare: Secondary | ICD-10-CM | POA: Diagnosis not present

## 2023-04-09 NOTE — Progress Notes (Signed)
Daily Session Note  Patient Details  Name: Mckenzie Wright MRN: [id number] Date of Birth: 1946/03/23 Referring Provider:   Doristine Devoid Pulmonary Rehab from 03/12/2023 in [facility] Cardiac and Pulmonary Rehab  Referring Provider Dr. Dorothyann Peng, MD       Encounter Date: 04/09/2023  Check In:  Session Check In - 04/09/23 0936       Check-In   Supervising physician immediately available to respond to emergencies See telemetry face sheet for immediately available ER MD    Location [facility]-Cardiac & Pulmonary Rehab    Staff Present Cora Collum, RN, BSN, CCRP;Joseph Hood, RCP,RRT,BSRT;Maxon [place] BS, , Exercise Physiologist;Mosella Kasa Katrinka Blazing, RN, [place]    Virtual Visit No    Medication changes reported     No    Fall or balance concerns reported    No    Warm-up and Cool-down Performed on first and last piece of equipment    Resistance Training Performed Yes    VAD Patient? No    PAD/SET Patient? No      Pain Assessment   Currently in Pain? No/denies                Social History   Tobacco Use  Smoking Status Never  Smokeless Tobacco Never    Goals Met:  Independence with exercise equipment Exercise tolerated well No report of concerns or symptoms today Strength training completed today  Goals Unmet:  Not Applicable  Comments: Pt able to follow exercise prescription today without complaint.  Will continue to monitor for progression.    Dr. Bethann Punches is Medical Director for [facility] Cardiac Rehabilitation.  Dr. Vida Rigger is Medical Director for [facility] Pulmonary Rehabilitation.

## 2023-04-14 ENCOUNTER — Encounter: Payer: Medicare HMO | Admitting: *Deleted

## 2023-04-14 DIAGNOSIS — I5022 Chronic systolic (congestive) heart failure: Secondary | ICD-10-CM

## 2023-04-14 DIAGNOSIS — Z5189 Encounter for other specified aftercare: Secondary | ICD-10-CM | POA: Diagnosis not present

## 2023-04-14 NOTE — Progress Notes (Signed)
Daily Session Note  Patient Details  Name: Mckenzie Wright MRN: [id number] Date of Birth: 1946-02-10 Referring Provider:   Doristine Devoid Pulmonary Rehab from 03/12/2023 in [facility] Cardiac and Pulmonary Rehab  Referring Provider Dr. Dorothyann Peng, MD       Encounter Date: 04/14/2023  Check In:  Session Check In - 04/14/23 0953       Check-In   Supervising physician immediately available to respond to emergencies See telemetry face sheet for immediately available ER MD    Location [facility]-Cardiac & Pulmonary Rehab    Staff Present Cora Collum, RN, BSN, CCRP;Noah Tickle, BS, Exercise Physiologist;Margaret Best, MS, Exercise Physiologist;Maxon Conetta BS, , Exercise Physiologist    Virtual Visit No    Medication changes reported     No    Fall or balance concerns reported    No    Warm-up and Cool-down Performed on first and last piece of equipment    Resistance Training Performed Yes    VAD Patient? No    PAD/SET Patient? No      Pain Assessment   Currently in Pain? No/denies                Social History   Tobacco Use  Smoking Status Never  Smokeless Tobacco Never    Goals Met:  Proper associated with RPD/PD & O2 Sat Independence with exercise equipment Exercise tolerated well No report of concerns or symptoms today  Goals Unmet:  Not Applicable  Comments: Pt able to follow exercise prescription today without complaint.  Will continue to monitor for progression.    Dr. Bethann Punches is Medical Director for [facility] Cardiac Rehabilitation.  Dr. Vida Rigger is Medical Director for [facility] Pulmonary Rehabilitation.

## 2023-04-15 ENCOUNTER — Encounter: Payer: Self-pay | Admitting: *Deleted

## 2023-04-15 DIAGNOSIS — I509 Heart failure, unspecified: Secondary | ICD-10-CM | POA: Diagnosis not present

## 2023-04-15 DIAGNOSIS — N184 Chronic kidney disease, stage 4 (severe): Secondary | ICD-10-CM | POA: Diagnosis not present

## 2023-04-15 DIAGNOSIS — E875 Hyperkalemia: Secondary | ICD-10-CM | POA: Diagnosis not present

## 2023-04-15 DIAGNOSIS — I10 Essential (primary) hypertension: Secondary | ICD-10-CM | POA: Diagnosis not present

## 2023-04-15 DIAGNOSIS — I5022 Chronic systolic (congestive) heart failure: Secondary | ICD-10-CM

## 2023-04-15 DIAGNOSIS — E785 Hyperlipidemia, unspecified: Secondary | ICD-10-CM | POA: Diagnosis not present

## 2023-04-15 DIAGNOSIS — Z951 Presence of aortocoronary bypass graft: Secondary | ICD-10-CM | POA: Diagnosis not present

## 2023-04-15 DIAGNOSIS — E1122 Type 2 diabetes mellitus with diabetic chronic kidney disease: Secondary | ICD-10-CM | POA: Diagnosis not present

## 2023-04-15 DIAGNOSIS — D631 Anemia in chronic kidney disease: Secondary | ICD-10-CM | POA: Diagnosis not present

## 2023-04-15 DIAGNOSIS — I4821 Permanent atrial fibrillation: Secondary | ICD-10-CM | POA: Diagnosis not present

## 2023-04-15 NOTE — Progress Notes (Signed)
Pulmonary Individual Treatment Plan  Patient Details  Name: Mckenzie Wright MRN: [id number] Date of Birth: 10/20/1945 Referring Provider:   Doristine Devoid Pulmonary Rehab from 03/12/2023 in [facility] Cardiac and Pulmonary Rehab  Referring Provider Dr. Dorothyann Peng, MD       Initial Encounter Date:  Flowsheet Row Pulmonary Rehab from 03/12/2023 in [facility] Cardiac and Pulmonary Rehab  Date 03/12/23       Visit Diagnosis: Heart failure, chronic systolic (HCC)  Patient's Home Medications on Admission:  Current Outpatient Medications:    aspirin EC 81 MG tablet, Take 81 mg by mouth daily., Disp: , Rfl:    Calcium Carbonate-Vitamin D 600-200 MG-UNIT TABS, Take by mouth., Disp: , Rfl:    heparin 40981 UT/250ML infusion, Inject 1,200 Units/hr into the vein continuous. (Patient not taking: Reported on 03/05/2023), Disp: , Rfl:    insulin NPH-regular Human (70-30) 100 UNIT/ML injection, Inject 50 Units into the skin daily with breakfast., Disp: , Rfl:    insulin NPH-regular Human (70-30) 100 UNIT/ML injection, Inject 25 Units into the skin daily with supper. (Patient not taking: Reported on 03/05/2023), Disp: , Rfl:    levothyroxine (SYNTHROID) 88 MCG tablet, Take by mouth., Disp: , Rfl:    metoprolol tartrate (LOPRESSOR) 25 MG tablet, Take 0.5 tablets (12.5 mg total) by mouth 2 (two) times daily., Disp: , Rfl:    rosuvastatin (CRESTOR) 20 MG tablet, Take 1 tablet (20 mg total) by mouth daily at 6 (six) AM., Disp: , Rfl:    torsemide (DEMADEX) 20 MG tablet, TAKE 1 TABLET BY MOUTH ONCE DAILY IN THE MORNING AS NEEDED FOR EDEMA IN LEGS, Disp: , Rfl:    vitamin B-12 (CYANOCOBALAMIN) 1000 MCG tablet, Take 1,000 mcg by mouth daily., Disp: , Rfl:   Past Medical History: Past Medical History:  Diagnosis Date   Asthma    Diabetes (HCC)    Hypertension    Stroke (HCC)     Tobacco Use: Social History   Tobacco Use  Smoking Status Never  Smokeless Tobacco Never    Labs: Review Flowsheet        Latest Ref Rng & Units 03/16/2021 03/17/2021  Labs for ITP Cardiac and Pulmonary Rehab  Cholestrol 0 - 200 mg/dL - 191   LDL (calc) 0 - 99 mg/dL - 64   HDL-C >47 mg/dL - 41   Trlycerides <829 mg/dL - 562   Hemoglobin Z3Y 4.8 - 5.6 % 9.2  -  Bicarbonate 20.0 - 28.0 mmol/L 30.2  -  O2 Saturation % 43.2  -    Details             Pulmonary Assessment Scores:  Pulmonary Assessment Scores     Row Name 03/12/23 1048         ADL UCSD   ADL Phase Entry     SOB Score total 32     Rest 0     Walk 2     Stairs 2     Bath 0     Dress 0     Shop 2       CAT Score   CAT Score 6       mMRC Score   mMRC Score 1              UCSD: Self-administered rating of dyspnea associated with activities of daily living (ADLs) 6-point scale (0 = "not at all" to 5 = "maximal or unable to do because of breathlessness")  Scoring Scores range from  0 to 120.  Minimally important difference is 5 units  CAT: CAT can identify the health impairment of COPD patients and is better correlated with disease progression.  CAT has a scoring range of zero to 40. The CAT score is classified into four groups of low (less than 10), medium (10 - 20), high (21-30) and very high (31-40) based on the impact level of disease on health status. A CAT score over 10 suggests significant symptoms.  A worsening CAT score could be explained by an exacerbation, poor medication adherence, poor inhaler technique, or progression of COPD or comorbid conditions.  CAT MCID is 2 points  mMRC: mMRC (Modified Medical Research Council) Dyspnea Scale is used to assess the degree of baseline functional disability in patients of respiratory disease due to dyspnea. No minimal important difference is established. A decrease in score of 1 point or greater is considered a positive change.   Pulmonary Function Assessment:  Pulmonary Function Assessment - 03/05/23 0912       Breath   Shortness of Breath No             Exercise  Target Goals: Exercise Program Goal: Individual exercise prescription set using results from initial 6 min walk test and THRR while considering  patient's activity barriers and safety.   Exercise Prescription Goal: Initial exercise prescription builds to 30-45 minutes a day of aerobic activity, 2-3 days per week.  Home exercise guidelines will be given to patient during program as part of exercise prescription that the participant will acknowledge.  Education: Aerobic Exercise: - Group verbal and visual presentation on the components of exercise prescription. Introduces F.I.T.T principle from ACSM for exercise prescriptions.  Reviews F.I.T.T. principles of aerobic exercise including progression. Written material given at graduation. Flowsheet Row Pulmonary Rehab from 03/12/2023 in [facility] Cardiac and Pulmonary Rehab  Education need identified 03/12/23       Education: Resistance Exercise: - Group verbal and visual presentation on the components of exercise prescription. Introduces F.I.T.T principle from ACSM for exercise prescriptions  Reviews F.I.T.T. principles of resistance exercise including progression. Written material given at graduation.    Education: Exercise & Equipment Safety: - Individual verbal instruction and demonstration of equipment use and safety with use of the equipment. Flowsheet Row Pulmonary Rehab from 03/12/2023 in [facility] Cardiac and Pulmonary Rehab  Date 03/05/23  Educator [facility]  Instruction Review Code 1- Verbalizes Understanding       Education: Exercise Physiology & General Exercise Guidelines: - Group verbal and written instruction with models to review the exercise physiology of the cardiovascular system and associated critical values. Provides general exercise guidelines with specific guidelines to those with heart or lung disease.    Education: Flexibility, Balance, Mind/Body Relaxation: - Group verbal and visual presentation with interactive activity on the  components of exercise prescription. Introduces F.I.T.T principle from ACSM for exercise prescriptions. Reviews F.I.T.T. principles of flexibility and balance exercise training including progression. Also discusses the mind body connection.  Reviews various relaxation techniques to help reduce and manage stress (i.e. Deep breathing, progressive muscle relaxation, and visualization). Balance handout provided to take home. Written material given at graduation.   Activity Barriers & Risk Stratification:  Activity Barriers & Cardiac Risk Stratification - 03/12/23 1034       Activity Barriers & Cardiac Risk Stratification   Activity Barriers Deconditioning;Muscular Weakness;Balance Concerns;Other (comment)    Comments Hx of hip surgery    Cardiac Risk Stratification High  6 Minute Walk:  6 Minute Walk     Row Name 03/12/23 1029         6 Minute Walk   Phase Initial     Distance 755 feet     Walk Time 5.67 minutes     # of Rest Breaks 1     MPH 1.51     METS 1.31     RPE 12     Perceived Dyspnea  0     VO2 Peak 4.59     Symptoms No     Resting HR 69 bpm     Resting BP 112/54     Resting Oxygen Saturation  94 %     Exercise Oxygen Saturation  during 6 min walk 91 %     Max Ex. HR 87 bpm     Max Ex. BP 126/50     2 Minute Post BP 110/56       Interval HR   1 Minute HR 81     2 Minute HR 82     3 Minute HR 86     4 Minute HR 85     5 Minute HR 85     6 Minute HR 87     2 Minute Post HR 69     Interval Heart Rate? Yes       Interval Oxygen   Interval Oxygen? Yes     Baseline Oxygen Saturation % 94 %     1 Minute Oxygen Saturation % 92 %     1 Minute Liters of Oxygen 0 L  RA     2 Minute Oxygen Saturation % 92 %     2 Minute Liters of Oxygen 0 L     3 Minute Oxygen Saturation % 94 %     3 Minute Liters of Oxygen 0 L     4 Minute Oxygen Saturation % 97 %     4 Minute Liters of Oxygen 0 L     5 Minute Oxygen Saturation % 91 %     5 Minute Liters of  Oxygen 0 L     6 Minute Oxygen Saturation % 93 %     6 Minute Liters of Oxygen 0 L     2 Minute Post Oxygen Saturation % 95 %     2 Minute Post Liters of Oxygen 0 L             Oxygen Initial Assessment:  Oxygen Initial Assessment - 03/05/23 0912       Home Oxygen   Home Oxygen Device None    Sleep Oxygen Prescription None    Home Exercise Oxygen Prescription None    Home Resting Oxygen Prescription None      Initial 6 min Walk   Oxygen Used None      Program Oxygen Prescription   Program Oxygen Prescription None      Intervention   Short Term Goals To learn and exhibit compliance with exercise, home and travel O2 prescription;To learn and understand importance of monitoring SPO2 with pulse oximeter and demonstrate accurate use of the pulse oximeter.;To learn and understand importance of maintaining oxygen saturations>88%;To learn and demonstrate proper pursed lip breathing techniques or other breathing techniques.     Long  Term Goals Exhibits compliance with exercise, home  and travel O2 prescription;Verbalizes importance of monitoring SPO2 with pulse oximeter and return demonstration;Maintenance of O2 saturations>88%;Exhibits proper breathing techniques, such as pursed lip breathing or other  method taught during program session;Compliance with respiratory medication             Oxygen Re-Evaluation:  Oxygen Re-Evaluation     Row Name 03/24/23 0956 04/02/23 0951           Program Oxygen Prescription   Program Oxygen Prescription None None        Home Oxygen   Home Oxygen Device None None      Sleep Oxygen Prescription None None      Home Exercise Oxygen Prescription None None      Home Resting Oxygen Prescription None None      Compliance with Home Oxygen Use Yes --        Goals/Expected Outcomes   Short Term Goals To learn and demonstrate proper pursed lip breathing techniques or other breathing techniques.  To learn and demonstrate proper pursed lip  breathing techniques or other breathing techniques.       Long  Term Goals Exhibits proper breathing techniques, such as pursed lip breathing or other method taught during program session Exhibits proper breathing techniques, such as pursed lip breathing or other method taught during program session      Comments Reviewed PLB technique with pt.  Talked about how it works and it's importance in maintaining their exercise saturations. Informed patient how to perform the Pursed Lipped breathing technique. Told patient to Inhale through the nose and out the mouth with pursed lips to keep their airways open, help oxygenate them better, practice when at rest or doing strenuous activity. Patient Verbalizes understanding of technique and will work on and be reiterated during [facility].      Goals/Expected Outcomes Short: Become more profiecient at using PLB. Long: Become independent at using PLB. Short: use PLB with exertion. Long: use PLB on exertion proficiently and independently.               Oxygen Discharge (Final Oxygen Re-Evaluation):  Oxygen Re-Evaluation - 04/02/23 0951       Program Oxygen Prescription   Program Oxygen Prescription None      Home Oxygen   Home Oxygen Device None    Sleep Oxygen Prescription None    Home Exercise Oxygen Prescription None    Home Resting Oxygen Prescription None      Goals/Expected Outcomes   Short Term Goals To learn and demonstrate proper pursed lip breathing techniques or other breathing techniques.     Long  Term Goals Exhibits proper breathing techniques, such as pursed lip breathing or other method taught during program session    Comments Informed patient how to perform the Pursed Lipped breathing technique. Told patient to Inhale through the nose and out the mouth with pursed lips to keep their airways open, help oxygenate them better, practice when at rest or doing strenuous activity. Patient Verbalizes understanding of technique and will work on  and be reiterated during [facility].    Goals/Expected Outcomes Short: use PLB with exertion. Long: use PLB on exertion proficiently and independently.             Initial Exercise Prescription:  Initial Exercise Prescription - 03/12/23 1000       Date of Initial Exercise RX and Referring Provider   Date 03/12/23    Referring Provider Dr. Dorothyann Peng, MD      Oxygen   Maintain Oxygen Saturation 88% or higher      Recumbant Bike   Level 1    RPM 50    Watts 15  Minutes 15    METs 1.31      NuStep   Level 1    SPM 80    Minutes 15    METs 1.31      Biostep-RELP   Level 1    SPM 50    Minutes 15    METs 1.31      Track   Laps 12    Minutes 15    METs 1.65      Prescription Details   Frequency (times per week) 2    Duration Progress to 30 minutes of continuous aerobic without signs/symptoms of physical distress      Intensity   THRR 40-80% of Max Heartrate 97-126    Ratings of Perceived Exertion 11-13    Perceived Dyspnea 0-4      Progression   Progression Continue to progress workloads to maintain intensity without signs/symptoms of physical distress.      Resistance Training   Training Prescription Yes    Weight 2 lb    Reps 10-15             Perform Capillary Blood Glucose checks as needed.  Exercise Prescription Changes:   Exercise Prescription Changes     Row Name 03/12/23 1000 04/02/23 1600           Response to Exercise   Blood Pressure (Admit) 112/54 110/58      Blood Pressure (Exercise) 126/50 128/64      Blood Pressure (Exit) 110/56 106/58      Heart Rate (Admit) 69 bpm 76 bpm      Heart Rate (Exercise) 87 bpm 105 bpm      Heart Rate (Exit) 69 bpm 81 bpm      Oxygen Saturation (Admit) 94 % 94 %      Oxygen Saturation (Exercise) 91 % 89 %      Oxygen Saturation (Exit) 95 % 96 %      Rating of Perceived Exertion (Exercise) 12 15      Perceived Dyspnea (Exercise) 0 0      Symptoms none none      Comments Results  First two days of exercise      Duration -- Progress to 30 minutes of  aerobic without signs/symptoms of physical distress      Intensity -- THRR unchanged        Progression   Progression -- Continue to progress workloads to maintain intensity without signs/symptoms of physical distress.      Average METs -- 1.83        Resistance Training   Training Prescription -- Yes      Weight -- 2 lb      Reps -- 10-15        Interval Training   Interval Training -- No        NuStep   Level -- 2      Minutes -- 15      METs -- 2.5        Track   Laps -- 10      Minutes -- 15      METs -- 1.54        Oxygen   Maintain Oxygen Saturation -- 88% or higher               Exercise Comments:   Exercise Comments     Row Name 03/24/23 0955           Exercise Comments First full day of  exercise!  Patient was oriented to gym and equipment including functions, settings, policies, and procedures.  Patient's individual exercise prescription and treatment plan were reviewed.  All starting workloads were established based on the results of the 6 minute walk test done at initial orientation visit.  The plan for exercise progression was also introduced and progression will be customized based on patient's performance and goals.                Exercise Goals and Review:   Exercise Goals     Row Name 03/12/23 1035             Exercise Goals   Increase Physical Activity Yes       Intervention Provide advice, education, support and counseling about physical activity/exercise needs.;Develop an individualized exercise prescription for aerobic and resistive training based on initial evaluation findings, risk stratification, comorbidities and participant's personal goals.       Expected Outcomes Long Term: Add in home exercise to make exercise part of routine and to increase amount of physical activity.;Short Term: Attend rehab on a regular basis to increase amount of physical  activity.;Long Term: Exercising regularly at least 3-5 days a week.       Increase Strength and Stamina Yes       Intervention Provide advice, education, support and counseling about physical activity/exercise needs.;Develop an individualized exercise prescription for aerobic and resistive training based on initial evaluation findings, risk stratification, comorbidities and participant's personal goals.       Expected Outcomes Short Term: Increase workloads from initial exercise prescription for resistance, speed, and METs.;Short Term: Perform resistance training exercises routinely during rehab and add in resistance training at home;Long Term: Improve cardiorespiratory fitness, muscular endurance and strength as measured by increased METs and functional capacity ( )       Able to understand and use rate of perceived exertion (RPE) scale Yes       Intervention Provide education and explanation on how to use RPE scale       Expected Outcomes Short Term: Able to use RPE daily in rehab to express subjective intensity level;Long Term:  Able to use RPE to guide intensity level when exercising independently       Able to understand and use Dyspnea scale Yes       Intervention Provide education and explanation on how to use Dyspnea scale       Expected Outcomes Long Term: Able to use Dyspnea scale to guide intensity level when exercising independently;Short Term: Able to use Dyspnea scale daily in rehab to express subjective sense of shortness of breath during exertion       Knowledge and understanding of Target Heart Rate Range (THRR) Yes       Intervention Provide education and explanation of THRR including how the numbers were predicted and where they are located for reference       Expected Outcomes Short Term: Able to state/look up THRR;Long Term: Able to use THRR to govern intensity when exercising independently;Short Term: Able to use daily as guideline for intensity in rehab       Able to check  pulse independently Yes       Intervention Provide education and demonstration on how to check pulse in carotid and radial arteries.;Review the importance of being able to check your own pulse for safety during independent exercise       Expected Outcomes Short Term: Able to explain why pulse checking is important during independent exercise;Long Term: Able  to check pulse independently and accurately       Understanding of Exercise Prescription Yes       Intervention Provide education, explanation, and written materials on patient's individual exercise prescription       Expected Outcomes Short Term: Able to explain program exercise prescription;Long Term: Able to explain home exercise prescription to exercise independently                Exercise Goals Re-Evaluation :  Exercise Goals Re-Evaluation     Row Name 03/24/23 0954 04/02/23 1620           Exercise Goal Re-Evaluation   Exercise Goals Review Knowledge and understanding of Target Heart Rate Range (THRR);Able to understand and use rate of perceived exertion (RPE) scale;Able to understand and use Dyspnea scale;Understanding of Exercise Prescription Increase Physical Activity;Increase Strength and Stamina;Understanding of Exercise Prescription      Comments Reviewed RPE and dyspnea scale, THR and program prescription with pt today.  Pt voiced understanding and was given a copy of goals to take home. Mckenzie Wright is off to a good start in the program. She did well walking the track and walked up to 10 laps. She also did well on the T4 nustep and improved to level 2. She also did well with 2 lb hand weights for resistance training. We will continue to monitor her progress in the program.      Expected Outcomes Short: Use RPE daily to regulate intensity. Long: Follow program prescription in THR. Short: Continue to follow current exercise prescription. Long: Continue exercise to improve strength and stamina.               Discharge Exercise  Prescription (Final Exercise Prescription Changes):  Exercise Prescription Changes - 04/02/23 1600       Response to Exercise   Blood Pressure (Admit) 110/58    Blood Pressure (Exercise) 128/64    Blood Pressure (Exit) 106/58    Heart Rate (Admit) 76 bpm    Heart Rate (Exercise) 105 bpm    Heart Rate (Exit) 81 bpm    Oxygen Saturation (Admit) 94 %    Oxygen Saturation (Exercise) 89 %    Oxygen Saturation (Exit) 96 %    Rating of Perceived Exertion (Exercise) 15    Perceived Dyspnea (Exercise) 0    Symptoms none    Comments First two days of exercise    Duration Progress to 30 minutes of  aerobic without signs/symptoms of physical distress    Intensity THRR unchanged      Progression   Progression Continue to progress workloads to maintain intensity without signs/symptoms of physical distress.    Average METs 1.83      Resistance Training   Training Prescription Yes    Weight 2 lb    Reps 10-15      Interval Training   Interval Training No      NuStep   Level 2    Minutes 15    METs 2.5      Track   Laps 10    Minutes 15    METs 1.54      Oxygen   Maintain Oxygen Saturation 88% or higher             Nutrition:  Target Goals: Understanding of nutrition guidelines, daily intake of sodium 1500mg , cholesterol 200mg , calories 30% from fat and 7% or less from saturated fats, daily to have 5 or more servings of fruits and vegetables.  Education: All About  Nutrition: -Group instruction provided by verbal, written material, interactive activities, discussions, models, and posters to present general guidelines for heart healthy nutrition including fat, fiber, MyPlate, the role of sodium in heart healthy nutrition, utilization of the nutrition label, and utilization of this knowledge for meal planning. Follow up email sent as well. Written material given at graduation. Flowsheet Row Pulmonary Rehab from 03/12/2023 in [facility] Cardiac and Pulmonary Rehab  Education need  identified 03/12/23       Biometrics:  Pre Biometrics - 03/12/23 1035       Pre Biometrics   Height 5\' 6"  (1.676 m)    Weight 166 lb 12.8 oz (75.7 kg)    Waist Circumference 35 inches    Hip Circumference 43 inches    Waist to Hip Ratio 0.81 %    BMI (Calculated) 26.94    Single Leg Stand 0 seconds              Nutrition Therapy Plan and Nutrition Goals:   Nutrition Assessments:  MEDIFICTS Score Key: >=70 Need to make dietary changes  40-70 Heart Healthy Diet <= 40 Therapeutic Level Cholesterol Diet  Flowsheet Row Pulmonary Rehab from 03/12/2023 in [facility] Cardiac and Pulmonary Rehab  Picture Your Plate Total Score on Admission 62      Picture Your Plate Scores: <16 Unhealthy dietary pattern with much room for improvement. 41-50 Dietary pattern unlikely to meet recommendations for good health and room for improvement. 51-60 More healthful dietary pattern, with some room for improvement.  >60 Healthy dietary pattern, although there may be some specific behaviors that could be improved.   Nutrition Goals Re-Evaluation:  Nutrition Goals Re-Evaluation     Row Name 04/02/23 [phone]             Goals   Comment Patient was informed on why it is important to maintain a balanced diet when dealing with Respiratory issues. Explained that it takes a lot of energy to breath and when they are short of breath often they will need to have a good diet to help keep up with the calories they are expending for breathing.       Expected Outcome Short: Choose and plan snacks accordingly to patients caloric intake to improve breathing. Long: Maintain a diet independently that meets their caloric intake to aid in daily shortness of breath.                Nutrition Goals Discharge (Final Nutrition Goals Re-Evaluation):  Nutrition Goals Re-Evaluation - 04/02/23 0953       Goals   Comment Patient was informed on why it is important to maintain a balanced diet when dealing with  Respiratory issues. Explained that it takes a lot of energy to breath and when they are short of breath often they will need to have a good diet to help keep up with the calories they are expending for breathing.    Expected Outcome Short: Choose and plan snacks accordingly to patients caloric intake to improve breathing. Long: Maintain a diet independently that meets their caloric intake to aid in daily shortness of breath.             Psychosocial: Target Goals: Acknowledge presence or absence of significant depression and/or stress, maximize coping skills, provide positive support system. Participant is able to verbalize types and ability to use techniques and skills needed for reducing stress and depression.   Education: Stress, Anxiety, and Depression - Group verbal and visual presentation to define topics covered.  Reviews how body is impacted by stress, anxiety, and depression.  Also discusses healthy ways to reduce stress and to treat/manage anxiety and depression.  Written material given at graduation.   Education: Sleep Hygiene -Provides group verbal and written instruction about how sleep can affect your health.  Define sleep hygiene, discuss sleep cycles and impact of sleep habits. Review good sleep hygiene tips.    Initial Review & Psychosocial Screening:  Initial Psych Review & Screening - 03/05/23 0915       Initial Review   Current issues with None Identified      Family Dynamics   Good Support System? Yes    Comments She can look to her falimy for support. She can look to her husband, son and daughter for support.      Barriers   Psychosocial barriers to participate in program The patient should benefit from training in stress management and relaxation.      Screening Interventions   Interventions To provide support and resources with identified psychosocial needs;Provide feedback about the scores to participant;Encouraged to exercise    Expected Outcomes Short  Term goal: Utilizing psychosocial counselor, staff and physician to assist with identification of specific Stressors or current issues interfering with healing process. Setting desired goal for each stressor or current issue identified.;Long Term Goal: Stressors or current issues are controlled or eliminated.;Short Term goal: Identification and review with participant of any Quality of Life or Depression concerns found by scoring the questionnaire.;Long Term goal: The participant improves quality of Life and PHQ9 Scores as seen by post scores and/or verbalization of changes             Quality of Life Scores:  Scores of 19 and below usually indicate a poorer quality of life in these areas.  A difference of  2-3 points is a clinically meaningful difference.  A difference of 2-3 points in the total score of the Quality of Life Index has been associated with significant improvement in overall quality of life, self-image, physical symptoms, and general health in studies assessing change in quality of life.  PHQ-9: Review Flowsheet       03/12/2023  Depression screen PHQ 2/9  Decreased Interest 0  Down, Depressed, Hopeless 0  PHQ - 2 Score 0  Altered sleeping 0  Tired, decreased energy 1  Change in appetite 0  Feeling bad or failure about yourself  0  Trouble concentrating 0  Moving slowly or fidgety/restless 0  Suicidal thoughts 0  PHQ-9 Score 1  Difficult doing work/chores Not difficult at all    Details           Interpretation of Total Score  Total Score Depression Severity:  1-4 = Minimal depression, 5-9 = Mild depression, 10-14 = Moderate depression, 15-19 = Moderately severe depression, 20-27 = Severe depression   Psychosocial Evaluation and Intervention:  Psychosocial Evaluation - 03/05/23 0917       Psychosocial Evaluation & Interventions   Interventions Relaxation education;Stress management education;Encouraged to exercise with the program and follow exercise  prescription    Comments She can look to her falimy for support. She can look to her husband, son and daughter for support.    Expected Outcomes Short: Start [facility] to help with mood. Long: Maintain a healthy mental state.    Continue Psychosocial Services  Follow up required by staff             Psychosocial Re-Evaluation:  Psychosocial Re-Evaluation     Row Name 04/02/23  1027             Psychosocial Re-Evaluation   Current issues with None Identified       Comments Patient reports no issues with their current mental states, sleep, stress, depression or anxiety. Will follow up with patient in a few weeks for any changes.       Expected Outcomes Short: Continue to exercise regularly to support mental health and notify staff of any changes. Long: maintain mental health and well being through teaching of rehab or prescribed medications independently.       Interventions Encouraged to attend Pulmonary Rehabilitation for the exercise       Continue Psychosocial Services  Follow up required by staff                Psychosocial Discharge (Final Psychosocial Re-Evaluation):  Psychosocial Re-Evaluation - 04/02/23 0954       Psychosocial Re-Evaluation   Current issues with None Identified    Comments Patient reports no issues with their current mental states, sleep, stress, depression or anxiety. Will follow up with patient in a few weeks for any changes.    Expected Outcomes Short: Continue to exercise regularly to support mental health and notify staff of any changes. Long: maintain mental health and well being through teaching of rehab or prescribed medications independently.    Interventions Encouraged to attend Pulmonary Rehabilitation for the exercise    Continue Psychosocial Services  Follow up required by staff             Education: Education Goals: Education classes will be provided on a weekly basis, covering required topics. Participant will state  understanding/return demonstration of topics presented.  Learning Barriers/Preferences:  Learning Barriers/Preferences - 03/05/23 0913       Learning Barriers/Preferences   Learning Barriers None    Learning Preferences None             General Pulmonary Education Topics:  Infection Prevention: - Provides verbal and written material to individual with discussion of infection control including proper hand washing and proper equipment cleaning during exercise session. Flowsheet Row Pulmonary Rehab from 03/12/2023 in [facility] Cardiac and Pulmonary Rehab  Date 03/05/23  Educator [facility]  Instruction Review Code 1- Verbalizes Understanding       Falls Prevention: - Provides verbal and written material to individual with discussion of falls prevention and safety. Flowsheet Row Pulmonary Rehab from 03/12/2023 in [facility] Cardiac and Pulmonary Rehab  Date 03/05/23  Educator [facility]  Instruction Review Code 1- Verbalizes Understanding       Chronic Lung Disease Review: - Group verbal instruction with posters, models, PowerPoint presentations and videos,  to review new updates, new respiratory medications, new advancements in procedures and treatments. Providing information on websites and "800" numbers for continued self-education. Includes information about supplement oxygen, available portable oxygen systems, continuous and intermittent flow rates, oxygen safety, concentrators, and Medicare reimbursement for oxygen. Explanation of Pulmonary Drugs, including class, frequency, complications, importance of spacers, rinsing mouth after steroid MDI's, and proper cleaning methods for nebulizers. Review of basic lung anatomy and physiology related to function, structure, and complications of lung disease. Review of risk factors. Discussion about methods for diagnosing sleep apnea and types of masks and machines for OSA. Includes a review of the use of types of environmental controls: home humidity, furnaces,  filters, dust mite/pet prevention, HEPA vacuums. Discussion about weather changes, air quality and the benefits of nasal washing. Instruction on Warning signs, infection symptoms, calling MD promptly, preventive  modes, and value of vaccinations. Review of effective airway clearance, coughing and/or vibration techniques. Emphasizing that all should Create an Action Plan. Written material given at graduation.   AED/CPR: - Group verbal and written instruction with the use of models to demonstrate the basic use of the AED with the basic ABC's of resuscitation.    Anatomy and Cardiac Procedures: - Group verbal and visual presentation and models provide information about basic cardiac anatomy and function. Reviews the testing methods done to diagnose heart disease and the outcomes of the test results. Describes the treatment choices: Medical Management, Angioplasty, or Coronary Bypass Surgery for treating various heart conditions including Myocardial Infarction, Angina, Valve Disease, and Cardiac Arrhythmias.  Written material given at graduation. Flowsheet Row Pulmonary Rehab from 03/12/2023 in [facility] Cardiac and Pulmonary Rehab  Education need identified 03/12/23       Medication Safety: - Group verbal and visual instruction to review commonly prescribed medications for heart and lung disease. Reviews the medication, class of the drug, and side effects. Includes the steps to properly store meds and maintain the prescription regimen.  Written material given at graduation.   Other: -Provides group and verbal instruction on various topics (see comments)   Knowledge Questionnaire Score:  Knowledge Questionnaire Score - 03/12/23 1051       Knowledge Questionnaire Score   Pre Score 19/26              Core Components/Risk Factors/Patient Goals at Admission:  Personal Goals and Risk Factors at Admission - 03/05/23 0913       Core Components/Risk Factors/Patient Goals on Admission    Weight  Management Yes;Weight Loss    Intervention Weight Management: Develop a combined nutrition and exercise program designed to reach desired caloric intake, while maintaining appropriate intake of nutrient and fiber, sodium and fats, and appropriate energy expenditure required for the weight goal.;Weight Management: Provide education and appropriate resources to help participant work on and attain dietary goals.;Weight Management/Obesity: Establish reasonable short term and long term weight goals.    Expected Outcomes Short Term: Continue to assess and modify interventions until short term weight is achieved;Long Term: Adherence to nutrition and physical activity/exercise program aimed toward attainment of established weight goal;Understanding recommendations for meals to include 15-35% energy as protein, 25-35% energy from fat, 35-60% energy from carbohydrates, less than 200mg  of dietary cholesterol, 20-35 gm of total fiber daily;Weight Loss: Understanding of general recommendations for a balanced deficit meal plan, which promotes 1-2 lb weight loss per week and includes a negative energy balance of [phone] kcal/d;Understanding of distribution of calorie intake throughout the day with the consumption of 4-5 meals/snacks    Improve shortness of breath with ADL's Yes    Intervention Provide education, individualized exercise plan and daily activity instruction to help decrease symptoms of SOB with activities of daily living.    Expected Outcomes Short Term: Improve cardiorespiratory fitness to achieve a reduction of symptoms when performing ADLs;Long Term: Be able to perform more ADLs without symptoms or delay the onset of symptoms    Diabetes Yes    Intervention Provide education about signs/symptoms and action to take for hypo/hyperglycemia.;Provide education about proper nutrition, including hydration, and aerobic/resistive exercise prescription along with prescribed medications to achieve blood glucose in  normal ranges: Fasting glucose 65-99 mg/dL    Expected Outcomes Short Term: Participant verbalizes understanding of the signs/symptoms and immediate care of hyper/hypoglycemia, proper foot care and importance of medication, aerobic/resistive exercise and nutrition plan for blood glucose control.;Long Term:  Attainment of HbA1C < 7%.    Heart Failure Yes    Intervention Provide a combined exercise and nutrition program that is supplemented with education, support and counseling about heart failure. Directed toward relieving symptoms such as shortness of breath, decreased exercise tolerance, and extremity edema.    Expected Outcomes Improve functional capacity of life;Short term: Attendance in program 2-3 days a week with increased exercise capacity. Reported lower sodium intake. Reported increased fruit and vegetable intake. Reports medication compliance.;Short term: Daily weights obtained and reported for increase. Utilizing diuretic protocols set by physician.;Long term: Adoption of self-care skills and reduction of barriers for early signs and symptoms recognition and intervention leading to self-care maintenance.    Hypertension Yes    Intervention Provide education on lifestyle modifcations including regular physical activity/exercise, weight management, moderate sodium restriction and increased consumption of fresh fruit, vegetables, and low fat dairy, alcohol moderation, and smoking cessation.;Monitor prescription use compliance.    Expected Outcomes Short Term: Continued assessment and intervention until BP is < 140/34mm HG in hypertensive participants. < 130/6mm HG in hypertensive participants with diabetes, heart failure or chronic kidney disease.;Long Term: Maintenance of blood pressure at goal levels.    Lipids Yes    Intervention Provide education and support for participant on nutrition & aerobic/resistive exercise along with prescribed medications to achieve LDL 70mg , HDL >40mg .    Expected  Outcomes Short Term: Participant states understanding of desired cholesterol values and is compliant with medications prescribed. Participant is following exercise prescription and nutrition guidelines.;Long Term: Cholesterol controlled with medications as prescribed, with individualized exercise RX and with personalized nutrition plan. Value goals: LDL < 70mg , HDL > 40 mg.             Education:Diabetes - Individual verbal and written instruction to review signs/symptoms of diabetes, desired ranges of glucose level fasting, after meals and with exercise. Acknowledge that pre and post exercise glucose checks will be done for 3 sessions at entry of program. Flowsheet Row Pulmonary Rehab from 03/12/2023 in [facility] Cardiac and Pulmonary Rehab  Date 03/05/23  Educator [facility]  Instruction Review Code 1- Verbalizes Understanding       Know Your Numbers and Heart Failure: - Group verbal and visual instruction to discuss disease risk factors for cardiac and pulmonary disease and treatment options.  Reviews associated critical values for Overweight/Obesity, Hypertension, Cholesterol, and Diabetes.  Discusses basics of heart failure: signs/symptoms and treatments.  Introduces Heart Failure Zone chart for action plan for heart failure.  Written material given at graduation. Flowsheet Row Pulmonary Rehab from 03/12/2023 in [facility] Cardiac and Pulmonary Rehab  Education need identified 03/12/23       Core Components/Risk Factors/Patient Goals Review:   Goals and Risk Factor Review     Row Name 04/02/23 0952             Core Components/Risk Factors/Patient Goals Review   Personal Goals Review Improve shortness of breath with ADL's       Review Spoke to patient about their shortness of breath and what they can do to improve. Patient has been informed of breathing techniques when starting the program. Patient is informed to tell staff if they have had any med changes and that certain meds they are taking or  not taking can be causing shortness of breath.       Expected Outcomes Short: Attend [facility] regularly to improve shortness of breath with ADL's. Long: maintain independence with ADL's  Core Components/Risk Factors/Patient Goals at Discharge (Final Review):   Goals and Risk Factor Review - 04/02/23 0952       Core Components/Risk Factors/Patient Goals Review   Personal Goals Review Improve shortness of breath with ADL's    Review Spoke to patient about their shortness of breath and what they can do to improve. Patient has been informed of breathing techniques when starting the program. Patient is informed to tell staff if they have had any med changes and that certain meds they are taking or not taking can be causing shortness of breath.    Expected Outcomes Short: Attend [facility] regularly to improve shortness of breath with ADL's. Long: maintain independence with ADL's             ITP Comments:  ITP Comments     Row Name 03/05/23 0914 03/12/23 1027 03/18/23 1235 03/24/23 0956 04/15/23 0848   ITP Comments Virtual Visit completed. Patient informed on EP and RD appointment and 6 Minute walk test. Patient also informed of patient health questionnaires on My Chart. Patient Verbalizes understanding. Visit diagnosis can be found in [facility] 02/03/2023. Completed and gym orientation. Initial ITP created and sent for review to Dr. Jinny Sanders, Medical Director. 30 Day review completed. Medical Director ITP review done, changes made as directed, and signed approval by Medical Director.   new to program First full day of exercise!  Patient was oriented to gym and equipment including functions, settings, policies, and procedures.  Patient's individual exercise prescription and treatment plan were reviewed.  All starting workloads were established based on the results of the 6 minute walk test done at initial orientation visit.  The plan for exercise progression was also  introduced and progression will be customized based on patient's performance and goals. 30 Day review completed. Medical Director ITP review done, changes made as directed, and signed approval by Medical Director.    new to program            Comments:

## 2023-04-16 ENCOUNTER — Encounter: Payer: Medicare HMO | Admitting: *Deleted

## 2023-04-16 DIAGNOSIS — I5022 Chronic systolic (congestive) heart failure: Secondary | ICD-10-CM

## 2023-04-16 DIAGNOSIS — Z5189 Encounter for other specified aftercare: Secondary | ICD-10-CM | POA: Diagnosis not present

## 2023-04-16 NOTE — Progress Notes (Signed)
Daily Session Note  Patient Details  Name: Mckenzie Wright MRN: [id number] Date of Birth: 08/24/1945 Referring Provider:   Doristine Devoid Pulmonary Rehab from 03/12/2023 in [facility] Cardiac and Pulmonary Rehab  Referring Provider Dr. Dorothyann Peng, MD       Encounter Date: 04/16/2023  Check In:  Session Check In - 04/16/23 0950       Check-In   Supervising physician immediately available to respond to emergencies See telemetry face sheet for immediately available ER MD    Location [facility]-Cardiac & Pulmonary Rehab    Staff Present Cora Collum, RN, BSN, CCRP;Joseph Hood, RCP,RRT,BSRT;Maxon Waelder BS, , Exercise Physiologist;Jonesha Tsuchiya Katrinka Blazing, RN, [place]    Virtual Visit No    Medication changes reported     No    Fall or balance concerns reported    No    Warm-up and Cool-down Performed on first and last piece of equipment    Resistance Training Performed Yes    VAD Patient? No    PAD/SET Patient? No      Pain Assessment   Currently in Pain? No/denies                Social History   Tobacco Use  Smoking Status Never  Smokeless Tobacco Never    Goals Met:  Independence with exercise equipment Exercise tolerated well No report of concerns or symptoms today Strength training completed today  Goals Unmet:  Not Applicable  Comments: Pt able to follow exercise prescription today without complaint.  Will continue to monitor for progression.    Dr. Bethann Punches is Medical Director for [facility] Cardiac Rehabilitation.  Dr. Vida Rigger is Medical Director for [facility] Pulmonary Rehabilitation.

## 2023-04-21 ENCOUNTER — Encounter: Payer: Medicare HMO | Attending: Internal Medicine | Admitting: *Deleted

## 2023-04-21 DIAGNOSIS — I5022 Chronic systolic (congestive) heart failure: Secondary | ICD-10-CM | POA: Insufficient documentation

## 2023-04-21 NOTE — Progress Notes (Signed)
Daily Session Note  Patient Details  Name: Mckenzie Wright MRN: [id number] Date of Birth: Oct 04, 1945 Referring Provider:   Doristine Devoid Pulmonary Rehab from 03/12/2023 in [facility] Cardiac and Pulmonary Rehab  Referring Provider Dr. Dorothyann Peng, MD       Encounter Date: 04/21/2023  Check In:  Session Check In - 04/21/23 0928       Check-In   Supervising physician immediately available to respond to emergencies See telemetry face sheet for immediately available ER MD    Location [facility]-Cardiac & Pulmonary Rehab    Staff Present Rory Percy, MS, Exercise Physiologist;Embry Huss, RN, BSN, CCRP;Noah Tickle, BS, Exercise Physiologist;Maxon Conetta BS, , Exercise Physiologist    Virtual Visit No    Medication changes reported     No    Fall or balance concerns reported    No    Warm-up and Cool-down Performed on first and last piece of equipment    Resistance Training Performed Yes    VAD Patient? No    PAD/SET Patient? No      Pain Assessment   Currently in Pain? No/denies                Social History   Tobacco Use  Smoking Status Never  Smokeless Tobacco Never    Goals Met:  Proper associated with RPD/PD & O2 Sat Independence with exercise equipment Exercise tolerated well No report of concerns or symptoms today  Goals Unmet:  Not Applicable  Comments: Pt able to follow exercise prescription today without complaint.  Will continue to monitor for progression.    Dr. Bethann Punches is Medical Director for [facility] Cardiac Rehabilitation.  Dr. Vida Rigger is Medical Director for [facility] Pulmonary Rehabilitation.

## 2023-04-23 ENCOUNTER — Encounter: Payer: Medicare HMO | Admitting: *Deleted

## 2023-04-23 DIAGNOSIS — I5022 Chronic systolic (congestive) heart failure: Secondary | ICD-10-CM

## 2023-04-23 NOTE — Progress Notes (Signed)
Daily Session Note  Patient Details  Name: Mckenzie Wright MRN: [id number] Date of Birth: 1945-09-17 Referring Provider:   Doristine Devoid Pulmonary Rehab from 03/12/2023 in [facility] Cardiac and Pulmonary Rehab  Referring Provider Dr. Dorothyann Peng, MD       Encounter Date: 04/23/2023  Check In:  Session Check In - 04/23/23 0945       Check-In   Supervising physician immediately available to respond to emergencies See telemetry face sheet for immediately available ER MD    Location [facility]-Cardiac & Pulmonary Rehab    Staff Present Ronette Deter, BS, Exercise Physiologist;Susanne Bice, RN, BSN, CCRP;Margaret Best, MS, Exercise Physiologist;Josiephine Simao Katrinka Blazing, RN, ADN    Virtual Visit No    Medication changes reported     No    Fall or balance concerns reported    No    Warm-up and Cool-down Performed on first and last piece of equipment    Resistance Training Performed Yes    VAD Patient? No    PAD/SET Patient? No      Pain Assessment   Currently in Pain? No/denies                Social History   Tobacco Use  Smoking Status Never  Smokeless Tobacco Never    Goals Met:  Independence with exercise equipment Exercise tolerated well No report of concerns or symptoms today Strength training completed today  Goals Unmet:  Not Applicable  Comments: Pt able to follow exercise prescription today without complaint.  Will continue to monitor for progression.    Dr. Bethann Punches is Medical Director for [facility] Cardiac Rehabilitation.  Dr. Vida Rigger is Medical Director for [facility] Pulmonary Rehabilitation.

## 2023-04-28 DIAGNOSIS — I5022 Chronic systolic (congestive) heart failure: Secondary | ICD-10-CM

## 2023-04-28 NOTE — Progress Notes (Signed)
Daily Session Note  Patient Details  Name: Mckenzie Wright MRN: [id number] Date of Birth: 03-28-1946 Referring Provider:   Doristine Devoid Pulmonary Rehab from 03/12/2023 in [facility] Cardiac and Pulmonary Rehab  Referring Provider Dr. Dorothyann Peng, MD       Encounter Date: 04/28/2023  Check In:  Session Check In - 04/28/23 0933       Check-In   Supervising physician immediately available to respond to emergencies See telemetry face sheet for immediately available ER MD    Location [facility]-Cardiac & Pulmonary Rehab    Staff Present Maxon Conetta BS, , Exercise Physiologist;Omya Winfield, RN, BSN;Margaret Best, MS, Exercise Physiologist;Noah Tickle, BS, Exercise Physiologist    Virtual Visit No    Medication changes reported     No    Fall or balance concerns reported    No    Warm-up and Cool-down Performed on first and last piece of equipment    Resistance Training Performed Yes    VAD Patient? No    PAD/SET Patient? No      Pain Assessment   Currently in Pain? No/denies                Social History   Tobacco Use  Smoking Status Never  Smokeless Tobacco Never    Goals Met:  Proper associated with RPD/PD & O2 Sat Independence with exercise equipment Using PLB without cueing & demonstrates good technique Exercise tolerated well No report of concerns or symptoms today Strength training completed today  Goals Unmet:  Not Applicable  Comments: Pt able to follow exercise prescription today without complaint.  Will continue to monitor for progression.   Dr. Bethann Punches is Medical Director for [facility] Cardiac Rehabilitation.  Dr. Vida Rigger is Medical Director for [facility] Pulmonary Rehabilitation.

## 2023-05-05 ENCOUNTER — Encounter: Payer: Medicare HMO | Admitting: *Deleted

## 2023-05-05 DIAGNOSIS — I5022 Chronic systolic (congestive) heart failure: Secondary | ICD-10-CM | POA: Diagnosis not present

## 2023-05-05 NOTE — Progress Notes (Signed)
Daily Session Note  Patient Details  Name: Mckenzie Wright MRN: [id number] Date of Birth: [birth year] Referring Provider:   Dor[facility] from 03/12/2023 in [facility]  Referring Provider Dr. Dorothyann Peng, MD       Encounter Date: 05/05/2023  Check In:  Session Check In - 05/05/23 0951       Check-In   Supervising physician immediately available to respond to emergencies See telemetry face sheet for immediately available ER MD    Location [facility]    Staff Present Cora Collum, RN, BSN, CCRP;Noah Tickle, BS, Exercise Physiologist;Margaret Best, MS, Exercise Physiologist;Maxon Conetta BS, , Exercise Physiologist    Virtual Visit No    Medication changes reported     No    Fall or balance concerns reported    No    Warm-up and Cool-down Performed on first and last piece of equipment    Resistance Training Performed Yes    VAD Patient? No    PAD/SET Patient? No      Pain Assessment   Currently in Pain? No/denies                Social History   Tobacco Use  Smoking Status Never  Smokeless Tobacco Never    Goals Met:  Proper associated with RPD/PD & O2 Sat Independence with exercise equipment Exercise tolerated well No report of concerns or symptoms today  Goals Unmet:  Not Applicable  Comments: Pt able to follow exercise prescription today without complaint.  Will continue to monitor for progression.    Dr. Bethann Punches is Medical Director for [facility].  Dr. Vida Rigger is Medical Director for [facility].

## 2023-05-06 ENCOUNTER — Encounter: Payer: Self-pay | Admitting: *Deleted

## 2023-05-06 DIAGNOSIS — I5022 Chronic systolic (congestive) heart failure: Secondary | ICD-10-CM

## 2023-05-06 NOTE — Progress Notes (Signed)
Pulmonary Individual Treatment Plan  Patient Details  Name: Mckenzie Wright MRN: [id number] Date of Birth: 07-01-1945 Referring Provider:   Doristine Devoid Pulmonary Rehab from 03/12/2023 in [facility] Cardiac and Pulmonary Rehab  Referring Provider Dr. Dorothyann Peng, MD       Initial Encounter Date:  Flowsheet Row Pulmonary Rehab from 03/12/2023 in [facility] Cardiac and Pulmonary Rehab  Date 03/12/23       Visit Diagnosis: Heart failure, chronic systolic (HCC)  Patient's Home Medications on Admission:  Current Outpatient Medications:    aspirin EC 81 MG tablet, Take 81 mg by mouth daily., Disp: , Rfl:    Calcium Carbonate-Vitamin D 600-200 MG-UNIT TABS, Take by mouth., Disp: , Rfl:    heparin 38756 UT/250ML infusion, Inject 1,200 Units/hr into the vein continuous. (Patient not taking: Reported on 03/05/2023), Disp: , Rfl:    insulin NPH-regular Human (70-30) 100 UNIT/ML injection, Inject 50 Units into the skin daily with breakfast., Disp: , Rfl:    insulin NPH-regular Human (70-30) 100 UNIT/ML injection, Inject 25 Units into the skin daily with supper. (Patient not taking: Reported on 03/05/2023), Disp: , Rfl:    levothyroxine (SYNTHROID) 88 MCG tablet, Take by mouth., Disp: , Rfl:    metoprolol tartrate (LOPRESSOR) 25 MG tablet, Take 0.5 tablets (12.5 mg total) by mouth 2 (two) times daily., Disp: , Rfl:    rosuvastatin (CRESTOR) 20 MG tablet, Take 1 tablet (20 mg total) by mouth daily at 6 (six) AM., Disp: , Rfl:    torsemide (DEMADEX) 20 MG tablet, TAKE 1 TABLET BY MOUTH ONCE DAILY IN THE MORNING AS NEEDED FOR EDEMA IN LEGS, Disp: , Rfl:    vitamin B-12 (CYANOCOBALAMIN) 1000 MCG tablet, Take 1,000 mcg by mouth daily., Disp: , Rfl:   Past Medical History: Past Medical History:  Diagnosis Date   Asthma    Diabetes (HCC)    Hypertension    Stroke (HCC)     Tobacco Use: Social History   Tobacco Use  Smoking Status Never  Smokeless Tobacco Never    Labs: Review Flowsheet        Latest Ref Rng & Units 03/16/2021 03/17/2021  Labs for ITP Cardiac and Pulmonary Rehab  Cholestrol 0 - 200 mg/dL - 433   LDL (calc) 0 - 99 mg/dL - 64   HDL-C >29 mg/dL - 41   Trlycerides <518 mg/dL - 841   Hemoglobin Y6A 4.8 - 5.6 % 9.2  -  Bicarbonate 20.0 - 28.0 mmol/L 30.2  -  O2 Saturation % 43.2  -    Details             Pulmonary Assessment Scores:  Pulmonary Assessment Scores     Row Name 03/12/23 1048         ADL UCSD   ADL Phase Entry     SOB Score total 32     Rest 0     Walk 2     Stairs 2     Bath 0     Dress 0     Shop 2       CAT Score   CAT Score 6       mMRC Score   mMRC Score 1              UCSD: Self-administered rating of dyspnea associated with activities of daily living (ADLs) 6-point scale (0 = "not at all" to 5 = "maximal or unable to do because of breathlessness")  Scoring Scores range from  0 to 120.  Minimally important difference is 5 units  CAT: CAT can identify the health impairment of COPD patients and is better correlated with disease progression.  CAT has a scoring range of zero to 40. The CAT score is classified into four groups of low (less than 10), medium (10 - 20), high (21-30) and very high (31-40) based on the impact level of disease on health status. A CAT score over 10 suggests significant symptoms.  A worsening CAT score could be explained by an exacerbation, poor medication adherence, poor inhaler technique, or progression of COPD or comorbid conditions.  CAT MCID is 2 points  mMRC: mMRC (Modified Medical Research Council) Dyspnea Scale is used to assess the degree of baseline functional disability in patients of respiratory disease due to dyspnea. No minimal important difference is established. A decrease in score of 1 point or greater is considered a positive change.   Pulmonary Function Assessment:  Pulmonary Function Assessment - 03/05/23 0912       Breath   Shortness of Breath No             Exercise  Target Goals: Exercise Program Goal: Individual exercise prescription set using results from initial 6 min walk test and THRR while considering  patient's activity barriers and safety.   Exercise Prescription Goal: Initial exercise prescription builds to 30-45 minutes a day of aerobic activity, 2-3 days per week.  Home exercise guidelines will be given to patient during program as part of exercise prescription that the participant will acknowledge.  Education: Aerobic Exercise: - Group verbal and visual presentation on the components of exercise prescription. Introduces F.I.T.T principle from ACSM for exercise prescriptions.  Reviews F.I.T.T. principles of aerobic exercise including progression. Written material given at graduation. Flowsheet Row Pulmonary Rehab from 03/12/2023 in [facility] Cardiac and Pulmonary Rehab  Education need identified 03/12/23       Education: Resistance Exercise: - Group verbal and visual presentation on the components of exercise prescription. Introduces F.I.T.T principle from ACSM for exercise prescriptions  Reviews F.I.T.T. principles of resistance exercise including progression. Written material given at graduation.    Education: Exercise & Equipment Safety: - Individual verbal instruction and demonstration of equipment use and safety with use of the equipment. Flowsheet Row Pulmonary Rehab from 03/12/2023 in [facility] Cardiac and Pulmonary Rehab  Date 03/05/23  Educator [facility]  Instruction Review Code 1- Verbalizes Understanding       Education: Exercise Physiology & General Exercise Guidelines: - Group verbal and written instruction with models to review the exercise physiology of the cardiovascular system and associated critical values. Provides general exercise guidelines with specific guidelines to those with heart or lung disease.    Education: Flexibility, Balance, Mind/Body Relaxation: - Group verbal and visual presentation with interactive activity on the  components of exercise prescription. Introduces F.I.T.T principle from ACSM for exercise prescriptions. Reviews F.I.T.T. principles of flexibility and balance exercise training including progression. Also discusses the mind body connection.  Reviews various relaxation techniques to help reduce and manage stress (i.e. Deep breathing, progressive muscle relaxation, and visualization). Balance handout provided to take home. Written material given at graduation.   Activity Barriers & Risk Stratification:  Activity Barriers & Cardiac Risk Stratification - 03/12/23 1034       Activity Barriers & Cardiac Risk Stratification   Activity Barriers Deconditioning;Muscular Weakness;Balance Concerns;Other (comment)    Comments Hx of hip surgery    Cardiac Risk Stratification High  6 Minute Walk:  6 Minute Walk     Row Name 03/12/23 1029         6 Minute Walk   Phase Initial     Distance 755 feet     Walk Time 5.67 minutes     # of Rest Breaks 1     MPH 1.51     METS 1.31     RPE 12     Perceived Dyspnea  0     VO2 Peak 4.59     Symptoms No     Resting HR 69 bpm     Resting BP 112/54     Resting Oxygen Saturation  94 %     Exercise Oxygen Saturation  during 6 min walk 91 %     Max Ex. HR 87 bpm     Max Ex. BP 126/50     2 Minute Post BP 110/56       Interval HR   1 Minute HR 81     2 Minute HR 82     3 Minute HR 86     4 Minute HR 85     5 Minute HR 85     6 Minute HR 87     2 Minute Post HR 69     Interval Heart Rate? Yes       Interval Oxygen   Interval Oxygen? Yes     Baseline Oxygen Saturation % 94 %     1 Minute Oxygen Saturation % 92 %     1 Minute Liters of Oxygen 0 L  RA     2 Minute Oxygen Saturation % 92 %     2 Minute Liters of Oxygen 0 L     3 Minute Oxygen Saturation % 94 %     3 Minute Liters of Oxygen 0 L     4 Minute Oxygen Saturation % 97 %     4 Minute Liters of Oxygen 0 L     5 Minute Oxygen Saturation % 91 %     5 Minute Liters of  Oxygen 0 L     6 Minute Oxygen Saturation % 93 %     6 Minute Liters of Oxygen 0 L     2 Minute Post Oxygen Saturation % 95 %     2 Minute Post Liters of Oxygen 0 L             Oxygen Initial Assessment:  Oxygen Initial Assessment - 03/05/23 0912       Home Oxygen   Home Oxygen Device None    Sleep Oxygen Prescription None    Home Exercise Oxygen Prescription None    Home Resting Oxygen Prescription None      Initial 6 min Walk   Oxygen Used None      Program Oxygen Prescription   Program Oxygen Prescription None      Intervention   Short Term Goals To learn and exhibit compliance with exercise, home and travel O2 prescription;To learn and understand importance of monitoring SPO2 with pulse oximeter and demonstrate accurate use of the pulse oximeter.;To learn and understand importance of maintaining oxygen saturations>88%;To learn and demonstrate proper pursed lip breathing techniques or other breathing techniques.     Long  Term Goals Exhibits compliance with exercise, home  and travel O2 prescription;Verbalizes importance of monitoring SPO2 with pulse oximeter and return demonstration;Maintenance of O2 saturations>88%;Exhibits proper breathing techniques, such as pursed lip breathing or other  method taught during program session;Compliance with respiratory medication             Oxygen Re-Evaluation:  Oxygen Re-Evaluation     Row Name 03/24/23 0956 04/02/23 0951 05/05/23 0943         Program Oxygen Prescription   Program Oxygen Prescription None None None       Home Oxygen   Home Oxygen Device None None None     Sleep Oxygen Prescription None None None     Home Exercise Oxygen Prescription None None None     Home Resting Oxygen Prescription None None None     Compliance with Home Oxygen Use Yes -- Yes       Goals/Expected Outcomes   Short Term Goals To learn and demonstrate proper pursed lip breathing techniques or other breathing techniques.  To learn and  demonstrate proper pursed lip breathing techniques or other breathing techniques.  To learn and demonstrate proper pursed lip breathing techniques or other breathing techniques.      Long  Term Goals Exhibits proper breathing techniques, such as pursed lip breathing or other method taught during program session Exhibits proper breathing techniques, such as pursed lip breathing or other method taught during program session Exhibits proper breathing techniques, such as pursed lip breathing or other method taught during program session     Comments Reviewed PLB technique with pt.  Talked about how it works and it's importance in maintaining their exercise saturations. Informed patient how to perform the Pursed Lipped breathing technique. Told patient to Inhale through the nose and out the mouth with pursed lips to keep their airways open, help oxygenate them better, practice when at rest or doing strenuous activity. Patient Verbalizes understanding of technique and will work on and be reiterated during [facility]. Reviewed PLB technique with patient. We encouraged her to practice PLB during exercise. Pt expressed understanding.     Goals/Expected Outcomes Short: Become more profiecient at using PLB. Long: Become independent at using PLB. Short: use PLB with exertion. Long: use PLB on exertion proficiently and independently. Short: use PLB with exertion. Long: use PLB on exertion proficiently and independently.              Oxygen Discharge (Final Oxygen Re-Evaluation):  Oxygen Re-Evaluation - 05/05/23 0943       Program Oxygen Prescription   Program Oxygen Prescription None      Home Oxygen   Home Oxygen Device None    Sleep Oxygen Prescription None    Home Exercise Oxygen Prescription None    Home Resting Oxygen Prescription None    Compliance with Home Oxygen Use Yes      Goals/Expected Outcomes   Short Term Goals To learn and demonstrate proper pursed lip breathing techniques or other  breathing techniques.     Long  Term Goals Exhibits proper breathing techniques, such as pursed lip breathing or other method taught during program session    Comments Reviewed PLB technique with patient. We encouraged her to practice PLB during exercise. Pt expressed understanding.    Goals/Expected Outcomes Short: use PLB with exertion. Long: use PLB on exertion proficiently and independently.             Initial Exercise Prescription:  Initial Exercise Prescription - 03/12/23 1000       Date of Initial Exercise RX and Referring Provider   Date 03/12/23    Referring Provider Dr. Dorothyann Peng, MD      Oxygen   Maintain Oxygen Saturation  88% or higher      Recumbant Bike   Level 1    RPM 50    Watts 15    Minutes 15    METs 1.31      NuStep   Level 1    SPM 80    Minutes 15    METs 1.31      Biostep-RELP   Level 1    SPM 50    Minutes 15    METs 1.31      Track   Laps 12    Minutes 15    METs 1.65      Prescription Details   Frequency (times per week) 2    Duration Progress to 30 minutes of continuous aerobic without signs/symptoms of physical distress      Intensity   THRR 40-80% of Max Heartrate 97-126    Ratings of Perceived Exertion 11-13    Perceived Dyspnea 0-4      Progression   Progression Continue to progress workloads to maintain intensity without signs/symptoms of physical distress.      Resistance Training   Training Prescription Yes    Weight 2 lb    Reps 10-15             Perform Capillary Blood Glucose checks as needed.  Exercise Prescription Changes:   Exercise Prescription Changes     Row Name 03/12/23 1000 04/02/23 1600 04/15/23 1100         Response to Exercise   Blood Pressure (Admit) 112/54 110/58 114/58     Blood Pressure (Exercise) 126/50 128/64 136/66     Blood Pressure (Exit) 110/56 106/58 116/62     Heart Rate (Admit) 69 bpm 76 bpm 77 bpm     Heart Rate (Exercise) 87 bpm 105 bpm 109 bpm     Heart Rate  (Exit) 69 bpm 81 bpm 94 bpm     Oxygen Saturation (Admit) 94 % 94 % 92 %     Oxygen Saturation (Exercise) 91 % 89 % 91 %     Oxygen Saturation (Exit) 95 % 96 % 94 %     Rating of Perceived Exertion (Exercise) 12 15 15      Perceived Dyspnea (Exercise) 0 0 2     Symptoms none none none     Comments Results First two days of exercise --     Duration -- Progress to 30 minutes of  aerobic without signs/symptoms of physical distress Progress to 30 minutes of  aerobic without signs/symptoms of physical distress     Intensity -- THRR unchanged THRR unchanged       Progression   Progression -- Continue to progress workloads to maintain intensity without signs/symptoms of physical distress. Continue to progress workloads to maintain intensity without signs/symptoms of physical distress.     Average METs -- 1.83 2.17       Resistance Training   Training Prescription -- Yes Yes     Weight -- 2 lb 2 lb     Reps -- 10-15 10-15       Interval Training   Interval Training -- No No       NuStep   Level -- 2 2     Minutes -- 15 15     METs -- 2.5 2       Track   Laps -- 10 26     Minutes -- 15 15     METs -- 1.54 2.41  Oxygen   Maintain Oxygen Saturation -- 88% or higher 88% or higher              Exercise Comments:   Exercise Comments     Row Name 03/24/23 0955           Exercise Comments First full day of exercise!  Patient was oriented to gym and equipment including functions, settings, policies, and procedures.  Patient's individual exercise prescription and treatment plan were reviewed.  All starting workloads were established based on the results of the 6 minute walk test done at initial orientation visit.  The plan for exercise progression was also introduced and progression will be customized based on patient's performance and goals.                Exercise Goals and Review:   Exercise Goals     Row Name 03/12/23 1035             Exercise Goals    Increase Physical Activity Yes       Intervention Provide advice, education, support and counseling about physical activity/exercise needs.;Develop an individualized exercise prescription for aerobic and resistive training based on initial evaluation findings, risk stratification, comorbidities and participant's personal goals.       Expected Outcomes Long Term: Add in home exercise to make exercise part of routine and to increase amount of physical activity.;Short Term: Attend rehab on a regular basis to increase amount of physical activity.;Long Term: Exercising regularly at least 3-5 days a week.       Increase Strength and Stamina Yes       Intervention Provide advice, education, support and counseling about physical activity/exercise needs.;Develop an individualized exercise prescription for aerobic and resistive training based on initial evaluation findings, risk stratification, comorbidities and participant's personal goals.       Expected Outcomes Short Term: Increase workloads from initial exercise prescription for resistance, speed, and METs.;Short Term: Perform resistance training exercises routinely during rehab and add in resistance training at home;Long Term: Improve cardiorespiratory fitness, muscular endurance and strength as measured by increased METs and functional capacity ( )       Able to understand and use rate of perceived exertion (RPE) scale Yes       Intervention Provide education and explanation on how to use RPE scale       Expected Outcomes Short Term: Able to use RPE daily in rehab to express subjective intensity level;Long Term:  Able to use RPE to guide intensity level when exercising independently       Able to understand and use Dyspnea scale Yes       Intervention Provide education and explanation on how to use Dyspnea scale       Expected Outcomes Long Term: Able to use Dyspnea scale to guide intensity level when exercising independently;Short Term: Able to use  Dyspnea scale daily in rehab to express subjective sense of shortness of breath during exertion       Knowledge and understanding of Target Heart Rate Range (THRR) Yes       Intervention Provide education and explanation of THRR including how the numbers were predicted and where they are located for reference       Expected Outcomes Short Term: Able to state/look up THRR;Long Term: Able to use THRR to govern intensity when exercising independently;Short Term: Able to use daily as guideline for intensity in rehab       Able to check pulse independently Yes  Intervention Provide education and demonstration on how to check pulse in carotid and radial arteries.;Review the importance of being able to check your own pulse for safety during independent exercise       Expected Outcomes Short Term: Able to explain why pulse checking is important during independent exercise;Long Term: Able to check pulse independently and accurately       Understanding of Exercise Prescription Yes       Intervention Provide education, explanation, and written materials on patient's individual exercise prescription       Expected Outcomes Short Term: Able to explain program exercise prescription;Long Term: Able to explain home exercise prescription to exercise independently                Exercise Goals Re-Evaluation :  Exercise Goals Re-Evaluation     Row Name 03/24/23 0954 04/02/23 1620 04/15/23 1123         Exercise Goal Re-Evaluation   Exercise Goals Review Knowledge and understanding of Target Heart Rate Range (THRR);Able to understand and use rate of perceived exertion (RPE) scale;Able to understand and use Dyspnea scale;Understanding of Exercise Prescription Increase Physical Activity;Increase Strength and Stamina;Understanding of Exercise Prescription Increase Physical Activity;Increase Strength and Stamina;Understanding of Exercise Prescription     Comments Reviewed RPE and dyspnea scale, THR and program  prescription with pt today.  Pt voiced understanding and was given a copy of goals to take home. Mckenzie Wright is off to a good start in the program. She did well walking the track and walked up to 10 laps. She also did well on the T4 nustep and improved to level 2. She also did well with 2 lb hand weights for resistance training. We will continue to monitor her progress in the program. Mckenzie Wright is doing well in rehab. She recently walked 25 laps on the track after previously only walking 10 laps! She also has continued to work at level 2 on the T4 nustep and use 2 lb hand weights for resistance training. We will continue to monitor her progress in the program.     Expected Outcomes Short: Use RPE daily to regulate intensity. Long: Follow program prescription in THR. Short: Continue to follow current exercise prescription. Long: Continue exercise to improve strength and stamina. Short: Increase to level 3 on the T4 nustep. Long: Continue exercise to improve strength and stamina.              Discharge Exercise Prescription (Final Exercise Prescription Changes):  Exercise Prescription Changes - 04/15/23 1100       Response to Exercise   Blood Pressure (Admit) 114/58    Blood Pressure (Exercise) 136/66    Blood Pressure (Exit) 116/62    Heart Rate (Admit) 77 bpm    Heart Rate (Exercise) 109 bpm    Heart Rate (Exit) 94 bpm    Oxygen Saturation (Admit) 92 %    Oxygen Saturation (Exercise) 91 %    Oxygen Saturation (Exit) 94 %    Rating of Perceived Exertion (Exercise) 15    Perceived Dyspnea (Exercise) 2    Symptoms none    Duration Progress to 30 minutes of  aerobic without signs/symptoms of physical distress    Intensity THRR unchanged      Progression   Progression Continue to progress workloads to maintain intensity without signs/symptoms of physical distress.    Average METs 2.17      Resistance Training   Training Prescription Yes    Weight 2 lb    Reps 10-15  Interval Training    Interval Training No      NuStep   Level 2    Minutes 15    METs 2      Track   Laps 26    Minutes 15    METs 2.41      Oxygen   Maintain Oxygen Saturation 88% or higher             Nutrition:  Target Goals: Understanding of nutrition guidelines, daily intake of sodium 1500mg , cholesterol 200mg , calories 30% from fat and 7% or less from saturated fats, daily to have 5 or more servings of fruits and vegetables.  Education: All About Nutrition: -Group instruction provided by verbal, written material, interactive activities, discussions, models, and posters to present general guidelines for heart healthy nutrition including fat, fiber, MyPlate, the role of sodium in heart healthy nutrition, utilization of the nutrition label, and utilization of this knowledge for meal planning. Follow up email sent as well. Written material given at graduation. Flowsheet Row Pulmonary Rehab from 03/12/2023 in [facility] Cardiac and Pulmonary Rehab  Education need identified 03/12/23       Biometrics:  Pre Biometrics - 03/12/23 1035       Pre Biometrics   Height 5\' 6"  (1.676 m)    Weight 166 lb 12.8 oz (75.7 kg)    Waist Circumference 35 inches    Hip Circumference 43 inches    Waist to Hip Ratio 0.81 %    BMI (Calculated) 26.94    Single Leg Stand 0 seconds              Nutrition Therapy Plan and Nutrition Goals:   Nutrition Assessments:  MEDIFICTS Score Key: >=70 Need to make dietary changes  40-70 Heart Healthy Diet <= 40 Therapeutic Level Cholesterol Diet  Flowsheet Row Pulmonary Rehab from 03/12/2023 in [facility] Cardiac and Pulmonary Rehab  Picture Your Plate Total Score on Admission 62      Picture Your Plate Scores: <11 Unhealthy dietary pattern with much room for improvement. 41-50 Dietary pattern unlikely to meet recommendations for good health and room for improvement. 51-60 More healthful dietary pattern, with some room for improvement.  >60 Healthy dietary pattern,  although there may be some specific behaviors that could be improved.   Nutrition Goals Re-Evaluation:  Nutrition Goals Re-Evaluation     Row Name 04/02/23 0953 05/05/23 0940           Goals   Comment Patient was informed on why it is important to maintain a balanced diet when dealing with Respiratory issues. Explained that it takes a lot of energy to breath and when they are short of breath often they will need to have a good diet to help keep up with the calories they are expending for breathing. Pt has not spoken with the RD at this time. She was previously informed of why it is important to mantain a balanced diet when dealing with respiratory issues.      Expected Outcome Short: Choose and plan snacks accordingly to patients caloric intake to improve breathing. Long: Maintain a diet independently that meets their caloric intake to aid in daily shortness of breath. Short: Choose and plan snacks accordingly to patients caloric intake to improve breathing. Long: Maintain a diet independently that meets their caloric intake to aid in daily shortness of breath.               Nutrition Goals Discharge (Final Nutrition Goals Re-Evaluation):  Nutrition Goals Re-Evaluation -  05/05/23 0940       Goals   Comment Pt has not spoken with the RD at this time. She was previously informed of why it is important to mantain a balanced diet when dealing with respiratory issues.    Expected Outcome Short: Choose and plan snacks accordingly to patients caloric intake to improve breathing. Long: Maintain a diet independently that meets their caloric intake to aid in daily shortness of breath.             Psychosocial: Target Goals: Acknowledge presence or absence of significant depression and/or stress, maximize coping skills, provide positive support system. Participant is able to verbalize types and ability to use techniques and skills needed for reducing stress and depression.   Education:  Stress, Anxiety, and Depression - Group verbal and visual presentation to define topics covered.  Reviews how body is impacted by stress, anxiety, and depression.  Also discusses healthy ways to reduce stress and to treat/manage anxiety and depression.  Written material given at graduation.   Education: Sleep Hygiene -Provides group verbal and written instruction about how sleep can affect your health.  Define sleep hygiene, discuss sleep cycles and impact of sleep habits. Review good sleep hygiene tips.    Initial Review & Psychosocial Screening:  Initial Psych Review & Screening - 03/05/23 0915       Initial Review   Current issues with None Identified      Family Dynamics   Good Support System? Yes    Comments She can look to her falimy for support. She can look to her husband, son and daughter for support.      Barriers   Psychosocial barriers to participate in program The patient should benefit from training in stress management and relaxation.      Screening Interventions   Interventions To provide support and resources with identified psychosocial needs;Provide feedback about the scores to participant;Encouraged to exercise    Expected Outcomes Short Term goal: Utilizing psychosocial counselor, staff and physician to assist with identification of specific Stressors or current issues interfering with healing process. Setting desired goal for each stressor or current issue identified.;Long Term Goal: Stressors or current issues are controlled or eliminated.;Short Term goal: Identification and review with participant of any Quality of Life or Depression concerns found by scoring the questionnaire.;Long Term goal: The participant improves quality of Life and PHQ9 Scores as seen by post scores and/or verbalization of changes             Quality of Life Scores:  Scores of 19 and below usually indicate a poorer quality of life in these areas.  A difference of  2-3 points is a  clinically meaningful difference.  A difference of 2-3 points in the total score of the Quality of Life Index has been associated with significant improvement in overall quality of life, self-image, physical symptoms, and general health in studies assessing change in quality of life.  PHQ-9: Review Flowsheet       03/12/2023  Depression screen PHQ 2/9  Decreased Interest 0  Down, Depressed, Hopeless 0  PHQ - 2 Score 0  Altered sleeping 0  Tired, decreased energy 1  Change in appetite 0  Feeling bad or failure about yourself  0  Trouble concentrating 0  Moving slowly or fidgety/restless 0  Suicidal thoughts 0  PHQ-9 Score 1  Difficult doing work/chores Not difficult at all    Details           Interpretation of  Total Score  Total Score Depression Severity:  1-4 = Minimal depression, 5-9 = Mild depression, 10-14 = Moderate depression, 15-19 = Moderately severe depression, 20-27 = Severe depression   Psychosocial Evaluation and Intervention:  Psychosocial Evaluation - 03/05/23 0917       Psychosocial Evaluation & Interventions   Interventions Relaxation education;Stress management education;Encouraged to exercise with the program and follow exercise prescription    Comments She can look to her falimy for support. She can look to her husband, son and daughter for support.    Expected Outcomes Short: Start [facility] to help with mood. Long: Maintain a healthy mental state.    Continue Psychosocial Services  Follow up required by staff             Psychosocial Re-Evaluation:  Psychosocial Re-Evaluation     Row Name 04/02/23 0954 05/05/23 2202           Psychosocial Re-Evaluation   Current issues with None Identified Current Stress Concerns      Comments Patient reports no issues with their current mental states, sleep, stress, depression or anxiety. Will follow up with patient in a few weeks for any changes. Mckenzie Wright reports no major stressors at this time. She states  that she does not enjoy exercise, but knows that is heping her in the long run. Her husband has been a good supports system for her but she likes being independent. She also reports sleeping well at this time.      Expected Outcomes Short: Continue to exercise regularly to support mental health and notify staff of any changes. Long: maintain mental health and well being through teaching of rehab or prescribed medications independently. Short: Continue to relieve stress through healthy avenues. Long: maintain positive outlook.      Interventions Encouraged to attend Pulmonary Rehabilitation for the exercise Encouraged to attend Pulmonary Rehabilitation for the exercise      Continue Psychosocial Services  Follow up required by staff Follow up required by staff               Psychosocial Discharge (Final Psychosocial Re-Evaluation):  Psychosocial Re-Evaluation - 05/05/23 0928       Psychosocial Re-Evaluation   Current issues with Current Stress Concerns    Comments Mckenzie Wright reports no major stressors at this time. She states that she does not enjoy exercise, but knows that is heping her in the long run. Her husband has been a good supports system for her but she likes being independent. She also reports sleeping well at this time.    Expected Outcomes Short: Continue to relieve stress through healthy avenues. Long: maintain positive outlook.    Interventions Encouraged to attend Pulmonary Rehabilitation for the exercise    Continue Psychosocial Services  Follow up required by staff             Education: Education Goals: Education classes will be provided on a weekly basis, covering required topics. Participant will state understanding/return demonstration of topics presented.  Learning Barriers/Preferences:  Learning Barriers/Preferences - 03/05/23 0913       Learning Barriers/Preferences   Learning Barriers None    Learning Preferences None             General Pulmonary  Education Topics:  Infection Prevention: - Provides verbal and written material to individual with discussion of infection control including proper hand washing and proper equipment cleaning during exercise session. Flowsheet Row Pulmonary Rehab from 03/12/2023 in [facility] Cardiac and Pulmonary Rehab  Date 03/05/23  Educator  [facility]  Instruction Review Code 1- Verbalizes Understanding       Falls Prevention: - Provides verbal and written material to individual with discussion of falls prevention and safety. Flowsheet Row Pulmonary Rehab from 03/12/2023 in [facility] Cardiac and Pulmonary Rehab  Date 03/05/23  Educator [facility]  Instruction Review Code 1- Verbalizes Understanding       Chronic Lung Disease Review: - Group verbal instruction with posters, models, PowerPoint presentations and videos,  to review new updates, new respiratory medications, new advancements in procedures and treatments. Providing information on websites and "800" numbers for continued self-education. Includes information about supplement oxygen, available portable oxygen systems, continuous and intermittent flow rates, oxygen safety, concentrators, and Medicare reimbursement for oxygen. Explanation of Pulmonary Drugs, including class, frequency, complications, importance of spacers, rinsing mouth after steroid MDI's, and proper cleaning methods for nebulizers. Review of basic lung anatomy and physiology related to function, structure, and complications of lung disease. Review of risk factors. Discussion about methods for diagnosing sleep apnea and types of masks and machines for OSA. Includes a review of the use of types of environmental controls: home humidity, furnaces, filters, dust mite/pet prevention, HEPA vacuums. Discussion about weather changes, air quality and the benefits of nasal washing. Instruction on Warning signs, infection symptoms, calling MD promptly, preventive modes, and value of vaccinations. Review of effective airway  clearance, coughing and/or vibration techniques. Emphasizing that all should Create an Action Plan. Written material given at graduation.   AED/CPR: - Group verbal and written instruction with the use of models to demonstrate the basic use of the AED with the basic ABC's of resuscitation.    Anatomy and Cardiac Procedures: - Group verbal and visual presentation and models provide information about basic cardiac anatomy and function. Reviews the testing methods done to diagnose heart disease and the outcomes of the test results. Describes the treatment choices: Medical Management, Angioplasty, or Coronary Bypass Surgery for treating various heart conditions including Myocardial Infarction, Angina, Valve Disease, and Cardiac Arrhythmias.  Written material given at graduation. Flowsheet Row Pulmonary Rehab from 03/12/2023 in [facility] Cardiac and Pulmonary Rehab  Education need identified 03/12/23       Medication Safety: - Group verbal and visual instruction to review commonly prescribed medications for heart and lung disease. Reviews the medication, class of the drug, and side effects. Includes the steps to properly store meds and maintain the prescription regimen.  Written material given at graduation.   Other: -Provides group and verbal instruction on various topics (see comments)   Knowledge Questionnaire Score:  Knowledge Questionnaire Score - 03/12/23 1051       Knowledge Questionnaire Score   Pre Score 19/26              Core Components/Risk Factors/Patient Goals at Admission:  Personal Goals and Risk Factors at Admission - 03/05/23 0913       Core Components/Risk Factors/Patient Goals on Admission    Weight Management Yes;Weight Loss    Intervention Weight Management: Develop a combined nutrition and exercise program designed to reach desired caloric intake, while maintaining appropriate intake of nutrient and fiber, sodium and fats, and appropriate energy expenditure  required for the weight goal.;Weight Management: Provide education and appropriate resources to help participant work on and attain dietary goals.;Weight Management/Obesity: Establish reasonable short term and long term weight goals.    Expected Outcomes Short Term: Continue to assess and modify interventions until short term weight is achieved;Long Term: Adherence to nutrition and physical activity/exercise program aimed toward attainment  of established weight goal;Understanding recommendations for meals to include 15-35% energy as protein, 25-35% energy from fat, 35-60% energy from carbohydrates, less than 200mg  of dietary cholesterol, 20-35 gm of total fiber daily;Weight Loss: Understanding of general recommendations for a balanced deficit meal plan, which promotes 1-2 lb weight loss per week and includes a negative energy balance of [phone] kcal/d;Understanding of distribution of calorie intake throughout the day with the consumption of 4-5 meals/snacks    Improve shortness of breath with ADL's Yes    Intervention Provide education, individualized exercise plan and daily activity instruction to help decrease symptoms of SOB with activities of daily living.    Expected Outcomes Short Term: Improve cardiorespiratory fitness to achieve a reduction of symptoms when performing ADLs;Long Term: Be able to perform more ADLs without symptoms or delay the onset of symptoms    Diabetes Yes    Intervention Provide education about signs/symptoms and action to take for hypo/hyperglycemia.;Provide education about proper nutrition, including hydration, and aerobic/resistive exercise prescription along with prescribed medications to achieve blood glucose in normal ranges: Fasting glucose 65-99 mg/dL    Expected Outcomes Short Term: Participant verbalizes understanding of the signs/symptoms and immediate care of hyper/hypoglycemia, proper foot care and importance of medication, aerobic/resistive exercise and nutrition  plan for blood glucose control.;Long Term: Attainment of HbA1C < 7%.    Heart Failure Yes    Intervention Provide a combined exercise and nutrition program that is supplemented with education, support and counseling about heart failure. Directed toward relieving symptoms such as shortness of breath, decreased exercise tolerance, and extremity edema.    Expected Outcomes Improve functional capacity of life;Short term: Attendance in program 2-3 days a week with increased exercise capacity. Reported lower sodium intake. Reported increased fruit and vegetable intake. Reports medication compliance.;Short term: Daily weights obtained and reported for increase. Utilizing diuretic protocols set by physician.;Long term: Adoption of self-care skills and reduction of barriers for early signs and symptoms recognition and intervention leading to self-care maintenance.    Hypertension Yes    Intervention Provide education on lifestyle modifcations including regular physical activity/exercise, weight management, moderate sodium restriction and increased consumption of fresh fruit, vegetables, and low fat dairy, alcohol moderation, and smoking cessation.;Monitor prescription use compliance.    Expected Outcomes Short Term: Continued assessment and intervention until BP is < 140/46mm HG in hypertensive participants. < 130/43mm HG in hypertensive participants with diabetes, heart failure or chronic kidney disease.;Long Term: Maintenance of blood pressure at goal levels.    Lipids Yes    Intervention Provide education and support for participant on nutrition & aerobic/resistive exercise along with prescribed medications to achieve LDL 70mg , HDL >40mg .    Expected Outcomes Short Term: Participant states understanding of desired cholesterol values and is compliant with medications prescribed. Participant is following exercise prescription and nutrition guidelines.;Long Term: Cholesterol controlled with medications as  prescribed, with individualized exercise RX and with personalized nutrition plan. Value goals: LDL < 70mg , HDL > 40 mg.             Education:Diabetes - Individual verbal and written instruction to review signs/symptoms of diabetes, desired ranges of glucose level fasting, after meals and with exercise. Acknowledge that pre and post exercise glucose checks will be done for 3 sessions at entry of program. Flowsheet Row Pulmonary Rehab from 03/12/2023 in [facility] Cardiac and Pulmonary Rehab  Date 03/05/23  Educator [facility]  Instruction Review Code 1- Verbalizes Understanding       Know Your Numbers and Heart Failure: -  Group verbal and visual instruction to discuss disease risk factors for cardiac and pulmonary disease and treatment options.  Reviews associated critical values for Overweight/Obesity, Hypertension, Cholesterol, and Diabetes.  Discusses basics of heart failure: signs/symptoms and treatments.  Introduces Heart Failure Zone chart for action plan for heart failure.  Written material given at graduation. Flowsheet Row Pulmonary Rehab from 03/12/2023 in [facility] Cardiac and Pulmonary Rehab  Education need identified 03/12/23       Core Components/Risk Factors/Patient Goals Review:   Goals and Risk Factor Review     Row Name 04/02/23 9811 05/05/23 0933           Core Components/Risk Factors/Patient Goals Review   Personal Goals Review Improve shortness of breath with ADL's Improve shortness of breath with ADL's;Hypertension      Review Spoke to patient about their shortness of breath and what they can do to improve. Patient has been informed of breathing techniques when starting the program. Patient is informed to tell staff if they have had any med changes and that certain meds they are taking or not taking can be causing shortness of breath. Spoke to patient about her SOB and what they can do to improve. Patient has been informed of breathing techniques when starting the program. She  also states that she does own a BP cuff but has not been checking her pressures regularly. She did report that her BP has been within normal ranges when she does check it. She also is informed to tell staff about any med changes.      Expected Outcomes Short: Attend [facility] regularly to improve shortness of breath with ADL's. Long: maintain independence with ADL's Short: Attend [facility] regularly to improve shortness of breath with ADL's. Long: maintain independence with ADL's               Core Components/Risk Factors/Patient Goals at Discharge (Final Review):   Goals and Risk Factor Review - 05/05/23 0933       Core Components/Risk Factors/Patient Goals Review   Personal Goals Review Improve shortness of breath with ADL's;Hypertension    Review Spoke to patient about her SOB and what they can do to improve. Patient has been informed of breathing techniques when starting the program. She also states that she does own a BP cuff but has not been checking her pressures regularly. She did report that her BP has been within normal ranges when she does check it. She also is informed to tell staff about any med changes.    Expected Outcomes Short: Attend [facility] regularly to improve shortness of breath with ADL's. Long: maintain independence with ADL's             ITP Comments:  ITP Comments     Row Name 03/05/23 0914 03/12/23 1027 03/18/23 1235 03/24/23 0956 04/15/23 0848   ITP Comments Virtual Visit completed. Patient informed on EP and RD appointment and 6 Minute walk test. Patient also informed of patient health questionnaires on My Chart. Patient Verbalizes understanding. Visit diagnosis can be found in [facility] 02/03/2023. Completed and gym orientation. Initial ITP created and sent for review to Dr. Jinny Sanders, Medical Director. 30 Day review completed. Medical Director ITP review done, changes made as directed, and signed approval by Medical Director.   new to program First full  day of exercise!  Patient was oriented to gym and equipment including functions, settings, policies, and procedures.  Patient's individual exercise prescription and treatment plan were reviewed.  All starting workloads  were established based on the results of the 6 minute walk test done at initial orientation visit.  The plan for exercise progression was also introduced and progression will be customized based on patient's performance and goals. 30 Day review completed. Medical Director ITP review done, changes made as directed, and signed approval by Medical Director.    new to program    Row Name 05/06/23 0925           ITP Comments 30 Day review completed. Medical Director ITP review done, changes made as directed, and signed approval by Medical Director.                Comments:

## 2023-05-07 ENCOUNTER — Encounter: Payer: Medicare HMO | Admitting: *Deleted

## 2023-05-07 DIAGNOSIS — I5022 Chronic systolic (congestive) heart failure: Secondary | ICD-10-CM

## 2023-05-07 NOTE — Progress Notes (Signed)
Daily Session Note  Patient Details  Name: Mckenzie Wright MRN: [id number] Date of Birth: 01-30-1946 Referring Provider:   Doristine Devoid Pulmonary Rehab from 03/12/2023 in [facility] Cardiac and Pulmonary Rehab  Referring Provider Dr. Dorothyann Peng, MD       Encounter Date: 05/07/2023  Check In:  Session Check In - 05/07/23 1011       Check-In   Supervising physician immediately available to respond to emergencies See telemetry face sheet for immediately available ER MD    Location [facility]-Cardiac & Pulmonary Rehab    Staff Present Ronette Deter, BS, Exercise Physiologist;Joseph Ector, RCP,RRT,BSRT;Maxon Rosanky BS, , Exercise Physiologist;Deitra Craine Katrinka Blazing, RN, ADN    Virtual Visit No    Medication changes reported     No    Fall or balance concerns reported    No    Warm-up and Cool-down Performed on first and last piece of equipment    Resistance Training Performed Yes    VAD Patient? No    PAD/SET Patient? No      Pain Assessment   Currently in Pain? No/denies                Social History   Tobacco Use  Smoking Status Never  Smokeless Tobacco Never    Goals Met:  Independence with exercise equipment Exercise tolerated well No report of concerns or symptoms today Strength training completed today  Goals Unmet:  Not Applicable  Comments: Pt able to follow exercise prescription today without complaint.  Will continue to monitor for progression.    Dr. Bethann Punches is Medical Director for [facility] Cardiac Rehabilitation.  Dr. Vida Rigger is Medical Director for [facility] Pulmonary Rehabilitation.

## 2023-05-26 DIAGNOSIS — E7800 Pure hypercholesterolemia, unspecified: Secondary | ICD-10-CM | POA: Diagnosis not present

## 2023-05-26 DIAGNOSIS — I700 Atherosclerosis of aorta: Secondary | ICD-10-CM | POA: Diagnosis not present

## 2023-05-26 DIAGNOSIS — I25119 Atherosclerotic heart disease of native coronary artery with unspecified angina pectoris: Secondary | ICD-10-CM | POA: Diagnosis not present

## 2023-05-26 DIAGNOSIS — I10 Essential (primary) hypertension: Secondary | ICD-10-CM | POA: Diagnosis not present

## 2023-05-27 ENCOUNTER — Encounter: Payer: Self-pay | Admitting: *Deleted

## 2023-05-27 DIAGNOSIS — I5022 Chronic systolic (congestive) heart failure: Secondary | ICD-10-CM

## 2023-05-27 NOTE — Progress Notes (Signed)
Discharge Summary:  Mckenzie Wright  (DOB: 30-Apr-1946)  Bonita Quin requested to discharge early from pulmonary rehab for personal reasons. She completed 14/36 sessions.    6 Minute Walk     Row Name 03/12/23 1029         6 Minute Walk   Phase Initial     Distance 755 feet     Walk Time 5.67 minutes     # of Rest Breaks 1     MPH 1.51     METS 1.31     RPE 12     Perceived Dyspnea  0     VO2 Peak 4.59     Symptoms No     Resting HR 69 bpm     Resting BP 112/54     Resting Oxygen Saturation  94 %     Exercise Oxygen Saturation  during 6 min walk 91 %     Max Ex. HR 87 bpm     Max Ex. BP 126/50     2 Minute Post BP 110/56       Interval HR   1 Minute HR 81     2 Minute HR 82     3 Minute HR 86     4 Minute HR 85     5 Minute HR 85     6 Minute HR 87     2 Minute Post HR 69     Interval Heart Rate? Yes       Interval Oxygen   Interval Oxygen? Yes     Baseline Oxygen Saturation % 94 %     1 Minute Oxygen Saturation % 92 %     1 Minute Liters of Oxygen 0 L  RA     2 Minute Oxygen Saturation % 92 %     2 Minute Liters of Oxygen 0 L     3 Minute Oxygen Saturation % 94 %     3 Minute Liters of Oxygen 0 L     4 Minute Oxygen Saturation % 97 %     4 Minute Liters of Oxygen 0 L     5 Minute Oxygen Saturation % 91 %     5 Minute Liters of Oxygen 0 L     6 Minute Oxygen Saturation % 93 %     6 Minute Liters of Oxygen 0 L     2 Minute Post Oxygen Saturation % 95 %     2 Minute Post Liters of Oxygen 0 L

## 2023-05-27 NOTE — Progress Notes (Signed)
Pulmonary Individual Treatment Plan  Patient Details  Name: Mckenzie Wright MRN: [id number] Date of Birth: 09/11/1945 Referring Provider:   Doristine Devoid Pulmonary Rehab from 03/12/2023 in [facility] Cardiac and Pulmonary Rehab  Referring Provider Dr. Dorothyann Peng, MD       Initial Encounter Date:  Flowsheet Row Pulmonary Rehab from 03/12/2023 in [facility] Cardiac and Pulmonary Rehab  Date 03/12/23       Visit Diagnosis: Heart failure, chronic systolic (HCC)  Patient's Home Medications on Admission:  Current Outpatient Medications:    aspirin EC 81 MG tablet, Take 81 mg by mouth daily., Disp: , Rfl:    Calcium Carbonate-Vitamin D 600-200 MG-UNIT TABS, Take by mouth., Disp: , Rfl:    heparin 59563 UT/250ML infusion, Inject 1,200 Units/hr into the vein continuous. (Patient not taking: Reported on 03/05/2023), Disp: , Rfl:    insulin NPH-regular Human (70-30) 100 UNIT/ML injection, Inject 50 Units into the skin daily with breakfast., Disp: , Rfl:    insulin NPH-regular Human (70-30) 100 UNIT/ML injection, Inject 25 Units into the skin daily with supper. (Patient not taking: Reported on 03/05/2023), Disp: , Rfl:    levothyroxine (SYNTHROID) 88 MCG tablet, Take by mouth., Disp: , Rfl:    metoprolol tartrate (LOPRESSOR) 25 MG tablet, Take 0.5 tablets (12.5 mg total) by mouth 2 (two) times daily., Disp: , Rfl:    rosuvastatin (CRESTOR) 20 MG tablet, Take 1 tablet (20 mg total) by mouth daily at 6 (six) AM., Disp: , Rfl:    torsemide (DEMADEX) 20 MG tablet, TAKE 1 TABLET BY MOUTH ONCE DAILY IN THE MORNING AS NEEDED FOR EDEMA IN LEGS, Disp: , Rfl:    vitamin B-12 (CYANOCOBALAMIN) 1000 MCG tablet, Take 1,000 mcg by mouth daily., Disp: , Rfl:   Past Medical History: Past Medical History:  Diagnosis Date   Asthma    Diabetes (HCC)    Hypertension    Stroke (HCC)     Tobacco Use: Social History   Tobacco Use  Smoking Status Never  Smokeless Tobacco Never    Labs: Review Flowsheet        Latest Ref Rng & Units 03/16/2021 03/17/2021  Labs for ITP Cardiac and Pulmonary Rehab  Cholestrol 0 - 200 mg/dL - 875   LDL (calc) 0 - 99 mg/dL - 64   HDL-C >64 mg/dL - 41   Trlycerides <332 mg/dL - 951   Hemoglobin O8C 4.8 - 5.6 % 9.2  -  Bicarbonate 20.0 - 28.0 mmol/L 30.2  -  O2 Saturation % 43.2  -    Details             Pulmonary Assessment Scores:  Pulmonary Assessment Scores     Row Name 03/12/23 1048         ADL UCSD   ADL Phase Entry     SOB Score total 32     Rest 0     Walk 2     Stairs 2     Bath 0     Dress 0     Shop 2       CAT Score   CAT Score 6       mMRC Score   mMRC Score 1              UCSD: Self-administered rating of dyspnea associated with activities of daily living (ADLs) 6-point scale (0 = "not at all" to 5 = "maximal or unable to do because of breathlessness")  Scoring Scores range from  0 to 120.  Minimally important difference is 5 units  CAT: CAT can identify the health impairment of COPD patients and is better correlated with disease progression.  CAT has a scoring range of zero to 40. The CAT score is classified into four groups of low (less than 10), medium (10 - 20), high (21-30) and very high (31-40) based on the impact level of disease on health status. A CAT score over 10 suggests significant symptoms.  A worsening CAT score could be explained by an exacerbation, poor medication adherence, poor inhaler technique, or progression of COPD or comorbid conditions.  CAT MCID is 2 points  mMRC: mMRC (Modified Medical Research Council) Dyspnea Scale is used to assess the degree of baseline functional disability in patients of respiratory disease due to dyspnea. No minimal important difference is established. A decrease in score of 1 point or greater is considered a positive change.   Pulmonary Function Assessment:  Pulmonary Function Assessment - 03/05/23 0912       Breath   Shortness of Breath No             Exercise  Target Goals: Exercise Program Goal: Individual exercise prescription set using results from initial 6 min walk test and THRR while considering  patient's activity barriers and safety.   Exercise Prescription Goal: Initial exercise prescription builds to 30-45 minutes a day of aerobic activity, 2-3 days per week.  Home exercise guidelines will be given to patient during program as part of exercise prescription that the participant will acknowledge.  Education: Aerobic Exercise: - Group verbal and visual presentation on the components of exercise prescription. Introduces F.I.T.T principle from ACSM for exercise prescriptions.  Reviews F.I.T.T. principles of aerobic exercise including progression. Written material given at graduation. Flowsheet Row Pulmonary Rehab from 03/12/2023 in [facility] Cardiac and Pulmonary Rehab  Education need identified 03/12/23       Education: Resistance Exercise: - Group verbal and visual presentation on the components of exercise prescription. Introduces F.I.T.T principle from ACSM for exercise prescriptions  Reviews F.I.T.T. principles of resistance exercise including progression. Written material given at graduation.    Education: Exercise & Equipment Safety: - Individual verbal instruction and demonstration of equipment use and safety with use of the equipment. Flowsheet Row Pulmonary Rehab from 03/12/2023 in [facility] Cardiac and Pulmonary Rehab  Date 03/05/23  Educator [facility]  Instruction Review Code 1- Verbalizes Understanding       Education: Exercise Physiology & General Exercise Guidelines: - Group verbal and written instruction with models to review the exercise physiology of the cardiovascular system and associated critical values. Provides general exercise guidelines with specific guidelines to those with heart or lung disease.    Education: Flexibility, Balance, Mind/Body Relaxation: - Group verbal and visual presentation with interactive activity on the  components of exercise prescription. Introduces F.I.T.T principle from ACSM for exercise prescriptions. Reviews F.I.T.T. principles of flexibility and balance exercise training including progression. Also discusses the mind body connection.  Reviews various relaxation techniques to help reduce and manage stress (i.e. Deep breathing, progressive muscle relaxation, and visualization). Balance handout provided to take home. Written material given at graduation.   Activity Barriers & Risk Stratification:  Activity Barriers & Cardiac Risk Stratification - 03/12/23 1034       Activity Barriers & Cardiac Risk Stratification   Activity Barriers Deconditioning;Muscular Weakness;Balance Concerns;Other (comment)    Comments Hx of hip surgery    Cardiac Risk Stratification High  6 Minute Walk:  6 Minute Walk     Row Name 03/12/23 1029         6 Minute Walk   Phase Initial     Distance 755 feet     Walk Time 5.67 minutes     # of Rest Breaks 1     MPH 1.51     METS 1.31     RPE 12     Perceived Dyspnea  0     VO2 Peak 4.59     Symptoms No     Resting HR 69 bpm     Resting BP 112/54     Resting Oxygen Saturation  94 %     Exercise Oxygen Saturation  during 6 min walk 91 %     Max Ex. HR 87 bpm     Max Ex. BP 126/50     2 Minute Post BP 110/56       Interval HR   1 Minute HR 81     2 Minute HR 82     3 Minute HR 86     4 Minute HR 85     5 Minute HR 85     6 Minute HR 87     2 Minute Post HR 69     Interval Heart Rate? Yes       Interval Oxygen   Interval Oxygen? Yes     Baseline Oxygen Saturation % 94 %     1 Minute Oxygen Saturation % 92 %     1 Minute Liters of Oxygen 0 L  RA     2 Minute Oxygen Saturation % 92 %     2 Minute Liters of Oxygen 0 L     3 Minute Oxygen Saturation % 94 %     3 Minute Liters of Oxygen 0 L     4 Minute Oxygen Saturation % 97 %     4 Minute Liters of Oxygen 0 L     5 Minute Oxygen Saturation % 91 %     5 Minute Liters of  Oxygen 0 L     6 Minute Oxygen Saturation % 93 %     6 Minute Liters of Oxygen 0 L     2 Minute Post Oxygen Saturation % 95 %     2 Minute Post Liters of Oxygen 0 L             Oxygen Initial Assessment:  Oxygen Initial Assessment - 03/05/23 0912       Home Oxygen   Home Oxygen Device None    Sleep Oxygen Prescription None    Home Exercise Oxygen Prescription None    Home Resting Oxygen Prescription None      Initial 6 min Walk   Oxygen Used None      Program Oxygen Prescription   Program Oxygen Prescription None      Intervention   Short Term Goals To learn and exhibit compliance with exercise, home and travel O2 prescription;To learn and understand importance of monitoring SPO2 with pulse oximeter and demonstrate accurate use of the pulse oximeter.;To learn and understand importance of maintaining oxygen saturations>88%;To learn and demonstrate proper pursed lip breathing techniques or other breathing techniques.     Long  Term Goals Exhibits compliance with exercise, home  and travel O2 prescription;Verbalizes importance of monitoring SPO2 with pulse oximeter and return demonstration;Maintenance of O2 saturations>88%;Exhibits proper breathing techniques, such as pursed lip breathing or other  method taught during program session;Compliance with respiratory medication             Oxygen Re-Evaluation:  Oxygen Re-Evaluation     Row Name 03/24/23 0956 04/02/23 0951 05/05/23 0943         Program Oxygen Prescription   Program Oxygen Prescription None None None       Home Oxygen   Home Oxygen Device None None None     Sleep Oxygen Prescription None None None     Home Exercise Oxygen Prescription None None None     Home Resting Oxygen Prescription None None None     Compliance with Home Oxygen Use Yes -- Yes       Goals/Expected Outcomes   Short Term Goals To learn and demonstrate proper pursed lip breathing techniques or other breathing techniques.  To learn and  demonstrate proper pursed lip breathing techniques or other breathing techniques.  To learn and demonstrate proper pursed lip breathing techniques or other breathing techniques.      Long  Term Goals Exhibits proper breathing techniques, such as pursed lip breathing or other method taught during program session Exhibits proper breathing techniques, such as pursed lip breathing or other method taught during program session Exhibits proper breathing techniques, such as pursed lip breathing or other method taught during program session     Comments Reviewed PLB technique with pt.  Talked about how it works and it's importance in maintaining their exercise saturations. Informed patient how to perform the Pursed Lipped breathing technique. Told patient to Inhale through the nose and out the mouth with pursed lips to keep their airways open, help oxygenate them better, practice when at rest or doing strenuous activity. Patient Verbalizes understanding of technique and will work on and be reiterated during [facility]. Reviewed PLB technique with patient. We encouraged her to practice PLB during exercise. Pt expressed understanding.     Goals/Expected Outcomes Short: Become more profiecient at using PLB. Long: Become independent at using PLB. Short: use PLB with exertion. Long: use PLB on exertion proficiently and independently. Short: use PLB with exertion. Long: use PLB on exertion proficiently and independently.              Oxygen Discharge (Final Oxygen Re-Evaluation):  Oxygen Re-Evaluation - 05/05/23 0943       Program Oxygen Prescription   Program Oxygen Prescription None      Home Oxygen   Home Oxygen Device None    Sleep Oxygen Prescription None    Home Exercise Oxygen Prescription None    Home Resting Oxygen Prescription None    Compliance with Home Oxygen Use Yes      Goals/Expected Outcomes   Short Term Goals To learn and demonstrate proper pursed lip breathing techniques or other  breathing techniques.     Long  Term Goals Exhibits proper breathing techniques, such as pursed lip breathing or other method taught during program session    Comments Reviewed PLB technique with patient. We encouraged her to practice PLB during exercise. Pt expressed understanding.    Goals/Expected Outcomes Short: use PLB with exertion. Long: use PLB on exertion proficiently and independently.             Initial Exercise Prescription:  Initial Exercise Prescription - 03/12/23 1000       Date of Initial Exercise RX and Referring Provider   Date 03/12/23    Referring Provider Dr. Dorothyann Peng, MD      Oxygen   Maintain Oxygen Saturation  88% or higher      Recumbant Bike   Level 1    RPM 50    Watts 15    Minutes 15    METs 1.31      NuStep   Level 1    SPM 80    Minutes 15    METs 1.31      Biostep-RELP   Level 1    SPM 50    Minutes 15    METs 1.31      Track   Laps 12    Minutes 15    METs 1.65      Prescription Details   Frequency (times per week) 2    Duration Progress to 30 minutes of continuous aerobic without signs/symptoms of physical distress      Intensity   THRR 40-80% of Max Heartrate 97-126    Ratings of Perceived Exertion 11-13    Perceived Dyspnea 0-4      Progression   Progression Continue to progress workloads to maintain intensity without signs/symptoms of physical distress.      Resistance Training   Training Prescription Yes    Weight 2 lb    Reps 10-15             Perform Capillary Blood Glucose checks as needed.  Exercise Prescription Changes:   Exercise Prescription Changes     Row Name 03/12/23 1000 04/02/23 1600 04/15/23 1100 05/12/23 1500       Response to Exercise   Blood Pressure (Admit) 112/54 110/58 114/58 100/58    Blood Pressure (Exercise) 126/50 128/64 136/66 --    Blood Pressure (Exit) 110/56 106/58 116/62 108/60    Heart Rate (Admit) 69 bpm 76 bpm 77 bpm 69 bpm    Heart Rate (Exercise) 87 bpm 105  bpm 109 bpm 101 bpm    Heart Rate (Exit) 69 bpm 81 bpm 94 bpm 90 bpm    Oxygen Saturation (Admit) 94 % 94 % 92 % 91 %    Oxygen Saturation (Exercise) 91 % 89 % 91 % 91 %    Oxygen Saturation (Exit) 95 % 96 % 94 % 95 %    Rating of Perceived Exertion (Exercise) 12 15 15 15     Perceived Dyspnea (Exercise) 0 0 2 1    Symptoms none none none none    Comments Results First two days of exercise -- --    Duration -- Progress to 30 minutes of  aerobic without signs/symptoms of physical distress Progress to 30 minutes of  aerobic without signs/symptoms of physical distress Progress to 30 minutes of  aerobic without signs/symptoms of physical distress    Intensity -- THRR unchanged THRR unchanged THRR unchanged      Progression   Progression -- Continue to progress workloads to maintain intensity without signs/symptoms of physical distress. Continue to progress workloads to maintain intensity without signs/symptoms of physical distress. Continue to progress workloads to maintain intensity without signs/symptoms of physical distress.    Average METs -- 1.83 2.17 2.29      Resistance Training   Training Prescription -- Yes Yes Yes    Weight -- 2 lb 2 lb 2 lb    Reps -- 10-15 10-15 10-15      Interval Training   Interval Training -- No No No      NuStep   Level -- 2 2 2     Minutes -- 15 15 15     METs -- 2.5 2 2.3  Biostep-RELP   Level -- -- -- 1    Minutes -- -- -- 15    METs -- -- -- 2      Track   Laps -- 10 26 30     Minutes -- 15 15 15     METs -- 1.54 2.41 2.63      Oxygen   Maintain Oxygen Saturation -- 88% or higher 88% or higher 88% or higher             Exercise Comments:   Exercise Comments     Row Name 03/24/23 0955           Exercise Comments First full day of exercise!  Patient was oriented to gym and equipment including functions, settings, policies, and procedures.  Patient's individual exercise prescription and treatment plan were reviewed.  All  starting workloads were established based on the results of the 6 minute walk test done at initial orientation visit.  The plan for exercise progression was also introduced and progression will be customized based on patient's performance and goals.                Exercise Goals and Review:   Exercise Goals     Row Name 03/12/23 1035             Exercise Goals   Increase Physical Activity Yes       Intervention Provide advice, education, support and counseling about physical activity/exercise needs.;Develop an individualized exercise prescription for aerobic and resistive training based on initial evaluation findings, risk stratification, comorbidities and participant's personal goals.       Expected Outcomes Long Term: Add in home exercise to make exercise part of routine and to increase amount of physical activity.;Short Term: Attend rehab on a regular basis to increase amount of physical activity.;Long Term: Exercising regularly at least 3-5 days a week.       Increase Strength and Stamina Yes       Intervention Provide advice, education, support and counseling about physical activity/exercise needs.;Develop an individualized exercise prescription for aerobic and resistive training based on initial evaluation findings, risk stratification, comorbidities and participant's personal goals.       Expected Outcomes Short Term: Increase workloads from initial exercise prescription for resistance, speed, and METs.;Short Term: Perform resistance training exercises routinely during rehab and add in resistance training at home;Long Term: Improve cardiorespiratory fitness, muscular endurance and strength as measured by increased METs and functional capacity ( )       Able to understand and use rate of perceived exertion (RPE) scale Yes       Intervention Provide education and explanation on how to use RPE scale       Expected Outcomes Short Term: Able to use RPE daily in rehab to express  subjective intensity level;Long Term:  Able to use RPE to guide intensity level when exercising independently       Able to understand and use Dyspnea scale Yes       Intervention Provide education and explanation on how to use Dyspnea scale       Expected Outcomes Long Term: Able to use Dyspnea scale to guide intensity level when exercising independently;Short Term: Able to use Dyspnea scale daily in rehab to express subjective sense of shortness of breath during exertion       Knowledge and understanding of Target Heart Rate Range (THRR) Yes       Intervention Provide education and explanation of THRR including how the numbers were  predicted and where they are located for reference       Expected Outcomes Short Term: Able to state/look up THRR;Long Term: Able to use THRR to govern intensity when exercising independently;Short Term: Able to use daily as guideline for intensity in rehab       Able to check pulse independently Yes       Intervention Provide education and demonstration on how to check pulse in carotid and radial arteries.;Review the importance of being able to check your own pulse for safety during independent exercise       Expected Outcomes Short Term: Able to explain why pulse checking is important during independent exercise;Long Term: Able to check pulse independently and accurately       Understanding of Exercise Prescription Yes       Intervention Provide education, explanation, and written materials on patient's individual exercise prescription       Expected Outcomes Short Term: Able to explain program exercise prescription;Long Term: Able to explain home exercise prescription to exercise independently                Exercise Goals Re-Evaluation :  Exercise Goals Re-Evaluation     Row Name 03/24/23 0954 04/02/23 1620 04/15/23 1123 05/12/23 1507       Exercise Goal Re-Evaluation   Exercise Goals Review Knowledge and understanding of Target Heart Rate Range  (THRR);Able to understand and use rate of perceived exertion (RPE) scale;Able to understand and use Dyspnea scale;Understanding of Exercise Prescription Increase Physical Activity;Increase Strength and Stamina;Understanding of Exercise Prescription Increase Physical Activity;Increase Strength and Stamina;Understanding of Exercise Prescription Increase Physical Activity;Increase Strength and Stamina;Understanding of Exercise Prescription    Comments Reviewed RPE and dyspnea scale, THR and program prescription with pt today.  Pt voiced understanding and was given a copy of goals to take home. Mckenzie Wright is off to a good start in the program. She did well walking the track and walked up to 10 laps. She also did well on the T4 nustep and improved to level 2. She also did well with 2 lb hand weights for resistance training. We will continue to monitor her progress in the program. Mckenzie Wright is doing well in rehab. She recently walked 25 laps on the track after previously only walking 10 laps! She also has continued to work at level 2 on the T4 nustep and use 2 lb hand weights for resistance training. We will continue to monitor her progress in the program. Mckenzie Wright continues to do well in rehab. She recently increased her laps on the track from 26 to 30 laps. She also continues to work at level 2 on the T4 nustep and level 1 on the biostep. She also continues to use 2 lb handweights for resistance training. We will continue to monitor his progress in the program.    Expected Outcomes Short: Use RPE daily to regulate intensity. Long: Follow program prescription in THR. Short: Continue to follow current exercise prescription. Long: Continue exercise to improve strength and stamina. Short: Increase to level 3 on the T4 nustep. Long: Continue exercise to improve strength and stamina. Short: Increase to level 3 on the T4 nustep. Long: Continue exercise to improve strength and stamina.             Discharge Exercise Prescription  (Final Exercise Prescription Changes):  Exercise Prescription Changes - 05/12/23 1500       Response to Exercise   Blood Pressure (Admit) 100/58    Blood Pressure (Exit) 108/60  Heart Rate (Admit) 69 bpm    Heart Rate (Exercise) 101 bpm    Heart Rate (Exit) 90 bpm    Oxygen Saturation (Admit) 91 %    Oxygen Saturation (Exercise) 91 %    Oxygen Saturation (Exit) 95 %    Rating of Perceived Exertion (Exercise) 15    Perceived Dyspnea (Exercise) 1    Symptoms none    Duration Progress to 30 minutes of  aerobic without signs/symptoms of physical distress    Intensity THRR unchanged      Progression   Progression Continue to progress workloads to maintain intensity without signs/symptoms of physical distress.    Average METs 2.29      Resistance Training   Training Prescription Yes    Weight 2 lb    Reps 10-15      Interval Training   Interval Training No      NuStep   Level 2    Minutes 15    METs 2.3      Biostep-RELP   Level 1    Minutes 15    METs 2      Track   Laps 30    Minutes 15    METs 2.63      Oxygen   Maintain Oxygen Saturation 88% or higher             Nutrition:  Target Goals: Understanding of nutrition guidelines, daily intake of sodium 1500mg , cholesterol 200mg , calories 30% from fat and 7% or less from saturated fats, daily to have 5 or more servings of fruits and vegetables.  Education: All About Nutrition: -Group instruction provided by verbal, written material, interactive activities, discussions, models, and posters to present general guidelines for heart healthy nutrition including fat, fiber, MyPlate, the role of sodium in heart healthy nutrition, utilization of the nutrition label, and utilization of this knowledge for meal planning. Follow up email sent as well. Written material given at graduation. Flowsheet Row Pulmonary Rehab from 03/12/2023 in [facility] Cardiac and Pulmonary Rehab  Education need identified 03/12/23        Biometrics:  Pre Biometrics - 03/12/23 1035       Pre Biometrics   Height 5\' 6"  (1.676 m)    Weight 166 lb 12.8 oz (75.7 kg)    Waist Circumference 35 inches    Hip Circumference 43 inches    Waist to Hip Ratio 0.81 %    BMI (Calculated) 26.94    Single Leg Stand 0 seconds              Nutrition Therapy Plan and Nutrition Goals:   Nutrition Assessments:  MEDIFICTS Score Key: >=70 Need to make dietary changes  40-70 Heart Healthy Diet <= 40 Therapeutic Level Cholesterol Diet  Flowsheet Row Pulmonary Rehab from 03/12/2023 in [facility] Cardiac and Pulmonary Rehab  Picture Your Plate Total Score on Admission 62      Picture Your Plate Scores: <40 Unhealthy dietary pattern with much room for improvement. 41-50 Dietary pattern unlikely to meet recommendations for good health and room for improvement. 51-60 More healthful dietary pattern, with some room for improvement.  >60 Healthy dietary pattern, although there may be some specific behaviors that could be improved.   Nutrition Goals Re-Evaluation:  Nutrition Goals Re-Evaluation     Row Name 04/02/23 0953 05/05/23 0940           Goals   Comment Patient was informed on why it is important to maintain a balanced diet when dealing  with Respiratory issues. Explained that it takes a lot of energy to breath and when they are short of breath often they will need to have a good diet to help keep up with the calories they are expending for breathing. Pt has not spoken with the RD at this time. She was previously informed of why it is important to mantain a balanced diet when dealing with respiratory issues.      Expected Outcome Short: Choose and plan snacks accordingly to patients caloric intake to improve breathing. Long: Maintain a diet independently that meets their caloric intake to aid in daily shortness of breath. Short: Choose and plan snacks accordingly to patients caloric intake to improve breathing. Long: Maintain a  diet independently that meets their caloric intake to aid in daily shortness of breath.               Nutrition Goals Discharge (Final Nutrition Goals Re-Evaluation):  Nutrition Goals Re-Evaluation - 05/05/23 0940       Goals   Comment Pt has not spoken with the RD at this time. She was previously informed of why it is important to mantain a balanced diet when dealing with respiratory issues.    Expected Outcome Short: Choose and plan snacks accordingly to patients caloric intake to improve breathing. Long: Maintain a diet independently that meets their caloric intake to aid in daily shortness of breath.             Psychosocial: Target Goals: Acknowledge presence or absence of significant depression and/or stress, maximize coping skills, provide positive support system. Participant is able to verbalize types and ability to use techniques and skills needed for reducing stress and depression.   Education: Stress, Anxiety, and Depression - Group verbal and visual presentation to define topics covered.  Reviews how body is impacted by stress, anxiety, and depression.  Also discusses healthy ways to reduce stress and to treat/manage anxiety and depression.  Written material given at graduation.   Education: Sleep Hygiene -Provides group verbal and written instruction about how sleep can affect your health.  Define sleep hygiene, discuss sleep cycles and impact of sleep habits. Review good sleep hygiene tips.    Initial Review & Psychosocial Screening:  Initial Psych Review & Screening - 03/05/23 0915       Initial Review   Current issues with None Identified      Family Dynamics   Good Support System? Yes    Comments She can look to her falimy for support. She can look to her husband, son and daughter for support.      Barriers   Psychosocial barriers to participate in program The patient should benefit from training in stress management and relaxation.      Screening  Interventions   Interventions To provide support and resources with identified psychosocial needs;Provide feedback about the scores to participant;Encouraged to exercise    Expected Outcomes Short Term goal: Utilizing psychosocial counselor, staff and physician to assist with identification of specific Stressors or current issues interfering with healing process. Setting desired goal for each stressor or current issue identified.;Long Term Goal: Stressors or current issues are controlled or eliminated.;Short Term goal: Identification and review with participant of any Quality of Life or Depression concerns found by scoring the questionnaire.;Long Term goal: The participant improves quality of Life and PHQ9 Scores as seen by post scores and/or verbalization of changes             Quality of Life Scores:  Scores  of 19 and below usually indicate a poorer quality of life in these areas.  A difference of  2-3 points is a clinically meaningful difference.  A difference of 2-3 points in the total score of the Quality of Life Index has been associated with significant improvement in overall quality of life, self-image, physical symptoms, and general health in studies assessing change in quality of life.  PHQ-9: Review Flowsheet       03/12/2023  Depression screen PHQ 2/9  Decreased Interest 0  Down, Depressed, Hopeless 0  PHQ - 2 Score 0  Altered sleeping 0  Tired, decreased energy 1  Change in appetite 0  Feeling bad or failure about yourself  0  Trouble concentrating 0  Moving slowly or fidgety/restless 0  Suicidal thoughts 0  PHQ-9 Score 1  Difficult doing work/chores Not difficult at all    Details           Interpretation of Total Score  Total Score Depression Severity:  1-4 = Minimal depression, 5-9 = Mild depression, 10-14 = Moderate depression, 15-19 = Moderately severe depression, 20-27 = Severe depression   Psychosocial Evaluation and Intervention:  Psychosocial  Evaluation - 03/05/23 0917       Psychosocial Evaluation & Interventions   Interventions Relaxation education;Stress management education;Encouraged to exercise with the program and follow exercise prescription    Comments She can look to her falimy for support. She can look to her husband, son and daughter for support.    Expected Outcomes Short: Start [facility] to help with mood. Long: Maintain a healthy mental state.    Continue Psychosocial Services  Follow up required by staff             Psychosocial Re-Evaluation:  Psychosocial Re-Evaluation     Row Name 04/02/23 0954 05/05/23 1610           Psychosocial Re-Evaluation   Current issues with None Identified Current Stress Concerns      Comments Patient reports no issues with their current mental states, sleep, stress, depression or anxiety. Will follow up with patient in a few weeks for any changes. Mckenzie Wright reports no major stressors at this time. She states that she does not enjoy exercise, but knows that is heping her in the long run. Her husband has been a good supports system for her but she likes being independent. She also reports sleeping well at this time.      Expected Outcomes Short: Continue to exercise regularly to support mental health and notify staff of any changes. Long: maintain mental health and well being through teaching of rehab or prescribed medications independently. Short: Continue to relieve stress through healthy avenues. Long: maintain positive outlook.      Interventions Encouraged to attend Pulmonary Rehabilitation for the exercise Encouraged to attend Pulmonary Rehabilitation for the exercise      Continue Psychosocial Services  Follow up required by staff Follow up required by staff               Psychosocial Discharge (Final Psychosocial Re-Evaluation):  Psychosocial Re-Evaluation - 05/05/23 0928       Psychosocial Re-Evaluation   Current issues with Current Stress Concerns    Comments  Mckenzie Wright reports no major stressors at this time. She states that she does not enjoy exercise, but knows that is heping her in the long run. Her husband has been a good supports system for her but she likes being independent. She also reports sleeping well at this time.  Expected Outcomes Short: Continue to relieve stress through healthy avenues. Long: maintain positive outlook.    Interventions Encouraged to attend Pulmonary Rehabilitation for the exercise    Continue Psychosocial Services  Follow up required by staff             Education: Education Goals: Education classes will be provided on a weekly basis, covering required topics. Participant will state understanding/return demonstration of topics presented.  Learning Barriers/Preferences:  Learning Barriers/Preferences - 03/05/23 0913       Learning Barriers/Preferences   Learning Barriers None    Learning Preferences None             General Pulmonary Education Topics:  Infection Prevention: - Provides verbal and written material to individual with discussion of infection control including proper hand washing and proper equipment cleaning during exercise session. Flowsheet Row Pulmonary Rehab from 03/12/2023 in [facility] Cardiac and Pulmonary Rehab  Date 03/05/23  Educator [facility]  Instruction Review Code 1- Verbalizes Understanding       Falls Prevention: - Provides verbal and written material to individual with discussion of falls prevention and safety. Flowsheet Row Pulmonary Rehab from 03/12/2023 in [facility] Cardiac and Pulmonary Rehab  Date 03/05/23  Educator [facility]  Instruction Review Code 1- Verbalizes Understanding       Chronic Lung Disease Review: - Group verbal instruction with posters, models, PowerPoint presentations and videos,  to review new updates, new respiratory medications, new advancements in procedures and treatments. Providing information on websites and "800" numbers for continued self-education. Includes  information about supplement oxygen, available portable oxygen systems, continuous and intermittent flow rates, oxygen safety, concentrators, and Medicare reimbursement for oxygen. Explanation of Pulmonary Drugs, including class, frequency, complications, importance of spacers, rinsing mouth after steroid MDI's, and proper cleaning methods for nebulizers. Review of basic lung anatomy and physiology related to function, structure, and complications of lung disease. Review of risk factors. Discussion about methods for diagnosing sleep apnea and types of masks and machines for OSA. Includes a review of the use of types of environmental controls: home humidity, furnaces, filters, dust mite/pet prevention, HEPA vacuums. Discussion about weather changes, air quality and the benefits of nasal washing. Instruction on Warning signs, infection symptoms, calling MD promptly, preventive modes, and value of vaccinations. Review of effective airway clearance, coughing and/or vibration techniques. Emphasizing that all should Create an Action Plan. Written material given at graduation.   AED/CPR: - Group verbal and written instruction with the use of models to demonstrate the basic use of the AED with the basic ABC's of resuscitation.    Anatomy and Cardiac Procedures: - Group verbal and visual presentation and models provide information about basic cardiac anatomy and function. Reviews the testing methods done to diagnose heart disease and the outcomes of the test results. Describes the treatment choices: Medical Management, Angioplasty, or Coronary Bypass Surgery for treating various heart conditions including Myocardial Infarction, Angina, Valve Disease, and Cardiac Arrhythmias.  Written material given at graduation. Flowsheet Row Pulmonary Rehab from 03/12/2023 in [facility] Cardiac and Pulmonary Rehab  Education need identified 03/12/23       Medication Safety: - Group verbal and visual instruction to review commonly  prescribed medications for heart and lung disease. Reviews the medication, class of the drug, and side effects. Includes the steps to properly store meds and maintain the prescription regimen.  Written material given at graduation.   Other: -Provides group and verbal instruction on various topics (see comments)   Knowledge Questionnaire Score:  Knowledge Questionnaire  Score - 03/12/23 1051       Knowledge Questionnaire Score   Pre Score 19/26              Core Components/Risk Factors/Patient Goals at Admission:  Personal Goals and Risk Factors at Admission - 03/05/23 0913       Core Components/Risk Factors/Patient Goals on Admission    Weight Management Yes;Weight Loss    Intervention Weight Management: Develop a combined nutrition and exercise program designed to reach desired caloric intake, while maintaining appropriate intake of nutrient and fiber, sodium and fats, and appropriate energy expenditure required for the weight goal.;Weight Management: Provide education and appropriate resources to help participant work on and attain dietary goals.;Weight Management/Obesity: Establish reasonable short term and long term weight goals.    Expected Outcomes Short Term: Continue to assess and modify interventions until short term weight is achieved;Long Term: Adherence to nutrition and physical activity/exercise program aimed toward attainment of established weight goal;Understanding recommendations for meals to include 15-35% energy as protein, 25-35% energy from fat, 35-60% energy from carbohydrates, less than 200mg  of dietary cholesterol, 20-35 gm of total fiber daily;Weight Loss: Understanding of general recommendations for a balanced deficit meal plan, which promotes 1-2 lb weight loss per week and includes a negative energy balance of [phone] kcal/d;Understanding of distribution of calorie intake throughout the day with the consumption of 4-5 meals/snacks    Improve shortness of breath  with ADL's Yes    Intervention Provide education, individualized exercise plan and daily activity instruction to help decrease symptoms of SOB with activities of daily living.    Expected Outcomes Short Term: Improve cardiorespiratory fitness to achieve a reduction of symptoms when performing ADLs;Long Term: Be able to perform more ADLs without symptoms or delay the onset of symptoms    Diabetes Yes    Intervention Provide education about signs/symptoms and action to take for hypo/hyperglycemia.;Provide education about proper nutrition, including hydration, and aerobic/resistive exercise prescription along with prescribed medications to achieve blood glucose in normal ranges: Fasting glucose 65-99 mg/dL    Expected Outcomes Short Term: Participant verbalizes understanding of the signs/symptoms and immediate care of hyper/hypoglycemia, proper foot care and importance of medication, aerobic/resistive exercise and nutrition plan for blood glucose control.;Long Term: Attainment of HbA1C < 7%.    Heart Failure Yes    Intervention Provide a combined exercise and nutrition program that is supplemented with education, support and counseling about heart failure. Directed toward relieving symptoms such as shortness of breath, decreased exercise tolerance, and extremity edema.    Expected Outcomes Improve functional capacity of life;Short term: Attendance in program 2-3 days a week with increased exercise capacity. Reported lower sodium intake. Reported increased fruit and vegetable intake. Reports medication compliance.;Short term: Daily weights obtained and reported for increase. Utilizing diuretic protocols set by physician.;Long term: Adoption of self-care skills and reduction of barriers for early signs and symptoms recognition and intervention leading to self-care maintenance.    Hypertension Yes    Intervention Provide education on lifestyle modifcations including regular physical activity/exercise, weight  management, moderate sodium restriction and increased consumption of fresh fruit, vegetables, and low fat dairy, alcohol moderation, and smoking cessation.;Monitor prescription use compliance.    Expected Outcomes Short Term: Continued assessment and intervention until BP is < 140/73mm HG in hypertensive participants. < 130/5mm HG in hypertensive participants with diabetes, heart failure or chronic kidney disease.;Long Term: Maintenance of blood pressure at goal levels.    Lipids Yes    Intervention Provide education and support  for participant on nutrition & aerobic/resistive exercise along with prescribed medications to achieve LDL 70mg , HDL >40mg .    Expected Outcomes Short Term: Participant states understanding of desired cholesterol values and is compliant with medications prescribed. Participant is following exercise prescription and nutrition guidelines.;Long Term: Cholesterol controlled with medications as prescribed, with individualized exercise RX and with personalized nutrition plan. Value goals: LDL < 70mg , HDL > 40 mg.             Education:Diabetes - Individual verbal and written instruction to review signs/symptoms of diabetes, desired ranges of glucose level fasting, after meals and with exercise. Acknowledge that pre and post exercise glucose checks will be done for 3 sessions at entry of program. Flowsheet Row Pulmonary Rehab from 03/12/2023 in [facility] Cardiac and Pulmonary Rehab  Date 03/05/23  Educator [facility]  Instruction Review Code 1- Verbalizes Understanding       Know Your Numbers and Heart Failure: - Group verbal and visual instruction to discuss disease risk factors for cardiac and pulmonary disease and treatment options.  Reviews associated critical values for Overweight/Obesity, Hypertension, Cholesterol, and Diabetes.  Discusses basics of heart failure: signs/symptoms and treatments.  Introduces Heart Failure Zone chart for action plan for heart failure.  Written  material given at graduation. Flowsheet Row Pulmonary Rehab from 03/12/2023 in [facility] Cardiac and Pulmonary Rehab  Education need identified 03/12/23       Core Components/Risk Factors/Patient Goals Review:   Goals and Risk Factor Review     Row Name 04/02/23 1914 05/05/23 0933           Core Components/Risk Factors/Patient Goals Review   Personal Goals Review Improve shortness of breath with ADL's Improve shortness of breath with ADL's;Hypertension      Review Spoke to patient about their shortness of breath and what they can do to improve. Patient has been informed of breathing techniques when starting the program. Patient is informed to tell staff if they have had any med changes and that certain meds they are taking or not taking can be causing shortness of breath. Spoke to patient about her SOB and what they can do to improve. Patient has been informed of breathing techniques when starting the program. She also states that she does own a BP cuff but has not been checking her pressures regularly. She did report that her BP has been within normal ranges when she does check it. She also is informed to tell staff about any med changes.      Expected Outcomes Short: Attend [facility] regularly to improve shortness of breath with ADL's. Long: maintain independence with ADL's Short: Attend [facility] regularly to improve shortness of breath with ADL's. Long: maintain independence with ADL's               Core Components/Risk Factors/Patient Goals at Discharge (Final Review):   Goals and Risk Factor Review - 05/05/23 0933       Core Components/Risk Factors/Patient Goals Review   Personal Goals Review Improve shortness of breath with ADL's;Hypertension    Review Spoke to patient about her SOB and what they can do to improve. Patient has been informed of breathing techniques when starting the program. She also states that she does own a BP cuff but has not been checking her pressures regularly.  She did report that her BP has been within normal ranges when she does check it. She also is informed to tell staff about any med changes.    Expected Outcomes Short: Attend [facility]  regularly to improve shortness of breath with ADL's. Long: maintain independence with ADL's             ITP Comments:  ITP Comments     Row Name 03/05/23 0914 03/12/23 1027 03/18/23 1235 03/24/23 0956 04/15/23 0848   ITP Comments Virtual Visit completed. Patient informed on EP and RD appointment and 6 Minute walk test. Patient also informed of patient health questionnaires on My Chart. Patient Verbalizes understanding. Visit diagnosis can be found in [facility] 02/03/2023. Completed and gym orientation. Initial ITP created and sent for review to Dr. Jinny Sanders, Medical Director. 30 Day review completed. Medical Director ITP review done, changes made as directed, and signed approval by Medical Director.   new to program First full day of exercise!  Patient was oriented to gym and equipment including functions, settings, policies, and procedures.  Patient's individual exercise prescription and treatment plan were reviewed.  All starting workloads were established based on the results of the 6 minute walk test done at initial orientation visit.  The plan for exercise progression was also introduced and progression will be customized based on patient's performance and goals. 30 Day review completed. Medical Director ITP review done, changes made as directed, and signed approval by Medical Director.    new to program    Row Name 05/06/23 0925           ITP Comments 30 Day review completed. Medical Director ITP review done, changes made as directed, and signed approval by Medical Director.                Comments: Discharge ITP

## 2023-07-29 DIAGNOSIS — Z794 Long term (current) use of insulin: Secondary | ICD-10-CM | POA: Diagnosis not present

## 2023-07-29 DIAGNOSIS — E7800 Pure hypercholesterolemia, unspecified: Secondary | ICD-10-CM | POA: Diagnosis not present

## 2023-07-29 DIAGNOSIS — E1122 Type 2 diabetes mellitus with diabetic chronic kidney disease: Secondary | ICD-10-CM | POA: Diagnosis not present

## 2023-07-29 DIAGNOSIS — I10 Essential (primary) hypertension: Secondary | ICD-10-CM | POA: Diagnosis not present

## 2023-07-29 DIAGNOSIS — N184 Chronic kidney disease, stage 4 (severe): Secondary | ICD-10-CM | POA: Diagnosis not present

## 2023-07-29 DIAGNOSIS — E039 Hypothyroidism, unspecified: Secondary | ICD-10-CM | POA: Diagnosis not present

## 2023-08-05 DIAGNOSIS — Z0000 Encounter for general adult medical examination without abnormal findings: Secondary | ICD-10-CM | POA: Diagnosis not present

## 2023-08-05 DIAGNOSIS — Z794 Long term (current) use of insulin: Secondary | ICD-10-CM | POA: Diagnosis not present

## 2023-08-05 DIAGNOSIS — E039 Hypothyroidism, unspecified: Secondary | ICD-10-CM | POA: Diagnosis not present

## 2023-08-05 DIAGNOSIS — I129 Hypertensive chronic kidney disease with stage 1 through stage 4 chronic kidney disease, or unspecified chronic kidney disease: Secondary | ICD-10-CM | POA: Diagnosis not present

## 2023-08-05 DIAGNOSIS — E1122 Type 2 diabetes mellitus with diabetic chronic kidney disease: Secondary | ICD-10-CM | POA: Diagnosis not present

## 2023-08-05 DIAGNOSIS — N184 Chronic kidney disease, stage 4 (severe): Secondary | ICD-10-CM | POA: Diagnosis not present

## 2023-08-05 DIAGNOSIS — I2510 Atherosclerotic heart disease of native coronary artery without angina pectoris: Secondary | ICD-10-CM | POA: Diagnosis not present

## 2023-08-13 DIAGNOSIS — E875 Hyperkalemia: Secondary | ICD-10-CM | POA: Diagnosis not present

## 2023-08-13 DIAGNOSIS — R609 Edema, unspecified: Secondary | ICD-10-CM | POA: Diagnosis not present

## 2023-08-13 DIAGNOSIS — I509 Heart failure, unspecified: Secondary | ICD-10-CM | POA: Diagnosis not present

## 2023-08-13 DIAGNOSIS — I10 Essential (primary) hypertension: Secondary | ICD-10-CM | POA: Diagnosis not present

## 2023-08-13 DIAGNOSIS — E1122 Type 2 diabetes mellitus with diabetic chronic kidney disease: Secondary | ICD-10-CM | POA: Diagnosis not present

## 2023-08-13 DIAGNOSIS — I4821 Permanent atrial fibrillation: Secondary | ICD-10-CM | POA: Diagnosis not present

## 2023-08-13 DIAGNOSIS — N2581 Secondary hyperparathyroidism of renal origin: Secondary | ICD-10-CM | POA: Diagnosis not present

## 2023-08-13 DIAGNOSIS — E785 Hyperlipidemia, unspecified: Secondary | ICD-10-CM | POA: Diagnosis not present

## 2023-08-13 DIAGNOSIS — N184 Chronic kidney disease, stage 4 (severe): Secondary | ICD-10-CM | POA: Diagnosis not present

## 2023-08-18 DIAGNOSIS — D631 Anemia in chronic kidney disease: Secondary | ICD-10-CM | POA: Diagnosis not present

## 2023-08-18 DIAGNOSIS — E785 Hyperlipidemia, unspecified: Secondary | ICD-10-CM | POA: Diagnosis not present

## 2023-08-18 DIAGNOSIS — I10 Essential (primary) hypertension: Secondary | ICD-10-CM | POA: Diagnosis not present

## 2023-08-18 DIAGNOSIS — I4821 Permanent atrial fibrillation: Secondary | ICD-10-CM | POA: Diagnosis not present

## 2023-08-18 DIAGNOSIS — E1122 Type 2 diabetes mellitus with diabetic chronic kidney disease: Secondary | ICD-10-CM | POA: Diagnosis not present

## 2023-08-18 DIAGNOSIS — Z951 Presence of aortocoronary bypass graft: Secondary | ICD-10-CM | POA: Diagnosis not present

## 2023-08-18 DIAGNOSIS — I509 Heart failure, unspecified: Secondary | ICD-10-CM | POA: Diagnosis not present

## 2023-08-18 DIAGNOSIS — N184 Chronic kidney disease, stage 4 (severe): Secondary | ICD-10-CM | POA: Diagnosis not present

## 2023-08-18 DIAGNOSIS — E875 Hyperkalemia: Secondary | ICD-10-CM | POA: Diagnosis not present

## 2023-09-14 DIAGNOSIS — I25119 Atherosclerotic heart disease of native coronary artery with unspecified angina pectoris: Secondary | ICD-10-CM | POA: Diagnosis not present

## 2023-09-14 DIAGNOSIS — E7800 Pure hypercholesterolemia, unspecified: Secondary | ICD-10-CM | POA: Diagnosis not present

## 2023-09-14 DIAGNOSIS — N184 Chronic kidney disease, stage 4 (severe): Secondary | ICD-10-CM | POA: Diagnosis not present

## 2023-09-14 DIAGNOSIS — I10 Essential (primary) hypertension: Secondary | ICD-10-CM | POA: Diagnosis not present

## 2023-09-14 DIAGNOSIS — E1122 Type 2 diabetes mellitus with diabetic chronic kidney disease: Secondary | ICD-10-CM | POA: Diagnosis not present

## 2023-09-24 DIAGNOSIS — N184 Chronic kidney disease, stage 4 (severe): Secondary | ICD-10-CM | POA: Diagnosis not present

## 2023-09-24 DIAGNOSIS — Z9889 Other specified postprocedural states: Secondary | ICD-10-CM | POA: Diagnosis not present

## 2023-09-24 DIAGNOSIS — Z951 Presence of aortocoronary bypass graft: Secondary | ICD-10-CM | POA: Diagnosis not present

## 2023-09-24 DIAGNOSIS — I25119 Atherosclerotic heart disease of native coronary artery with unspecified angina pectoris: Secondary | ICD-10-CM | POA: Diagnosis not present

## 2023-09-24 DIAGNOSIS — I10 Essential (primary) hypertension: Secondary | ICD-10-CM | POA: Diagnosis not present

## 2023-09-24 DIAGNOSIS — I5022 Chronic systolic (congestive) heart failure: Secondary | ICD-10-CM | POA: Diagnosis not present

## 2023-09-24 DIAGNOSIS — E7800 Pure hypercholesterolemia, unspecified: Secondary | ICD-10-CM | POA: Diagnosis not present

## 2023-09-24 DIAGNOSIS — E1122 Type 2 diabetes mellitus with diabetic chronic kidney disease: Secondary | ICD-10-CM | POA: Diagnosis not present

## 2023-09-24 DIAGNOSIS — Z8679 Personal history of other diseases of the circulatory system: Secondary | ICD-10-CM | POA: Diagnosis not present

## 2023-10-05 DIAGNOSIS — H52223 Regular astigmatism, bilateral: Secondary | ICD-10-CM | POA: Diagnosis not present

## 2023-10-06 DIAGNOSIS — H18511 Endothelial corneal dystrophy, right eye: Secondary | ICD-10-CM | POA: Diagnosis not present

## 2023-10-06 DIAGNOSIS — Z01818 Encounter for other preprocedural examination: Secondary | ICD-10-CM | POA: Diagnosis not present

## 2023-10-22 DIAGNOSIS — E7800 Pure hypercholesterolemia, unspecified: Secondary | ICD-10-CM | POA: Diagnosis not present

## 2023-10-22 DIAGNOSIS — I10 Essential (primary) hypertension: Secondary | ICD-10-CM | POA: Diagnosis not present

## 2023-10-22 DIAGNOSIS — Z794 Long term (current) use of insulin: Secondary | ICD-10-CM | POA: Diagnosis not present

## 2023-10-22 DIAGNOSIS — E1122 Type 2 diabetes mellitus with diabetic chronic kidney disease: Secondary | ICD-10-CM | POA: Diagnosis not present

## 2023-10-22 DIAGNOSIS — N184 Chronic kidney disease, stage 4 (severe): Secondary | ICD-10-CM | POA: Diagnosis not present

## 2023-11-04 DIAGNOSIS — N189 Chronic kidney disease, unspecified: Secondary | ICD-10-CM | POA: Diagnosis not present

## 2023-11-04 DIAGNOSIS — H18511 Endothelial corneal dystrophy, right eye: Secondary | ICD-10-CM | POA: Diagnosis not present

## 2023-11-04 DIAGNOSIS — E1122 Type 2 diabetes mellitus with diabetic chronic kidney disease: Secondary | ICD-10-CM | POA: Diagnosis not present

## 2023-11-04 DIAGNOSIS — I129 Hypertensive chronic kidney disease with stage 1 through stage 4 chronic kidney disease, or unspecified chronic kidney disease: Secondary | ICD-10-CM | POA: Diagnosis not present

## 2023-12-08 DIAGNOSIS — E1122 Type 2 diabetes mellitus with diabetic chronic kidney disease: Secondary | ICD-10-CM | POA: Diagnosis not present

## 2023-12-08 DIAGNOSIS — Z794 Long term (current) use of insulin: Secondary | ICD-10-CM | POA: Diagnosis not present

## 2023-12-08 DIAGNOSIS — N184 Chronic kidney disease, stage 4 (severe): Secondary | ICD-10-CM | POA: Diagnosis not present

## 2023-12-08 DIAGNOSIS — E7800 Pure hypercholesterolemia, unspecified: Secondary | ICD-10-CM | POA: Diagnosis not present

## 2024-01-28 DIAGNOSIS — E039 Hypothyroidism, unspecified: Secondary | ICD-10-CM | POA: Diagnosis not present

## 2024-01-28 DIAGNOSIS — N184 Chronic kidney disease, stage 4 (severe): Secondary | ICD-10-CM | POA: Diagnosis not present

## 2024-01-28 DIAGNOSIS — E1122 Type 2 diabetes mellitus with diabetic chronic kidney disease: Secondary | ICD-10-CM | POA: Diagnosis not present

## 2024-01-28 DIAGNOSIS — Z794 Long term (current) use of insulin: Secondary | ICD-10-CM | POA: Diagnosis not present

## 2024-01-28 DIAGNOSIS — I25119 Atherosclerotic heart disease of native coronary artery with unspecified angina pectoris: Secondary | ICD-10-CM | POA: Diagnosis not present

## 2024-02-04 DIAGNOSIS — N184 Chronic kidney disease, stage 4 (severe): Secondary | ICD-10-CM | POA: Diagnosis not present

## 2024-02-04 DIAGNOSIS — I130 Hypertensive heart and chronic kidney disease with heart failure and stage 1 through stage 4 chronic kidney disease, or unspecified chronic kidney disease: Secondary | ICD-10-CM | POA: Diagnosis not present

## 2024-02-04 DIAGNOSIS — Z794 Long term (current) use of insulin: Secondary | ICD-10-CM | POA: Diagnosis not present

## 2024-02-04 DIAGNOSIS — R399 Unspecified symptoms and signs involving the genitourinary system: Secondary | ICD-10-CM | POA: Diagnosis not present

## 2024-02-04 DIAGNOSIS — I2510 Atherosclerotic heart disease of native coronary artery without angina pectoris: Secondary | ICD-10-CM | POA: Diagnosis not present

## 2024-02-04 DIAGNOSIS — E1122 Type 2 diabetes mellitus with diabetic chronic kidney disease: Secondary | ICD-10-CM | POA: Diagnosis not present

## 2024-02-04 DIAGNOSIS — I5022 Chronic systolic (congestive) heart failure: Secondary | ICD-10-CM | POA: Diagnosis not present

## 2024-02-04 DIAGNOSIS — E039 Hypothyroidism, unspecified: Secondary | ICD-10-CM | POA: Diagnosis not present

## 2024-02-29 DIAGNOSIS — H18511 Endothelial corneal dystrophy, right eye: Secondary | ICD-10-CM | POA: Diagnosis not present

## 2024-02-29 DIAGNOSIS — H18423 Band keratopathy, bilateral: Secondary | ICD-10-CM | POA: Diagnosis not present

## 2024-03-01 DIAGNOSIS — E785 Hyperlipidemia, unspecified: Secondary | ICD-10-CM | POA: Diagnosis not present

## 2024-03-01 DIAGNOSIS — I10 Essential (primary) hypertension: Secondary | ICD-10-CM | POA: Diagnosis not present

## 2024-03-01 DIAGNOSIS — E1122 Type 2 diabetes mellitus with diabetic chronic kidney disease: Secondary | ICD-10-CM | POA: Diagnosis not present

## 2024-03-01 DIAGNOSIS — N2581 Secondary hyperparathyroidism of renal origin: Secondary | ICD-10-CM | POA: Diagnosis not present

## 2024-03-01 DIAGNOSIS — I4821 Permanent atrial fibrillation: Secondary | ICD-10-CM | POA: Diagnosis not present

## 2024-03-01 DIAGNOSIS — I509 Heart failure, unspecified: Secondary | ICD-10-CM | POA: Diagnosis not present

## 2024-03-01 DIAGNOSIS — R609 Edema, unspecified: Secondary | ICD-10-CM | POA: Diagnosis not present

## 2024-03-01 DIAGNOSIS — E875 Hyperkalemia: Secondary | ICD-10-CM | POA: Diagnosis not present

## 2024-03-01 DIAGNOSIS — N184 Chronic kidney disease, stage 4 (severe): Secondary | ICD-10-CM | POA: Diagnosis not present

## 2024-03-07 DIAGNOSIS — E1122 Type 2 diabetes mellitus with diabetic chronic kidney disease: Secondary | ICD-10-CM | POA: Diagnosis not present

## 2024-03-07 DIAGNOSIS — N183 Chronic kidney disease, stage 3 unspecified: Secondary | ICD-10-CM | POA: Diagnosis not present

## 2024-03-07 DIAGNOSIS — I129 Hypertensive chronic kidney disease with stage 1 through stage 4 chronic kidney disease, or unspecified chronic kidney disease: Secondary | ICD-10-CM | POA: Diagnosis not present

## 2024-03-07 DIAGNOSIS — E1165 Type 2 diabetes mellitus with hyperglycemia: Secondary | ICD-10-CM | POA: Diagnosis not present

## 2024-03-07 DIAGNOSIS — Z794 Long term (current) use of insulin: Secondary | ICD-10-CM | POA: Diagnosis not present

## 2024-03-08 DIAGNOSIS — I509 Heart failure, unspecified: Secondary | ICD-10-CM | POA: Diagnosis not present

## 2024-03-08 DIAGNOSIS — D631 Anemia in chronic kidney disease: Secondary | ICD-10-CM | POA: Diagnosis not present

## 2024-03-08 DIAGNOSIS — R609 Edema, unspecified: Secondary | ICD-10-CM | POA: Diagnosis not present

## 2024-03-08 DIAGNOSIS — I10 Essential (primary) hypertension: Secondary | ICD-10-CM | POA: Diagnosis not present

## 2024-03-08 DIAGNOSIS — E785 Hyperlipidemia, unspecified: Secondary | ICD-10-CM | POA: Diagnosis not present

## 2024-03-08 DIAGNOSIS — E1122 Type 2 diabetes mellitus with diabetic chronic kidney disease: Secondary | ICD-10-CM | POA: Diagnosis not present

## 2024-03-08 DIAGNOSIS — I4821 Permanent atrial fibrillation: Secondary | ICD-10-CM | POA: Diagnosis not present

## 2024-03-08 DIAGNOSIS — N184 Chronic kidney disease, stage 4 (severe): Secondary | ICD-10-CM | POA: Diagnosis not present

## 2024-03-08 DIAGNOSIS — Z951 Presence of aortocoronary bypass graft: Secondary | ICD-10-CM | POA: Diagnosis not present

## 2024-03-24 DIAGNOSIS — E1122 Type 2 diabetes mellitus with diabetic chronic kidney disease: Secondary | ICD-10-CM | POA: Diagnosis not present

## 2024-03-24 DIAGNOSIS — I25119 Atherosclerotic heart disease of native coronary artery with unspecified angina pectoris: Secondary | ICD-10-CM | POA: Diagnosis not present

## 2024-03-24 DIAGNOSIS — I700 Atherosclerosis of aorta: Secondary | ICD-10-CM | POA: Diagnosis not present

## 2024-03-24 DIAGNOSIS — E7800 Pure hypercholesterolemia, unspecified: Secondary | ICD-10-CM | POA: Diagnosis not present

## 2024-03-24 DIAGNOSIS — I10 Essential (primary) hypertension: Secondary | ICD-10-CM | POA: Diagnosis not present

## 2024-03-24 DIAGNOSIS — N184 Chronic kidney disease, stage 4 (severe): Secondary | ICD-10-CM | POA: Diagnosis not present

## 2024-03-24 DIAGNOSIS — Z951 Presence of aortocoronary bypass graft: Secondary | ICD-10-CM | POA: Diagnosis not present

## 2024-03-24 DIAGNOSIS — Z9889 Other specified postprocedural states: Secondary | ICD-10-CM | POA: Diagnosis not present

## 2024-03-24 DIAGNOSIS — Z8679 Personal history of other diseases of the circulatory system: Secondary | ICD-10-CM | POA: Diagnosis not present

## 2024-04-04 DIAGNOSIS — I25119 Atherosclerotic heart disease of native coronary artery with unspecified angina pectoris: Secondary | ICD-10-CM | POA: Diagnosis not present

## 2024-04-04 DIAGNOSIS — Z951 Presence of aortocoronary bypass graft: Secondary | ICD-10-CM | POA: Diagnosis not present

## 2024-04-04 DIAGNOSIS — I5020 Unspecified systolic (congestive) heart failure: Secondary | ICD-10-CM | POA: Diagnosis not present

## 2024-04-11 DIAGNOSIS — H18511 Endothelial corneal dystrophy, right eye: Secondary | ICD-10-CM | POA: Diagnosis not present

## 2024-04-11 DIAGNOSIS — H18423 Band keratopathy, bilateral: Secondary | ICD-10-CM | POA: Diagnosis not present

## 2024-04-18 DIAGNOSIS — Z794 Long term (current) use of insulin: Secondary | ICD-10-CM | POA: Diagnosis not present

## 2024-04-18 DIAGNOSIS — E1122 Type 2 diabetes mellitus with diabetic chronic kidney disease: Secondary | ICD-10-CM | POA: Diagnosis not present

## 2024-04-18 DIAGNOSIS — N1831 Chronic kidney disease, stage 3a: Secondary | ICD-10-CM | POA: Diagnosis not present

## 2024-04-18 DIAGNOSIS — I5020 Unspecified systolic (congestive) heart failure: Secondary | ICD-10-CM | POA: Diagnosis not present

## 2024-04-18 DIAGNOSIS — E7800 Pure hypercholesterolemia, unspecified: Secondary | ICD-10-CM | POA: Diagnosis not present

## 2024-06-28 NOTE — Progress Notes (Unsigned)
 " Cardiology Office Note   Date:  06/30/2024  ID:  Mckenzie Wright, Mckenzie Wright 1945/06/28, MRN [id number] PCP: Lenon Layman ORN, MD  [facility] Providers Cardiologist:  Caron Poser, MD     History of Present Illness Mckenzie Wright is a 79 y.o. female PMH DM2, hypothyroidism, HLD, CKD 3B, CAD status post CABG 03/2021, atrial fibrillation status post left atrial appendage ligation at time of CABG 03/2021, HFrEF secondary to ischemic cardiomyopathy who presents to establish care.  Patient is here to establish care.  She reports intermittent chest tightness and fatigue.  Denies any orthopnea or significant lower extremity edema.  No palpitations or syncope.  No other obvious cardiac symptoms.  Last LDL 31 01/2024.  Relevant CVD History -TTE 03/2024 LVEF 40%, normal RV size and function, mild MR, moderate TR with PASP 41 mmHg - TTE 06/2022 LVEF 40 to 45%, normal RV size and function, mild TR, mild MR - TTE 01/2022 LVEF 45%, normal RV size and function, mild TR, mild MR - Normal SPECT 01/2022 - CABG and LAA ligation 03/2021 (LIMA-LAD, VG-PDA, VG-ramus)   ROS: Pt denies any chest discomfort, jaw pain, arm pain, palpitations, syncope, presyncope, orthopnea, PND, or LE edema.  Studies Reviewed I have independently reviewed the patient's ECG, previous cardiac testing, previous medical records, previous blood work.  Physical Exam VS:  BP 102/70 (BP Location: Left Arm, Patient Position: Sitting, Cuff Size: Normal)   Pulse 98   Ht 5' 6 (1.676 m)   Wt 146 lb (66.2 kg)   SpO2 98%   BMI 23.57 kg/m        Wt Readings from Last 3 Encounters:  06/30/24 146 lb (66.2 kg)  03/12/23 166 lb 12.8 oz (75.7 kg)  10/18/22 165 lb (74.8 kg)    GEN: No acute distress. NECK: No JVD; No carotid bruits. CARDIAC: RRR, no murmurs, rubs, gallops. RESPIRATORY:  Clear to auscultation. EXTREMITIES:  Warm and well-perfused. No edema.  ASSESSMENT AND PLAN Chest discomfort Fatigue CAD status post CABG  03/2021 Ischemic cardiomyopathy Patient presents with nonspecific chest discomfort and nonspecific fatigue.  She does have mildly reduced EF which has been stable since before her CABG.  Symptoms are nonspecific, somewhat unclear if they are cardiac in etiology.  She had a normal stress SPECT back in 2023.  Plan: - Stress PET to the further stratify chest discomfort - Continue ASA 81 mg daily, Lipitor 80 mg daily, Zetia 10 mg daily - Continue metoprolol  to tartrate 12.5 mg twice daily - Continue Jardiance - Continue torsemide 20 mg daily as needed - Not on ACE/ARB/ARNI/MRA due to CKD and azotemia in the past.  Will recheck a BMP today and we may try to sneak on a low-dose of MRA.  If she has azotemia following this, then we will not make further attempts  Paroxysmal atrial fibrillation status post LAA ligation 03/2021 CHA2DS2-VASc of at least 6.  Not on Eliquis per previous provider since 2022.  Has not had any documented atrial fibrillation since 2022.  In sinus rhythm today.  Will continue to reassess need for [facility] at subsequent appointments, though would favor starting if no contraindications given CHA2DS2-VASc score.  HLD Well-controlled.  Last LDL 31 01/2024.  Continue Lipitor 80 mg daily and Zetia 10 mg daily.     Informed Consent   The risks [chest pain, shortness of breath, cardiac arrhythmias, dizziness, blood pressure fluctuations, myocardial infarction, stroke/transient ischemic attack, nausea, vomiting, allergic reaction, radiation exposure, metallic taste sensation and life-threatening complications (  estimated to be 1 in 10,000)], benefits (risk stratification, diagnosing coronary artery disease, treatment guidance) and alternatives of a cardiac PET stress test were discussed in detail with Mckenzie Wright and she agrees to proceed.     Dispo: RTC 3 months or sooner as needed  Signed, Caron Poser, MD  "

## 2024-06-30 ENCOUNTER — Ambulatory Visit

## 2024-06-30 VITALS — BP 102/70 | HR 98 | Ht 66.0 in | Wt 146.0 lb

## 2024-06-30 DIAGNOSIS — I25119 Atherosclerotic heart disease of native coronary artery with unspecified angina pectoris: Secondary | ICD-10-CM | POA: Diagnosis not present

## 2024-06-30 DIAGNOSIS — I255 Ischemic cardiomyopathy: Secondary | ICD-10-CM

## 2024-06-30 DIAGNOSIS — Z9889 Other specified postprocedural states: Secondary | ICD-10-CM | POA: Diagnosis not present

## 2024-06-30 DIAGNOSIS — Z951 Presence of aortocoronary bypass graft: Secondary | ICD-10-CM | POA: Diagnosis not present

## 2024-06-30 DIAGNOSIS — I480 Paroxysmal atrial fibrillation: Secondary | ICD-10-CM

## 2024-06-30 DIAGNOSIS — E782 Mixed hyperlipidemia: Secondary | ICD-10-CM

## 2024-06-30 DIAGNOSIS — Z79899 Other long term (current) drug therapy: Secondary | ICD-10-CM

## 2024-06-30 DIAGNOSIS — I700 Atherosclerosis of aorta: Secondary | ICD-10-CM | POA: Diagnosis not present

## 2024-06-30 NOTE — Patient Instructions (Signed)
 Medication Instructions:  Your physician recommends that you continue on your current medications as directed. Please refer to the Current Medication list given to you today.  *If you need a refill on your cardiac medications before your next appointment, please call your pharmacy*  Lab Work: Your provider would like for you to have following labs drawn today BMET.   If you have labs (blood work) drawn today and your tests are completely normal, you will receive your results only by: MyChart Message (if you have MyChart) OR A paper copy in the mail If you have any lab test that is abnormal or we need to change your treatment, we will call you to review the results.  Testing/Procedures:    Please report to Radiology at the [facility] Main Entrance 30 minutes early for your test.  [address] [place], [place] [address]                         OR   Please report to Radiology at [facility] Main Entrance, medical mall, 30 mins prior to your test.  [address]  [place], [place]  How to Prepare for Your Cardiac PET/CT Stress Test:  Nothing to eat or drink, except water, 3 hours prior to arrival time.  NO caffeine/decaffeinated products, or chocolate 12 hours prior to arrival. (Please note decaffeinated beverages (teas/coffees) still contain caffeine).  If you have caffeine within 12 hours prior, the test will need to be rescheduled.  Medication instructions:  Diabetic Preparation: If able to eat breakfast prior to 3 hour fasting, you may take all medications, including your insulin . Do not worry if you miss your breakfast dose of insulin  - start at your next meal. If you do not eat prior to 3 hour fast-Hold all diabetes (oral and insulin ) medications. Patients who wear a continuous glucose monitor MUST remove the device prior to scanning.    You may take your medications with water.  However, we recommend holding torsemide (DEMADEX) until  after procedure due to scanner cannot be stopped for bathroom break   NO perfume, cologne or lotion on chest or abdomen area. FEMALES - Please avoid wearing dresses to this appointment.  Total time is 1 to 2 hours; you may want to bring reading material for the waiting time.   In preparation for your appointment, medication and supplies will be purchased.  Appointment availability is limited, so if you need to cancel or reschedule, please call the Radiology Department Scheduler at [phone] 24 hours in advance to avoid a cancellation fee of $100.00  What to Expect When you Arrive:  Once you arrive and check in for your appointment, you will be taken to a preparation room within the Radiology Department.  A technologist or Nurse will obtain your medical history, verify that you are correctly prepped for the exam, and explain the procedure.  Afterwards, an IV will be started in your arm and electrodes will be placed on your skin for EKG monitoring during the stress portion of the exam. Then you will be escorted to the PET/CT scanner.  There, staff will get you positioned on the scanner and obtain a blood pressure and EKG.  During the exam, you will continue to be connected to the EKG and blood pressure machines.  A small, safe amount of a radioactive tracer will be injected in your IV to obtain a series of pictures of your heart along with an  injection of a stress agent.    After your Exam:  It is recommended that you eat a meal and drink a caffeinated beverage to counter act any effects of the stress agent.  Drink plenty of fluids for the remainder of the day and urinate frequently for the first couple of hours after the exam.  Your doctor will inform you of your test results within 7-10 business days.  For more information and frequently asked questions, please visit our website: [email or web address]  For questions about your test or how to prepare for your test, please  call: Cardiac Imaging Nurse Navigators Office: [phone]   Follow-Up: At [facility] you and your health needs are our priority.  As part of our continuing mission to provide you with exceptional heart care, our providers are all part of one team.  This team includes your primary Cardiologist (physician) and Advanced Practice Providers or APPs (Physician Assistants and Nurse Practitioners) who all work together to provide you with the care you need, when you need it.  Your next appointment:  3 month(s)  Provider:  Caron Poser, MD    We recommend signing up for the patient portal called MyChart.  Sign up information is provided on this After Visit Summary.  MyChart is used to connect with patients for Virtual Visits (Telemedicine).  Patients are able to view lab/test results, encounter notes, upcoming appointments, etc.  Non-urgent messages can be sent to your provider as well.   To learn more about what you can do with MyChart, go to [email or web address].

## 2024-07-01 ENCOUNTER — Ambulatory Visit: Payer: Self-pay

## 2024-07-01 LAB — BASIC METABOLIC PANEL WITH GFR
BUN/Creatinine Ratio: 14 (ref 12–28)
BUN: 20 mg/dL (ref 8–27)
CO2: 22 mmol/L (ref 20–29)
Calcium: 9.5 mg/dL (ref 8.7–10.3)
Chloride: 102 mmol/L (ref 96–106)
Creatinine, Ser: 1.43 mg/dL — ABNORMAL HIGH (ref 0.57–1.00)
Glucose: 139 mg/dL — ABNORMAL HIGH (ref 70–99)
Potassium: 4.9 mmol/L (ref 3.5–5.2)
Sodium: 142 mmol/L (ref 134–144)
eGFR: 37 mL/min/1.73 — ABNORMAL LOW

## 2024-07-06 ENCOUNTER — Other Ambulatory Visit: Payer: Self-pay | Admitting: *Deleted

## 2024-07-20 ENCOUNTER — Encounter (HOSPITAL_COMMUNITY): Payer: Self-pay

## 2024-07-21 ENCOUNTER — Ambulatory Visit: Admission: RE | Admit: 2024-07-21 | Source: Ambulatory Visit

## 2024-07-21 DIAGNOSIS — I700 Atherosclerosis of aorta: Secondary | ICD-10-CM

## 2024-07-21 DIAGNOSIS — I480 Paroxysmal atrial fibrillation: Secondary | ICD-10-CM

## 2024-07-21 DIAGNOSIS — Z9889 Other specified postprocedural states: Secondary | ICD-10-CM

## 2024-07-21 DIAGNOSIS — Z951 Presence of aortocoronary bypass graft: Secondary | ICD-10-CM

## 2024-07-21 DIAGNOSIS — E782 Mixed hyperlipidemia: Secondary | ICD-10-CM

## 2024-07-21 DIAGNOSIS — I25119 Atherosclerotic heart disease of native coronary artery with unspecified angina pectoris: Secondary | ICD-10-CM

## 2024-07-21 DIAGNOSIS — I255 Ischemic cardiomyopathy: Secondary | ICD-10-CM

## 2024-07-21 LAB — NM PET CT CARDIAC PERFUSION MULTI W/ABSOLUTE BLOODFLOW
LV dias vol: 77 mL (ref 46–106)
LV sys vol: 28 mL
Nuc Rest EF: 55 %
Nuc Stress EF: 64 %
Peak HR: 88 {beats}/min
Rest HR: 72 {beats}/min
Rest Nuclear Isotope Dose: 17.2 mCi
SRS: 11
SSS: 8
Stress Nuclear Isotope Dose: 17.4 mCi
TID: 1

## 2024-07-21 MED ORDER — REGADENOSON 0.4 MG/5ML IV SOLN
0.4000 mg | Freq: Once | INTRAVENOUS | Status: AC
  Administered 2024-07-21: 0.4 mg via INTRAVENOUS
  Filled 2024-07-21: qty 5

## 2024-07-21 MED ORDER — RUBIDIUM RB82 GENERATOR (RUBYFILL)
25.0000 | PACK | Freq: Once | INTRAVENOUS | Status: AC
  Administered 2024-07-21: 17.4 via INTRAVENOUS

## 2024-07-21 MED ORDER — RUBIDIUM RB82 GENERATOR (RUBYFILL)
25.0000 | PACK | Freq: Once | INTRAVENOUS | Status: AC
  Administered 2024-07-21: 17.24 via INTRAVENOUS

## 2024-07-21 MED ORDER — REGADENOSON 0.4 MG/5ML IV SOLN
INTRAVENOUS | Status: AC
  Filled 2024-07-21: qty 5

## 2024-09-29 ENCOUNTER — Ambulatory Visit
